# Patient Record
Sex: Female | Born: 1955
Health system: Southern US, Community
[De-identification: ages and names within clinical notes are randomized; demographics above are authoritative.]

## PROBLEM LIST (undated history)

## (undated) DIAGNOSIS — Z889 Allergy status to unspecified drugs, medicaments and biological substances status: Secondary | ICD-10-CM

## (undated) DIAGNOSIS — T7840XA Allergy, unspecified, initial encounter: Secondary | ICD-10-CM

## (undated) DIAGNOSIS — I10 Essential (primary) hypertension: Secondary | ICD-10-CM

## (undated) DIAGNOSIS — H269 Unspecified cataract: Secondary | ICD-10-CM

## (undated) DIAGNOSIS — K649 Unspecified hemorrhoids: Secondary | ICD-10-CM

## (undated) DIAGNOSIS — F32A Depression, unspecified: Secondary | ICD-10-CM

## (undated) DIAGNOSIS — Z79890 Hormone replacement therapy (postmenopausal): Secondary | ICD-10-CM

## (undated) DIAGNOSIS — M1990 Unspecified osteoarthritis, unspecified site: Secondary | ICD-10-CM

## (undated) DIAGNOSIS — F329 Major depressive disorder, single episode, unspecified: Secondary | ICD-10-CM

## (undated) DIAGNOSIS — F419 Anxiety disorder, unspecified: Secondary | ICD-10-CM

## (undated) DIAGNOSIS — M8580 Other specified disorders of bone density and structure, unspecified site: Secondary | ICD-10-CM

## (undated) DIAGNOSIS — K219 Gastro-esophageal reflux disease without esophagitis: Secondary | ICD-10-CM

## (undated) DIAGNOSIS — F40240 Claustrophobia: Secondary | ICD-10-CM

## (undated) HISTORY — DX: Hormone replacement therapy: Z79.890

## (undated) HISTORY — DX: Other specified disorders of bone density and structure, unspecified site: M85.80

## (undated) HISTORY — PX: COLONOSCOPY: SHX174

## (undated) HISTORY — DX: Allergy, unspecified, initial encounter: T78.40XA

## (undated) HISTORY — DX: Anxiety disorder, unspecified: F41.9

## (undated) HISTORY — DX: Unspecified cataract: H26.9

## (undated) HISTORY — DX: Gastro-esophageal reflux disease without esophagitis: K21.9

## (undated) HISTORY — DX: Essential (primary) hypertension: I10

## (undated) HISTORY — DX: Depression, unspecified: F32.A

## (undated) HISTORY — DX: Unspecified osteoarthritis, unspecified site: M19.90

## (undated) HISTORY — PX: POLYPECTOMY: SHX149

## (undated) HISTORY — DX: Major depressive disorder, single episode, unspecified: F32.9

## (undated) HISTORY — DX: Unspecified hemorrhoids: K64.9

## (undated) HISTORY — DX: Claustrophobia: F40.240

## (undated) HISTORY — PX: WISDOM TOOTH EXTRACTION: SHX21

## (undated) HISTORY — DX: Allergy status to unspecified drugs, medicaments and biological substances: Z88.9

---

## 2003-05-31 ENCOUNTER — Encounter: Admission: RE | Admit: 2003-05-31 | Discharge: 2003-05-31 | Payer: Self-pay | Admitting: Family Medicine

## 2003-05-31 ENCOUNTER — Encounter: Payer: Self-pay | Admitting: Family Medicine

## 2004-07-21 ENCOUNTER — Other Ambulatory Visit: Admission: RE | Admit: 2004-07-21 | Discharge: 2004-07-21 | Payer: Self-pay | Admitting: Family Medicine

## 2005-08-17 ENCOUNTER — Other Ambulatory Visit: Admission: RE | Admit: 2005-08-17 | Discharge: 2005-08-17 | Payer: Self-pay | Admitting: Physician Assistant

## 2006-09-12 ENCOUNTER — Ambulatory Visit: Payer: Self-pay | Admitting: Internal Medicine

## 2006-09-20 ENCOUNTER — Other Ambulatory Visit: Admission: RE | Admit: 2006-09-20 | Discharge: 2006-09-20 | Payer: Self-pay | Admitting: Internal Medicine

## 2006-09-20 ENCOUNTER — Ambulatory Visit: Payer: Self-pay | Admitting: Internal Medicine

## 2006-10-10 ENCOUNTER — Ambulatory Visit: Payer: Self-pay | Admitting: Gastroenterology

## 2006-10-21 ENCOUNTER — Encounter (INDEPENDENT_AMBULATORY_CARE_PROVIDER_SITE_OTHER): Payer: Self-pay | Admitting: Specialist

## 2006-10-21 ENCOUNTER — Ambulatory Visit: Payer: Self-pay | Admitting: Gastroenterology

## 2006-10-21 LAB — HM COLONOSCOPY

## 2006-11-29 ENCOUNTER — Ambulatory Visit: Payer: Self-pay | Admitting: Internal Medicine

## 2006-12-30 ENCOUNTER — Encounter: Payer: Self-pay | Admitting: Pulmonary Disease

## 2006-12-30 ENCOUNTER — Ambulatory Visit: Payer: Self-pay | Admitting: Internal Medicine

## 2006-12-30 LAB — CONVERTED CEMR LAB
Alkaline Phosphatase: 43 units/L (ref 39–117)
Anti Nuclear Antibody(ANA): NEGATIVE
BUN: 12 mg/dL (ref 6–23)
Basophils Relative: 0.9 % (ref 0.0–1.0)
Chloride: 106 meq/L (ref 96–112)
Creatinine, Ser: 0.9 mg/dL (ref 0.4–1.2)
Eosinophil percent: 1.4 % (ref 0.0–5.0)
Glomerular Filtration Rate, Af Am: 85 mL/min/{1.73_m2}
Glucose, Bld: 105 mg/dL — ABNORMAL HIGH (ref 70–99)
HCT: 40.2 % (ref 36.0–46.0)
Hemoglobin: 13.6 g/dL (ref 12.0–15.0)
Neutrophils Relative %: 73.3 % (ref 43.0–77.0)
Platelets: 271 10*3/uL (ref 150–400)
Potassium: 3.8 meq/L (ref 3.5–5.1)
RBC: 4.44 M/uL (ref 3.87–5.11)
Sodium: 142 meq/L (ref 135–145)
Total Bilirubin: 0.6 mg/dL (ref 0.3–1.2)
Total Protein: 6.7 g/dL (ref 6.0–8.3)
WBC: 7.5 10*3/uL (ref 4.5–10.5)

## 2007-01-30 ENCOUNTER — Ambulatory Visit: Payer: Self-pay | Admitting: Internal Medicine

## 2007-05-01 ENCOUNTER — Ambulatory Visit: Payer: Self-pay | Admitting: Internal Medicine

## 2007-08-24 DIAGNOSIS — I10 Essential (primary) hypertension: Secondary | ICD-10-CM

## 2007-08-24 DIAGNOSIS — J309 Allergic rhinitis, unspecified: Secondary | ICD-10-CM | POA: Insufficient documentation

## 2007-08-24 DIAGNOSIS — F325 Major depressive disorder, single episode, in full remission: Secondary | ICD-10-CM | POA: Insufficient documentation

## 2008-06-05 ENCOUNTER — Ambulatory Visit: Payer: Self-pay | Admitting: Internal Medicine

## 2008-06-05 LAB — CONVERTED CEMR LAB
ALT: 14 units/L (ref 0–35)
Albumin: 3.8 g/dL (ref 3.5–5.2)
Alkaline Phosphatase: 39 units/L (ref 39–117)
BUN: 11 mg/dL (ref 6–23)
Basophils Absolute: 0 10*3/uL (ref 0.0–0.1)
Bilirubin, Direct: 0.1 mg/dL (ref 0.0–0.3)
Chloride: 109 meq/L (ref 96–112)
Cholesterol: 184 mg/dL (ref 0–200)
Eosinophils Absolute: 0.3 10*3/uL (ref 0.0–0.7)
Glucose, Bld: 83 mg/dL (ref 70–99)
Glucose, Urine, Semiquant: NEGATIVE
HCT: 37.8 % (ref 36.0–46.0)
HDL: 77.5 mg/dL (ref >39.0)
LDL Cholesterol: 98 mg/dL (ref 0–99)
Lymphocytes Relative: 26.1 % (ref 12.0–46.0)
Monocytes Absolute: 0.5 10*3/uL (ref 0.1–1.0)
Neutro Abs: 3.3 10*3/uL (ref 1.4–7.7)
Potassium: 4.7 meq/L (ref 3.5–5.1)
RDW: 13.2 % (ref 11.5–14.6)
Sodium: 143 meq/L (ref 135–145)
Specific Gravity, Urine: 1.015
TSH: 1.79 microintl units/mL (ref 0.35–5.50)
Total Bilirubin: 0.8 mg/dL (ref 0.3–1.2)
Total CHOL/HDL Ratio: 2.4
Total Protein: 6.2 g/dL (ref 6.0–8.3)
Triglycerides: 42 mg/dL (ref 0–149)
Urobilinogen, UA: 0.2
VLDL: 8 mg/dL (ref 0–40)
WBC: 5.5 10*3/uL (ref 4.5–10.5)
pH: 6.5

## 2008-06-13 ENCOUNTER — Ambulatory Visit: Payer: Self-pay | Admitting: Internal Medicine

## 2008-06-13 DIAGNOSIS — Z860100 Personal history of colon polyps, unspecified: Secondary | ICD-10-CM | POA: Insufficient documentation

## 2008-07-01 ENCOUNTER — Ambulatory Visit: Payer: Self-pay | Admitting: Internal Medicine

## 2008-07-01 ENCOUNTER — Other Ambulatory Visit: Admission: RE | Admit: 2008-07-01 | Discharge: 2008-07-01 | Payer: Self-pay | Admitting: Internal Medicine

## 2008-07-01 ENCOUNTER — Encounter: Payer: Self-pay | Admitting: Internal Medicine

## 2008-07-19 LAB — CONVERTED CEMR LAB: Pap Smear: NORMAL

## 2008-08-20 ENCOUNTER — Telehealth: Payer: Self-pay | Admitting: Internal Medicine

## 2008-11-18 ENCOUNTER — Ambulatory Visit: Payer: Self-pay | Admitting: Internal Medicine

## 2009-06-09 ENCOUNTER — Encounter: Payer: Self-pay | Admitting: Internal Medicine

## 2009-07-01 ENCOUNTER — Ambulatory Visit: Payer: Self-pay | Admitting: Internal Medicine

## 2009-07-01 LAB — CONVERTED CEMR LAB
AST: 18 units/L (ref 0–37)
Albumin: 3.8 g/dL (ref 3.5–5.2)
Alkaline Phosphatase: 42 units/L (ref 39–117)
Basophils Relative: 0.8 % (ref 0.0–3.0)
Bilirubin Urine: NEGATIVE
Bilirubin, Direct: 0 mg/dL (ref 0.0–0.3)
CO2: 30 meq/L (ref 19–32)
Eosinophils Absolute: 0.2 10*3/uL (ref 0.0–0.7)
Eosinophils Relative: 5.2 % — ABNORMAL HIGH (ref 0.0–5.0)
HCT: 39 % (ref 36.0–46.0)
HDL: 82 mg/dL (ref >39.00)
Hemoglobin, Urine: NEGATIVE
LDL Cholesterol: 88 mg/dL (ref 0–99)
Leukocytes, UA: NEGATIVE
MCV: 88.7 fL (ref 78.0–100.0)
Monocytes Relative: 9.5 % (ref 3.0–12.0)
Neutro Abs: 2.8 10*3/uL (ref 1.4–7.7)
Neutrophils Relative %: 56.1 % (ref 43.0–77.0)
Platelets: 222 10*3/uL (ref 150.0–400.0)
RBC: 4.4 M/uL (ref 3.87–5.11)
Specific Gravity, Urine: 1.015 (ref 1.000–1.030)
Total Bilirubin: 0.7 mg/dL (ref 0.3–1.2)
Total CHOL/HDL Ratio: 2
Total Protein, Urine: NEGATIVE mg/dL
Total Protein: 6.3 g/dL (ref 6.0–8.3)
Triglycerides: 43 mg/dL (ref 0.0–149.0)
Urine Glucose: NEGATIVE mg/dL
Urobilinogen, UA: 0.2 (ref 0.0–1.0)
VLDL: 8.6 mg/dL (ref 0.0–40.0)
WBC: 4.7 10*3/uL (ref 4.5–10.5)

## 2009-07-08 ENCOUNTER — Ambulatory Visit: Payer: Self-pay | Admitting: Internal Medicine

## 2009-07-08 ENCOUNTER — Encounter: Payer: Self-pay | Admitting: Internal Medicine

## 2009-07-08 ENCOUNTER — Other Ambulatory Visit: Admission: RE | Admit: 2009-07-08 | Discharge: 2009-07-08 | Payer: Self-pay | Admitting: Internal Medicine

## 2009-09-17 ENCOUNTER — Encounter: Admission: RE | Admit: 2009-09-17 | Discharge: 2009-09-17 | Payer: Self-pay | Admitting: Internal Medicine

## 2010-08-18 ENCOUNTER — Ambulatory Visit: Payer: Self-pay | Admitting: Internal Medicine

## 2010-08-26 ENCOUNTER — Encounter: Payer: Self-pay | Admitting: Internal Medicine

## 2010-08-28 ENCOUNTER — Ambulatory Visit: Payer: Self-pay | Admitting: Internal Medicine

## 2010-08-28 LAB — CONVERTED CEMR LAB
ALT: 11 units/L (ref 0–35)
Albumin: 4.1 g/dL (ref 3.5–5.2)
BUN: 15 mg/dL (ref 6–23)
Basophils Relative: 1 % (ref 0.0–3.0)
Bilirubin Urine: NEGATIVE
CO2: 30 meq/L (ref 19–32)
Chloride: 107 meq/L (ref 96–112)
Eosinophils Relative: 8.4 % — ABNORMAL HIGH (ref 0.0–5.0)
HCT: 39.1 % (ref 36.0–46.0)
HDL: 77.9 mg/dL (ref >39.00)
Hemoglobin, Urine: NEGATIVE
LDL Cholesterol: 97 mg/dL (ref 0–99)
Leukocytes, UA: NEGATIVE
Lymphs Abs: 1.7 10*3/uL (ref 0.7–4.0)
MCHC: 34.5 g/dL (ref 30.0–36.0)
MCV: 90.7 fL (ref 78.0–100.0)
Monocytes Absolute: 0.4 10*3/uL (ref 0.1–1.0)
Nitrite: NEGATIVE
Potassium: 5.1 meq/L (ref 3.5–5.1)
RBC: 4.32 M/uL (ref 3.87–5.11)
TSH: 1.66 microintl units/mL (ref 0.35–5.50)
Total CHOL/HDL Ratio: 2
Total Protein: 6.2 g/dL (ref 6.0–8.3)
Triglycerides: 41 mg/dL (ref 0.0–149.0)
Urobilinogen, UA: 0.2 (ref 0.0–1.0)
WBC: 5.4 10*3/uL (ref 4.5–10.5)

## 2010-09-07 ENCOUNTER — Ambulatory Visit: Payer: Self-pay | Admitting: Internal Medicine

## 2010-09-07 ENCOUNTER — Other Ambulatory Visit: Admission: RE | Admit: 2010-09-07 | Discharge: 2010-09-07 | Payer: Self-pay | Admitting: Internal Medicine

## 2010-09-18 ENCOUNTER — Encounter: Admission: RE | Admit: 2010-09-18 | Discharge: 2010-09-18 | Payer: Self-pay | Admitting: Internal Medicine

## 2010-09-18 LAB — HM MAMMOGRAPHY: HM Mammogram: NEGATIVE

## 2010-10-07 ENCOUNTER — Encounter: Payer: Self-pay | Admitting: Internal Medicine

## 2011-01-19 NOTE — Letter (Signed)
Summary: Health Examination Certificate for [facility] Examination Certificate for School   Imported By: Maryln Gottron 09/01/2010 12:35:04  _____________________________________________________________________  External Attachment:    Type:   Image     Comment:   External Document

## 2011-01-19 NOTE — Assessment & Plan Note (Signed)
Summary: b12 inj by 4:30--ok per bonnye//ccm  Nurse Visit   Allergies: 1)  ! Hydrochlorothiazide  Immunizations Administered:  PPD Skin Test:    Vaccine Type: PPD    Site: left forearm    Mfr: Sanofi Pasteur    Dose: 0.1 ml    Route: ID    Given by: Willy Eddy, LPN    Exp. Date: 10/18/2011    Lot #: [id number]  Orders Added: 1)  TB Skin Test [86580] 2)  Admin 1st Vaccine [90471]  Appended Document: b12 inj by 4:30--ok per bonnye//ccm    Clinical Lists Changes  Observations: Added new observation of TB PPDRESULT: negative (08/21/2010 16:27) Added new observation of PPD RESULT: < 5mm (08/21/2010 16:27) Added new observation of TB-PPD RDDTE: 08/21/2010 (08/21/2010 16:27)       PPD Results    Date of reading: 08/21/2010    Results: < 5mm    Interpretation: negative

## 2011-01-19 NOTE — Assessment & Plan Note (Signed)
Summary: cpx/pap/njr   Vital Signs:  Patient profile:   55 year old female Height:      65 inches Weight:      146 pounds BMI:     24.38 Temp:     98.2 degrees F oral Pulse rate:   72 / minute Resp:     14 per minute BP sitting:   126 / 76  (left arm)  Vitals Entered By: Willy Eddy, LPN (September 07, 2010 2:32 PM) CC: cpx and pap, Hypertension Management Is Patient Diabetic? No   CC:  cpx and pap and Hypertension Management.  History of Present Illness: The pt was asked about all immunizations, health maint. services that are appropriate to their age and was given guidance on diet exercize  and weight management   Hypertension History:      She denies headache, chest pain, palpitations, dyspnea with exertion, orthopnea, PND, peripheral edema, visual symptoms, neurologic problems, syncope, and side effects from treatment.        Positive major cardiovascular risk factors include hypertension.  Negative major cardiovascular risk factors include female age less than 51 years old and non-tobacco-user status.     Preventive Screening-Counseling & Management  Alcohol-Tobacco     Smoking Status: never     Tobacco Counseling: not indicated; no tobacco use  Problems Prior to Update: 1)  Colonic Polyps, Hx of  (ICD-V12.72) 2)  Physical Examination  (ICD-V70.0) 3)  Family History Diabetes 1st Degree Relative  (ICD-V18.0) 4)  Family History of Colon Ca 1st Degree Relative <60  (ICD-V16.0) 5)  Depression  (ICD-311) 6)  Hypertension  (ICD-401.9) 7)  Allergic Rhinitis  (ICD-477.9)  Current Problems (verified): 1)  Colonic Polyps, Hx of  (ICD-V12.72) 2)  Physical Examination  (ICD-V70.0) 3)  Family History Diabetes 1st Degree Relative  (ICD-V18.0) 4)  Family History of Colon Ca 1st Degree Relative <60  (ICD-V16.0) 5)  Depression  (ICD-311) 6)  Hypertension  (ICD-401.9) 7)  Allergic Rhinitis  (ICD-477.9)  Medications Prior to Update: 1)  Wellbutrin Xl 150 Mg  Tb24  (Bupropion Hcl) .... Once Daily 2)  Cozaar 100 Mg Tabs (Losartan Potassium) .Marland Kitchen.. 1 Once Daily  Current Medications (verified): 1)  Cozaar 100 Mg Tabs (Losartan Potassium) .Marland Kitchen.. 1 Once Daily 2)  Aspir-Low 81 Mg Tbec (Aspirin) .... One By Mouth Dailly 3)  Calcium-Vitamin D 600-200 Mg-Unit Tabs (Calcium-Vitamin D) .... One By Mouth Two Times A Day 4)  Glucosamine-Chondroitin Ds 500-400 Mg Tabs (Glucosamine-Chondroitin) .... One By Mouth Two Times A Day As Needed  Allergies (verified): 1)  ! Hydrochlorothiazide  Past History:  Family History: Last updated: 08/24/2007 Family History of Arthritis Family History of Colon CA 1st degree relative <60 Family History Diabetes 1st degree relative Family History High cholesterol Family History Hypertension Family History of Prostate CA 1st degree relative <50 Family History Psychiatric care Family History of Sudden Death Family History of Cardiovascular disorder  Social History: Last updated: 08/24/2007 Occupation: Married Never Smoked Alcohol use-yes Drug use-no Regular exercise-yes  Risk Factors: Exercise: yes (08/24/2007)  Risk Factors: Smoking Status: never (09/07/2010)  Past medical, surgical, family and social histories (including risk factors) reviewed, and no changes noted (except as noted below).  Past Medical History: Reviewed history from 06/13/2008 and no changes required. HRT Allergic rhinitis Hypertension Allergies UTIs Depression Colonic polyps, hx of  Past Surgical History: Reviewed history from 06/13/2008 and no changes required. Caesarean section Colon polypectomy  Family History: Reviewed history from 08/24/2007 and no changes required.  Family History of Arthritis Family History of Colon CA 1st degree relative <60 Family History Diabetes 1st degree relative Family History High cholesterol Family History Hypertension Family History of Prostate CA 1st degree relative <50 Family History Psychiatric  care Family History of Sudden Death Family History of Cardiovascular disorder  Social History: Reviewed history from 08/24/2007 and no changes required. Occupation: Married Never Smoked Alcohol use-yes Drug use-no Regular exercise-yes  Review of Systems  The patient denies anorexia, fever, weight loss, weight gain, vision loss, decreased hearing, hoarseness, chest pain, syncope, dyspnea on exertion, peripheral edema, prolonged cough, headaches, hemoptysis, abdominal pain, melena, hematochezia, severe indigestion/heartburn, hematuria, incontinence, genital sores, muscle weakness, suspicious skin lesions, transient blindness, difficulty walking, depression, unusual weight change, abnormal bleeding, enlarged lymph nodes, angioedema, and breast masses.         Flu Vaccine Consent Questions     Do you have a history of severe allergic reactions to this vaccine? no    Any prior history of allergic reactions to egg and/or gelatin? no    Do you have a sensitivity to the preservative Thimersol? no    Do you have a past history of Guillan-Barre Syndrome? no    Do you currently have an acute febrile illness? no    Have you ever had a severe reaction to latex? no    Vaccine information given and explained to patient? yes    Are you currently pregnant? no    Lot Number:[id number]   Exp Date:06/19/2011   Site Given  Left Deltoid IM    Impression & Recommendations:  Problem # 1:  PHYSICAL EXAMINATION (ICD-V70.0) Assessment Unchanged The pt was asked about all immunizations, health maint. services that are appropriate to their age and was given guidance on diet exercize  and weight management  Mammogram: ASSESSMENT: Negative - BI-RADS 1^MM DIGITAL SCREENING (09/17/2009) Pap smear: NEGATIVE FOR INTRAEPITHELIAL LESIONS OR MALIGNANCY. (07/08/2009) Colonoscopy: Adenomatous Polyp (10/21/2006) Td Booster: Historical (12/20/2000)   Flu Vax: Fluvax 3+ (09/07/2010)   Chol: 183 (08/28/2010)   HDL:  77.90 (08/28/2010)   LDL: 97 (08/28/2010)   TG: 41.0 (08/28/2010) TSH: 1.66 (08/28/2010)   Next mammogram due:: 08/2009 (07/08/2009)  Discussed using sunscreen, use of alcohol, drug use, self breast exam, routine dental care, routine eye care, schedule for GYN exam, routine physical exam, seat belts, multiple vitamins, osteoporosis prevention, adequate calcium intake in diet, recommendations for immunizations, mammograms and Pap smears.  Discussed exercise and checking cholesterol.  Discussed gun safety, safe sex, and contraception.  Complete Medication List: 1)  Cozaar 100 Mg Tabs (Losartan potassium) .Marland Kitchen.. 1 once daily 2)  Aspir-low 81 Mg Tbec (Aspirin) .... One by mouth dailly 3)  Calcium-vitamin D 600-200 Mg-unit Tabs (Calcium-vitamin d) .... One by mouth two times a day 4)  Glucosamine-chondroitin Ds 500-400 Mg Tabs (Glucosamine-chondroitin) .... One by mouth two times a day as needed  Other Orders: Admin 1st Vaccine (66440) Flu Vaccine 47yrs + (34742)  Hypertension Assessment/Plan:      The patient's hypertensive risk group is category A: No risk factors and no target organ damage.  Her calculated 10 year risk of coronary heart disease is 3 %.  Today's blood pressure is 126/76.  Her blood pressure goal is < 140/90.  Patient Instructions: 1)  Please schedule a follow-up appointment in 1 year. CPX

## 2011-04-07 ENCOUNTER — Ambulatory Visit (INDEPENDENT_AMBULATORY_CARE_PROVIDER_SITE_OTHER): Payer: BLUE CROSS/BLUE SHIELD | Admitting: Family Medicine

## 2011-04-07 ENCOUNTER — Encounter: Payer: Self-pay | Admitting: Family Medicine

## 2011-04-07 VITALS — BP 148/90 | Temp 98.0°F | Ht 65.75 in | Wt 142.0 lb

## 2011-04-07 DIAGNOSIS — L820 Inflamed seborrheic keratosis: Secondary | ICD-10-CM

## 2011-04-07 NOTE — Progress Notes (Signed)
  Subjective:    Patient ID: Shauniece Medina, female    DOB: 02-10-1956, 55 y.o.   MRN: [id number]  HPI Patient seen with irritated skin lesion left anterior shoulder. Present for several years possibly. Possibly irritated by rubbing against her bra strap. No personal or family history of skin cancer. Skin lesion question slightly pruritic. No bleeding. Nonpainful.  Dealing with lots of stress from husband's health problems over the past year.   Review of Systems  Constitutional: Negative for appetite change and unexpected weight change.  Skin: Negative for rash.  Hematological: Negative for adenopathy.       Objective:   Physical Exam  Constitutional: She appears well-developed and well-nourished.  Cardiovascular: Normal rate and regular rhythm.   Skin:       Patient has well demarcated, symmetric 5 x 7 mm scaly slightly brownish skin lesion left anterior shoulder. No ulceration. No nodular qualities          Assessment & Plan:  Skin lesion left anterior shoulder. Suspect seborrheic keratosis which is irritated. Reviewed options with patient including shave excision or cryotherapy and she elected the latter after discussion of risks and benefits. Treated without difficulty. Followup with primary physician in 2-3 weeks if this is not resolving with cryotherapy

## 2011-04-07 NOTE — Patient Instructions (Signed)
Follow up if skin lesion is not resolving in 2-3 weeks.

## 2011-04-29 ENCOUNTER — Other Ambulatory Visit: Payer: Self-pay | Admitting: Internal Medicine

## 2011-05-04 NOTE — Assessment & Plan Note (Signed)
[facility]                             PULMONARY OFFICE NOTE   NAME:VANBURENSerenity, Medina                       MRN:          [id number]  DATE:05/01/2007                            DOB:          [birth year]    PROBLEM:  Urticaria.   HISTORY:  She stopped all of her medications after last here, and the  hives cleared by mid April without return.  We discussed likeliest  contributors, and I am particularly suspicious that it was the  hydrochlorothiazide.  She is watching to see how she feels off of her  Budeprion, her Lutera, and atenolol.  She has not had significant pollen  rhinitis.   OBJECTIVE:  Weight 144 pounds.  BP is up to 152/94, pulse 88, room air  saturation 98%.  There is no rash, no adenopathy.  She looks quite comfortable.   IMPRESSION:  1. Urticaria, resolved.  The most likely trigger was      hydrochlorothiazide.  2. Hypertension, off of therapy.   PLAN:  I have asked her to return to Dr. Lovell Sheehan for blood pressure  management.  If possible, I would like to keep her off of  hydrochlorothiazide and angiotensin-converting enzyme inhibitors.  Medications should be added back one at a time at 3-week intervals,  watching to see if urticaria is again triggered.  Meantime, I will see  her again p.r.n.     Clinton D. Maple Hudson, MD, Sharon Medina, FACP  Electronically Signed    CDY/MedQ  DD: 05/01/2007  DT: 05/02/2007  Job #: [id number]   cc:   Stacie Glaze, MD

## 2011-05-07 NOTE — Assessment & Plan Note (Signed)
[facility]                             PULMONARY OFFICE NOTE   NAME:Sharon Medina, Sharon Medina                       MRN:          [id number]  DATE:12/30/2006                            DOB:          04/09/1956    PULMONARY/ALLERGY CONSULTATION   PROBLEM:  [age]-year-old woman seen in allergy consultation at the kind  request of Dr. Lovell Sheehan because of a rash.   HISTORY:  Over the past year she has been troubled with patchy areas of  pruritic rash which shift and involve various areas of the body  including non exposed and non pressure surfaces. This pops up quickly  and tends to fade in a few hours. She thinks it may speed clearing to  apply a topical cortisone. 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  Xyzal helps but makes her sleepy so she avoids using it. She has no  prior similar experience except that she is very sensitive to poison  ivy. She has noted some tendency for this to persist in her pubic area  but she says it is not a yeast infection. She has been using birth  control pills for menopause, hydrochlorothiazide and Wellbutrin for  about a year and we discussed possible relationship with these drugs,  especially estrogens or hydrochlorothiazide.   MEDICATIONS:  Multivitamins, Glucosamine 1500 mg b.i.d. with Chondroitin  1200 mg b.i.d., calcium with vitamin D 500 mg b.i.d., Atenolol 25 mg,  hydrochlorothiazide 25 mg, Wellbutrin SR 150 mg, Lutara-28 1 daily,  p.r.n. use of Xyzal 5 mg or Lotrimin.   ALLERGIES:  No medication intolerance. Specifically including latex,  contrast or iodine, or aspirin.   REVIEW OF SYSTEMS:  She notices mild seasonal rhinitis with nasal  congestion and watery rhinorrhea, particularly in the spring more than  fall. No wheezing, no purulent discharge, no fever, adenopathy, joint  pain or edema. She does have some history of depression. Large local  reaction to mosquito bites.   PAST HISTORY:  1. Hypertension.  2. Allergic rhinitis.  3. Eczema as a child.  4. Cesarean section x2.  5. No history of asthma or unusual reactions to foods.   SOCIAL HISTORY:  One glass of wine a day. She is married with children.  Works as a Engineer, site.   FAMILY HISTORY:  Some with seasonal allergies, cancer.   OBJECTIVE:  Weight 145 pounds. Blood pressure 128/84, pulse regular 81,  room air saturation 99%.  GENERAL: Well-developed, well-nourished, apparently comfortable woman,  blonde non-colorized hair. Dry skin.  ADENOPATHY: None.  HEENT: Conjunctivae are clear. Nasal mucosa is normal. Oropharynx is  clear. Speech quality normal. There is no thyromegaly.  CHEST: Quiet clear lung fields without cough or wheeze.  HEART: Regular heart sounds, normal.  ABDOMEN: No enlargement of liver or spleen.  EXTREMITIES: No clubbing, cyanosis, edema or tremor.   IMPRESSION:  Shifting erythematous pruritic lesions that come and go  rather quickly would be consistent with urticaria. The time course  suggests the possibility of relationship to one of her medications and I  have suggested that her risk of complications from untreated blood  pressure  would be unlikely to be serious if she were to stay off of  hydrochlorothiazide for a few weeks to assess response. We can  sequentially try taking her off of her birth control pills and her  Wellbutrin depending on how she does off of hydrochlorothiazide but I  think that is the most high yield direction to try first.   PLAN:  1. Stop hydrochlorothiazide.  2. She is going to schedule return off of antihistamines for allergy      skin testing and we will look at her blood pressure on return.   I appreciate the chance to see her.     Clinton D. Maple Hudson, MD, Tonny Bollman, FACP  Electronically Signed    CDY/MedQ  DD: 12/30/2006  DT: 12/31/2006  Job #: [id number]

## 2011-05-07 NOTE — Assessment & Plan Note (Signed)
[facility]                                 ON-CALL NOTE   NAME:[name], Sharon Medina                       MRN:          [id number]  DATE:12/20/2007                            DOB:          Apr 22, 1956    Date of interaction December 20, 2007, at 3:52 p.m.  Phone number is [phone].   OBJECTIVE:  The patient had some blood in her urine.  She is passing  blood and having frequent urination and wanted an antibiotic called in  for probable urinary infection.  I told her we do not call in  antibiotics over the phone and she needs to go to Urgent Care to be  assessed.   Primary care Tyrhonda Georgiades is Dr. Lovell Sheehan and home office is [place].     Arta Silence, MD  Electronically Signed    RNS/MedQ  DD: 12/21/2007  DT: 12/21/2007  Job #: [id number]

## 2011-05-07 NOTE — Assessment & Plan Note (Signed)
[facility]                             PULMONARY OFFICE NOTE   NAME:VANBURENMurle, Hellstrom                       MRN:          [id number]  DATE:01/30/2007                            DOB:          May 24, 1956    PROBLEM:  Urticaria.   HISTORY:  She returns today off of antihistamines for skin testing. She  had been taking Wellbutrin for anxiety and hypertension, and we  discussed this. Aldean Ast is for dysmenorrhea. She is not having hives  currently and feels well.   MEDICATIONS:  1. Multivitamin.  2. Glucosamine 1500 mg b.i.d.  3. Chondroitin 1200 mg b.i.d.  4. Calcium with Vitamin D.  5. Actonel 25 mg.  6. Hydrochlorothiazide 25 mg.  7. Budeprion SR 150 mg.  8. Lutera-28.  9. She had samples of Xyzal and used Lotrimin p.r.n.Marland Kitchen   ALLERGIES:  No medication allergy.   LAB:  Negative ANA, negative food RAST screen including egg white, milk,  wheat, soybean, peanut, and fish. Her white blood count was 7500.  Eosinophils were not elevated at 1.4%. Chemistry was remarkable for a  serum CO2 of 33 and a random glucose of 105. Liver enzymes were normal.   OBJECTIVE:  Weight 144 pounds, blood pressure 122/68, pulse regular 79,  room air saturation 99%. Quiet, clear chest. Regular heart sounds. No  obvious rash.   SKIN TEST:  Positive histamine and negative diluent controls. Negative  puncture test. Positive intradermal reactions particularly for grass,  fall weed, tree pollens, dust mite, feather, and cat. There were no food  reactions.   IMPRESSION:  Frequency of hives did not change while she was off of  hydrochlorothiazide for observation. We discussed the possibility based  on skin test response that there was a related allergen aggravating  factor, but I favor drug induced urticaria still.   PLAN:  She can restart hydrochlorothiazide, which had been discontinued  a month ago. I have asked her to discontinue Lutera. She is willing to  do this for one  month, accepting  dysmenorrhea for that observation interval. If she is not any better in  six weeks, she will taper off of Wellbutrin. Schedule return in three  months, earlier p.r.n.     Clinton D. Maple Hudson, MD, Tonny Bollman, FACP  Electronically Signed    CDY/MedQ  DD: 02/05/2007  DT: 02/06/2007  Job #: [id number]   cc:   Stacie Glaze, MD

## 2011-05-07 NOTE — Assessment & Plan Note (Signed)
[facility]                             PULMONARY OFFICE NOTE   NAME:VANBURENBeckey, Polkowski                       MRN:          [id number]  DATE:12/30/2006                            DOB:          [birth year]    PROBLEM:  Allergy consultation at the kind request of Dr. Lovell Sheehan for  this 55 year old woman concerned with a rash.   HISTORY:  She describes a rash which has come and gone over the past  year.  She thinks it might be allergic.  She describes a shift in  location with tendency for rash to develop where skin is warm.  It is  not related to sun exposure.  Involved areas come on rather quickly with  itching and seemed to fade faster if she applies topical cortisone  cream.  Usually episodes last only a few hours at any one location.  Xyzal helps but makes her sleepy.  She is aware of Dermographism.  Her  trunk and groin areas have been most involved but she does not recognize  a relationship to elastic bands.  She has been on birth control pills  for menopausal symptoms for the past year as well as HCTZ and Wellbutrin  and we discussed possible medication triggers.  She is incidentally very  sensitive to poison ivy requiring cortisone but otherwise has had mild  hay fever, childhood eczema and no history of asthma and no history of  recognized foot intolerance or problems with latex, iodine or contrast  agents or aspirin.  She has never had anything similar in the past.   MEDICATIONS:  Multivitamin, glucosamine, chondroitin, calcium with  vitamin D, Atenolol 25 mg, HCTZ 25 mg, Wellbutrin SR 150 mg, Lutera-28  (BCP), p.r.n. use of Xyzal 5 mg and Lotrimin.   No medication allergy.   REVIEW OF SYSTEMS:  Large local reaction to mosquito bite, itching,  anxiety, depression.  No cough or wheeze.  No adenopathy.  No other  rash.  No joint pain.  No bleeding, fever, weight loss, nausea, vomiting  or diarrhea.   PAST MEDICAL HISTORY:  1. Mild hay fever.  2. Childhood eczema.  3. Hypertension.  4. Two C-sections.   SOCIAL HISTORY:  Never smoked.  Drinks a glass of wine a day.  Married  with children.  Works as a Runner, broadcasting/film/video.   FAMILY HISTORY:  History of allergies and of cancer.   OBJECTIVE:  Weight 145 pounds, BP 128/84, pulse regular 81, room air  saturation 99%.  Dry skin without visible rash currently.  No adenopathy  found.  Conjunctivae, nasal mucosa and oropharynx clear.  Lungs clear to  P&A.  Abdomen soft without hepatosplenomegaly.  Heart sounds regular  without murmur.  No clubbing, cyanosis, or edema.   IMPRESSION:  Urticaria:  From the timing, it would be most likely  related either to HCTZ or to her birth control pills.   PLAN:  1. She is first going to try off of HCTZ for a month, anticipating      that her blood pressure will tolerate that long a period of  observation.  2. She is going to return for allergy skin testing with particular      question of whether there is a component of atopic dermatitis.  3. Our next choice then would be to resume HCTZ and drop her off of      her hormones.  I appreciate the chance to see her.     Clinton D. Maple Hudson, MD, Tonny Bollman, FACP  Electronically Signed    CDY/MedQ  DD: 01/08/2007  DT: 01/08/2007  Job #: [id number]   cc:   Stacie Glaze, MD

## 2011-06-30 ENCOUNTER — Other Ambulatory Visit: Payer: Self-pay | Admitting: Internal Medicine

## 2011-06-30 DIAGNOSIS — Z1231 Encounter for screening mammogram for malignant neoplasm of breast: Secondary | ICD-10-CM

## 2011-09-03 ENCOUNTER — Other Ambulatory Visit (INDEPENDENT_AMBULATORY_CARE_PROVIDER_SITE_OTHER): Payer: BC Managed Care – PPO

## 2011-09-03 DIAGNOSIS — Z0000 Encounter for general adult medical examination without abnormal findings: Secondary | ICD-10-CM

## 2011-09-03 LAB — BASIC METABOLIC PANEL
CO2: 29 mEq/L (ref 19–32)
Calcium: 9.1 mg/dL (ref 8.4–10.5)
GFR: 85.19 mL/min (ref >60.00)
Potassium: 4.2 mEq/L (ref 3.5–5.1)
Sodium: 141 mEq/L (ref 135–145)

## 2011-09-03 LAB — HEPATIC FUNCTION PANEL
AST: 21 U/L (ref 0–37)
Total Bilirubin: 0.7 mg/dL (ref 0.3–1.2)

## 2011-09-03 LAB — POCT URINALYSIS DIPSTICK
Ketones, UA: NEGATIVE
Protein, UA: NEGATIVE
Spec Grav, UA: 1.02
Urobilinogen, UA: 0.2
pH, UA: 7

## 2011-09-03 LAB — CBC WITH DIFFERENTIAL/PLATELET
Basophils Absolute: 0.1 10*3/uL (ref 0.0–0.1)
Eosinophils Absolute: 0.3 10*3/uL (ref 0.0–0.7)
HCT: 37.9 % (ref 36.0–46.0)
Lymphocytes Relative: 32.6 % (ref 12.0–46.0)
Lymphs Abs: 1.6 10*3/uL (ref 0.7–4.0)
MCHC: 33.4 g/dL (ref 30.0–36.0)
Monocytes Relative: 6.7 % (ref 3.0–12.0)
Platelets: 234 10*3/uL (ref 150.0–400.0)
RDW: 14.5 % (ref 11.5–14.6)

## 2011-09-03 LAB — TSH: TSH: 1.77 u[IU]/mL (ref 0.35–5.50)

## 2011-09-03 LAB — LIPID PANEL
Cholesterol: 179 mg/dL (ref 0–200)
HDL: 88.3 mg/dL (ref >39.00)
LDL Cholesterol: 83 mg/dL (ref 0–99)
VLDL: 8 mg/dL (ref 0.0–40.0)

## 2011-09-10 ENCOUNTER — Encounter: Payer: Self-pay | Admitting: Internal Medicine

## 2011-09-10 ENCOUNTER — Other Ambulatory Visit: Payer: Self-pay | Admitting: *Deleted

## 2011-09-10 ENCOUNTER — Ambulatory Visit (INDEPENDENT_AMBULATORY_CARE_PROVIDER_SITE_OTHER): Payer: BC Managed Care – PPO | Admitting: Internal Medicine

## 2011-09-10 ENCOUNTER — Other Ambulatory Visit (HOSPITAL_COMMUNITY)
Admission: RE | Admit: 2011-09-10 | Discharge: 2011-09-10 | Disposition: A | Discharge status: Home / Self Care | Payer: BC Managed Care – PPO | Source: Ambulatory Visit | Attending: Internal Medicine | Admitting: Internal Medicine

## 2011-09-10 VITALS — BP 130/84 | HR 76 | Temp 98.2°F | Resp 16 | Ht 65.75 in | Wt 144.0 lb

## 2011-09-10 DIAGNOSIS — Z23 Encounter for immunization: Secondary | ICD-10-CM

## 2011-09-10 DIAGNOSIS — N959 Unspecified menopausal and perimenopausal disorder: Secondary | ICD-10-CM

## 2011-09-10 DIAGNOSIS — Z0000 Encounter for general adult medical examination without abnormal findings: Secondary | ICD-10-CM

## 2011-09-10 DIAGNOSIS — I10 Essential (primary) hypertension: Secondary | ICD-10-CM

## 2011-09-10 DIAGNOSIS — Z01419 Encounter for gynecological examination (general) (routine) without abnormal findings: Secondary | ICD-10-CM | POA: Insufficient documentation

## 2011-09-10 NOTE — Progress Notes (Signed)
Subjective:    Patient ID: Sharon Medina, female    DOB: 1956/07/25, 55 y.o.   MRN: [id number]  HPI  cpx Patient does note change in menstrual cycles intermittent menstrual periods some cramping some vasomotor symptoms with night sweats.  She was on black cohosh but stopped it recently  Review of Systems  Constitutional: Negative for activity change, appetite change and fatigue.  HENT: Negative for ear pain, congestion, neck pain, postnasal drip and sinus pressure.   Eyes: Negative for redness and visual disturbance.  Respiratory: Negative for cough, shortness of breath and wheezing.   Gastrointestinal: Negative for abdominal pain and abdominal distention.  Genitourinary: Negative for dysuria, frequency and menstrual problem.  Musculoskeletal: Negative for myalgias, joint swelling and arthralgias.  Skin: Negative for rash and wound.  Neurological: Negative for dizziness, weakness and headaches.  Hematological: Negative for adenopathy. Does not bruise/bleed easily.  Psychiatric/Behavioral: Negative for sleep disturbance and decreased concentration.   Past Medical History  Diagnosis Date  . Postmenopausal HRT (hormone replacement therapy)   . Allergy     allegic rhinitis  . Hypertension   . Depression   . History of colon polyps    No past surgical history on file.  reports that she has never smoked. She does not have any smokeless tobacco history on file. She reports that she drinks alcohol. She reports that she does not use illicit drugs. family history is not on file. Allergies  Allergen Reactions  . Hydrochlorothiazide     REACTION: rash       Objective:   Physical Exam  Nursing note and vitals reviewed. Constitutional: She is oriented to person, place, and time. She appears well-developed and well-nourished. No distress.  HENT:  Head: Normocephalic and atraumatic.  Right Ear: External ear normal.  Left Ear: External ear normal.  Nose: Nose normal.  Mouth/Throat:  Oropharynx is clear and moist.  Eyes: Conjunctivae and EOM are normal. Pupils are equal, round, and reactive to light.  Neck: Normal range of motion. Neck supple. No JVD present. No tracheal deviation present. No thyromegaly present.  Cardiovascular: Normal rate, regular rhythm, normal heart sounds and intact distal pulses.   No murmur heard. Pulmonary/Chest: Effort normal and breath sounds normal. She has no wheezes. She exhibits no tenderness.  Abdominal: Soft. Bowel sounds are normal.  Musculoskeletal: Normal range of motion. She exhibits no edema and no tenderness.  Lymphadenopathy:    She has no cervical adenopathy.  Neurological: She is alert and oriented to person, place, and time. She has normal reflexes. No cranial nerve deficit.  Skin: Skin is warm and dry. She is not diaphoretic.  Psychiatric: She has a normal mood and affect. Her behavior is normal.          Assessment & Plan:   This is a routine physical examination for this healthy  Female. Reviewed all health maintenance protocols including mammography colonoscopy bone density and reviewed appropriate screening labs. Her immunization history was reviewed as well as her current medications and allergies refills of her chronic medications were given and the plan for yearly health maintenance was discussed all orders and referrals were made as appropriate. Patient is experiencing menopausal symptomology with vasomotor symptoms of night sweats and irregular menstrual cycles we discussed the use of black cohosh as a therapeutic option rather than hormonal therapy.  Her blood pressure is stable on her current medications she is doing well and tolerating them without any difficulty he is noticing some mood fluctuation with her cycle fluctuation

## 2011-09-10 NOTE — Progress Notes (Signed)
Addended by: Willy Eddy on: 09/10/2011 03:57 PM   Modules accepted: Orders

## 2011-09-21 ENCOUNTER — Ambulatory Visit
Admission: RE | Admit: 2011-09-21 | Discharge: 2011-09-21 | Disposition: A | Discharge status: Home / Self Care | Payer: BC Managed Care – PPO | Source: Ambulatory Visit | Attending: Internal Medicine | Admitting: Internal Medicine

## 2011-09-21 DIAGNOSIS — Z1231 Encounter for screening mammogram for malignant neoplasm of breast: Secondary | ICD-10-CM

## 2011-11-05 ENCOUNTER — Other Ambulatory Visit: Payer: Self-pay | Admitting: Internal Medicine

## 2012-03-09 ENCOUNTER — Encounter: Payer: Self-pay | Admitting: Internal Medicine

## 2012-03-09 ENCOUNTER — Ambulatory Visit (INDEPENDENT_AMBULATORY_CARE_PROVIDER_SITE_OTHER): Payer: BC Managed Care – PPO | Admitting: Internal Medicine

## 2012-03-09 VITALS — BP 130/80 | HR 80 | Temp 98.4°F | Resp 16 | Ht 66.5 in | Wt 142.0 lb

## 2012-03-09 DIAGNOSIS — N959 Unspecified menopausal and perimenopausal disorder: Secondary | ICD-10-CM

## 2012-03-09 DIAGNOSIS — R252 Cramp and spasm: Secondary | ICD-10-CM

## 2012-03-09 DIAGNOSIS — F0630 Mood disorder due to known physiological condition, unspecified: Secondary | ICD-10-CM

## 2012-03-09 LAB — BASIC METABOLIC PANEL
BUN: 19 mg/dL (ref 6–23)
CO2: 27 mEq/L (ref 19–32)
Chloride: 100 mEq/L (ref 96–112)
Creatinine, Ser: 0.8 mg/dL (ref 0.4–1.2)
Glucose, Bld: 88 mg/dL (ref 70–99)
Potassium: 4.8 mEq/L (ref 3.5–5.1)

## 2012-03-09 MED ORDER — DESVENLAFAXINE SUCCINATE ER 50 MG PO TB24: 50.0000 mg | ORAL_TABLET | Freq: Every day | ORAL | Status: DC

## 2012-03-09 NOTE — Progress Notes (Signed)
Subjective:    Patient ID: Sharon Medina, female    DOB: 06-01-1956, 56 y.o.   MRN: [id number]  HPI Foot pain . PMMD and HTN follow up. Patient's hypertension has been well-controlled on current medications. She is tolerating the Cozaar without side effects  Patient does have significant perimenopausal mood disorder has not responded well to her current antidepressant therapy She has hyperlipidemia which is treated with a combination of diet exercise and flaxseed oils. She takes glucosamine/chondroitin for joint pain and has a chief complaint of persistent foot pain and cramping primarily in her left foot   Review of Systems  Constitutional: Negative for activity change, appetite change and fatigue.  HENT: Negative for ear pain, congestion, neck pain, postnasal drip and sinus pressure.   Eyes: Negative for redness and visual disturbance.  Respiratory: Negative for cough, shortness of breath and wheezing.   Gastrointestinal: Negative for abdominal pain and abdominal distention.  Genitourinary: Negative for dysuria, frequency and menstrual problem.  Musculoskeletal: Negative for myalgias, joint swelling and arthralgias.  Skin: Negative for rash and wound.  Neurological: Negative for dizziness, weakness and headaches.  Hematological: Negative for adenopathy. Does not bruise/bleed easily.  Psychiatric/Behavioral: Negative for sleep disturbance and decreased concentration.   Past Medical History  Diagnosis Date  . Postmenopausal HRT (hormone replacement therapy)   . Allergy     allegic rhinitis  . Hypertension   . Depression   . History of colon polyps     History   Social History  . Marital Status: Married    Spouse Name: N/A    Number of Children: N/A  . Years of Education: N/A   Occupational History  . Not on file.   Social History Main Topics  . Smoking status: Never Smoker   . Smokeless tobacco: Not on file  . Alcohol Use: Yes  . Drug Use: No  . Sexually Active:      Other Topics Concern  . Not on file   Social History Narrative  . No narrative on file    No past surgical history on file.  No family history on file.  Allergies  Allergen Reactions  . Hydrochlorothiazide     REACTION: rash    Current Outpatient Prescriptions on File Prior to Visit  Medication Sig Dispense Refill  . aspirin 81 MG tablet Take 81 mg by mouth daily.        . Calcium Carbonate-Vitamin D (CALCIUM-VITAMIN D) 600-200 MG-UNIT CAPS Take by mouth daily.        Marland Kitchen glucosamine-chondroitin 500-400 MG tablet Take 1 tablet by mouth 2 (two) times daily as needed.        Marland Kitchen losartan (COZAAR) 100 MG tablet TAKE 1 TABLET BY MOUTH ONCE DAILY  30 tablet  5    BP 130/80  Pulse 80  Temp 98.4 F (36.9 C)  Resp 16  Ht 5' 6.5" (1.689 m)  Wt 142 lb (64.411 kg)  BMI 22.58 kg/m2       Objective:   Physical Exam  Constitutional: She is oriented to person, place, and time. She appears well-developed and well-nourished. No distress.  HENT:  Head: Normocephalic and atraumatic.  Right Ear: External ear normal.  Left Ear: External ear normal.  Nose: Nose normal.  Mouth/Throat: Oropharynx is clear and moist.  Eyes: Conjunctivae and EOM are normal. Pupils are equal, round, and reactive to light.  Neck: Normal range of motion. Neck supple. No JVD present. No tracheal deviation present. No thyromegaly present.  Cardiovascular: Normal  rate, regular rhythm, normal heart sounds and intact distal pulses.   No murmur heard. Pulmonary/Chest: Effort normal and breath sounds normal. She has no wheezes. She exhibits no tenderness.  Abdominal: Soft. Bowel sounds are normal.  Musculoskeletal: Normal range of motion. She exhibits no edema and no tenderness.  Lymphadenopathy:    She has no cervical adenopathy.  Neurological: She is alert and oriented to person, place, and time. She has normal reflexes. No cranial nerve deficit.  Skin: Skin is warm and dry. She is not diaphoretic.   Psychiatric: She has a normal mood and affect. Her behavior is normal.          Assessment & Plan:  I believe she has not responded well to the Celexa and we discussed either adding Wellbutrin were changing her to pristiq. After a long discussion we decided together that pristiq would be a better fit for her for a trough for the next 4 weeks samples were given and she was informed of the potential risks side effects and benefits of the drug. She will monitor her affect and/or side effects from this medication or report back to [place] within 2-3 weeks otherwise she will followup as scheduled.  Blood pressure is stable and monitoring of her lipids will be pursued at her yearly examination She has foot cramps and foot pain that are most probably consistent with plantar fasciitis.  We talked her foot stretches and discussed the use of magnesium as a muscle relaxant for the feet.

## 2012-03-09 NOTE — Patient Instructions (Addendum)
The patient is instructed to continue all medications as prescribed. Schedule followup with check out clerk upon leaving the clinic Foot stretches pristiq And topical use of pennsaid

## 2012-04-10 ENCOUNTER — Ambulatory Visit (INDEPENDENT_AMBULATORY_CARE_PROVIDER_SITE_OTHER): Payer: BC Managed Care – PPO | Admitting: Internal Medicine

## 2012-04-10 DIAGNOSIS — Z0000 Encounter for general adult medical examination without abnormal findings: Secondary | ICD-10-CM

## 2012-04-12 ENCOUNTER — Encounter: Payer: Self-pay | Admitting: Internal Medicine

## 2012-04-12 ENCOUNTER — Ambulatory Visit (INDEPENDENT_AMBULATORY_CARE_PROVIDER_SITE_OTHER): Payer: BC Managed Care – PPO | Admitting: Internal Medicine

## 2012-04-12 VITALS — BP 132/86 | HR 68 | Temp 98.2°F | Resp 16 | Ht 66.0 in | Wt 140.0 lb

## 2012-04-12 DIAGNOSIS — E785 Hyperlipidemia, unspecified: Secondary | ICD-10-CM

## 2012-04-12 DIAGNOSIS — T887XXA Unspecified adverse effect of drug or medicament, initial encounter: Secondary | ICD-10-CM

## 2012-04-12 DIAGNOSIS — F39 Unspecified mood [affective] disorder: Secondary | ICD-10-CM

## 2012-04-12 DIAGNOSIS — M2550 Pain in unspecified joint: Secondary | ICD-10-CM

## 2012-04-12 MED ORDER — CITALOPRAM HYDROBROMIDE 20 MG PO TABS: 20.0000 mg | ORAL_TABLET | Freq: Every day | ORAL | Status: DC

## 2012-04-12 MED ORDER — BUPROPION HCL ER (XL) 150 MG PO TB24: 150.0000 mg | ORAL_TABLET | Freq: Every day | ORAL | Status: DC

## 2012-04-12 NOTE — Progress Notes (Signed)
Subjective:    Patient ID: Sharon Medina, female    DOB: [birth year], 56 y.o.   MRN: [id number]  HPI Could not tolerlate the pristiq Visit she can deal with her depression through active therapy exercise and change in medications we discussed the use of Wellbutrin which should be energizing and help with focus.  She also has hypertension and her blood pressure stable his heart   Review of Systems  Constitutional: Negative for activity change, appetite change and fatigue.  HENT: Negative for ear pain, congestion, neck pain, postnasal drip and sinus pressure.   Eyes: Negative for redness and visual disturbance.  Respiratory: Negative for cough, shortness of breath and wheezing.   Gastrointestinal: Negative for abdominal pain and abdominal distention.  Genitourinary: Negative for dysuria, frequency and menstrual problem.  Musculoskeletal: Negative for myalgias, joint swelling and arthralgias.  Skin: Negative for rash and wound.  Neurological: Negative for dizziness, weakness and headaches.  Hematological: Negative for adenopathy. Does not bruise/bleed easily.  Psychiatric/Behavioral: Negative for sleep disturbance and decreased concentration.   Past Medical History  Diagnosis Date  . Postmenopausal HRT (hormone replacement therapy)   . Allergy     allegic rhinitis  . Hypertension   . Depression   . History of colon polyps     History   Social History  . Marital Status: Married    Spouse Name: N/A    Number of Children: N/A  . Years of Education: N/A   Occupational History  . Not on file.   Social History Main Topics  . Smoking status: Never Smoker   . Smokeless tobacco: Not on file  . Alcohol Use: Yes  . Drug Use: No  . Sexually Active:    Other Topics Concern  . Not on file   Social History Narrative  . No narrative on file    No past surgical history on file.  No family history on file.  Allergies  Allergen Reactions  . Hydrochlorothiazide     REACTION:  rash    Current Outpatient Prescriptions on File Prior to Visit  Medication Sig Dispense Refill  . aspirin 81 MG tablet Take 81 mg by mouth daily.        . Calcium Carbonate-Vitamin D (CALCIUM-VITAMIN D) 600-200 MG-UNIT CAPS Take by mouth daily.        . Flaxseed, Linseed, (FLAX SEED OIL) 1000 MG CAPS Take 1,000 mg by mouth daily.      Marland Kitchen glucosamine-chondroitin 500-400 MG tablet Take 1 tablet by mouth 2 (two) times daily as needed.        Marland Kitchen losartan (COZAAR) 100 MG tablet TAKE 1 TABLET BY MOUTH ONCE DAILY  30 tablet  5  . buPROPion (WELLBUTRIN XL) 150 MG 24 hr tablet Take 1 tablet (150 mg total) by mouth daily.  30 tablet  5  . citalopram (CELEXA) 20 MG tablet Take 1 tablet (20 mg total) by mouth daily.  30 tablet  3    BP 132/86  Pulse 68  Temp 98.2 F (36.8 C)  Resp 16  Ht 5\' 6"  (1.676 m)  Wt 140 lb (63.504 kg)  BMI 22.60 kg/m2        Objective:   Physical Exam  Nursing note and vitals reviewed. Constitutional: She is oriented to person, place, and time. She appears well-developed and well-nourished. No distress.  HENT:  Head: Normocephalic and atraumatic.  Right Ear: External ear normal.  Left Ear: External ear normal.  Nose: Nose normal.  Mouth/Throat: Oropharynx is clear  and moist.  Eyes: Conjunctivae and EOM are normal. Pupils are equal, round, and reactive to light.  Neck: Normal range of motion. Neck supple. No JVD present. No tracheal deviation present. No thyromegaly present.  Cardiovascular: Normal rate, regular rhythm, normal heart sounds and intact distal pulses.   No murmur heard. Pulmonary/Chest: Effort normal and breath sounds normal. She has no wheezes. She exhibits no tenderness.  Abdominal: Soft. Bowel sounds are normal.  Musculoskeletal: Normal range of motion. She exhibits no edema and no tenderness.  Lymphadenopathy:    She has no cervical adenopathy.  Neurological: She is alert and oriented to person, place, and time. She has normal reflexes. No  cranial nerve deficit.  Skin: Skin is warm and dry. She is not diaphoretic.  Psychiatric: She has a normal mood and affect. Her behavior is normal.          Assessment & Plan:  We discontinued the pristiq after titration and began Wellbutrin 300 mg by mouth daily Wellbutrin should be an alternative therapy for her mood disorder plus energizing help with focus including ADD type symptoms.  Her blood pressure stable on her current medications Cozaar 100 mg by mouth daily

## 2012-04-12 NOTE — Patient Instructions (Signed)
The patient is instructed to continue all medications as prescribed. Schedule followup with check out clerk upon leaving the clinic  

## 2012-05-22 ENCOUNTER — Other Ambulatory Visit: Payer: Self-pay | Admitting: Internal Medicine

## 2012-06-16 ENCOUNTER — Ambulatory Visit (INDEPENDENT_AMBULATORY_CARE_PROVIDER_SITE_OTHER): Payer: BC Managed Care – PPO | Admitting: Internal Medicine

## 2012-06-16 ENCOUNTER — Encounter: Payer: Self-pay | Admitting: Internal Medicine

## 2012-06-16 VITALS — BP 118/80 | HR 72 | Temp 98.5°F | Resp 16 | Ht 66.0 in | Wt 138.0 lb

## 2012-06-16 DIAGNOSIS — F329 Major depressive disorder, single episode, unspecified: Secondary | ICD-10-CM

## 2012-06-16 DIAGNOSIS — T887XXA Unspecified adverse effect of drug or medicament, initial encounter: Secondary | ICD-10-CM

## 2012-06-16 DIAGNOSIS — R4589 Other symptoms and signs involving emotional state: Secondary | ICD-10-CM

## 2012-06-16 MED ORDER — BUPROPION HCL ER (XL) 150 MG PO TB24: 300.0000 mg | ORAL_TABLET | Freq: Every day | ORAL | Status: DC

## 2012-06-16 MED ORDER — BUPROPION HCL ER (XL) 300 MG PO TB24: 300.0000 mg | ORAL_TABLET | Freq: Every day | ORAL | Status: DC

## 2012-06-16 NOTE — Progress Notes (Signed)
  Subjective:    Patient ID: Sharon Medina, female    DOB: February 19, 1956, 56 y.o.   MRN: [id number]  HPI  To discussed antidepressant therapy the patient failed combination of Celexa and Wellbutrin  Review of Systems  Constitutional: Negative for activity change, appetite change and fatigue.  HENT: Negative for ear pain, congestion, neck pain, postnasal drip and sinus pressure.   Eyes: Negative for redness and visual disturbance.  Respiratory: Negative for cough, shortness of breath and wheezing.   Gastrointestinal: Negative for abdominal pain and abdominal distention.  Genitourinary: Negative for dysuria, frequency and menstrual problem.  Musculoskeletal: Negative for myalgias, joint swelling and arthralgias.  Skin: Negative for rash and wound.  Neurological: Negative for dizziness, weakness and headaches.  Hematological: Negative for adenopathy. Does not bruise/bleed easily.  Psychiatric/Behavioral: Negative for disturbed wake/sleep cycle and decreased concentration.       Objective:   Physical Exam  Constitutional: She is oriented to person, place, and time. She appears well-developed and well-nourished. No distress.  HENT:  Head: Normocephalic and atraumatic.  Right Ear: External ear normal.  Left Ear: External ear normal.  Nose: Nose normal.  Mouth/Throat: Oropharynx is clear and moist.  Eyes: Conjunctivae and EOM are normal. Pupils are equal, round, and reactive to light.  Neck: Normal range of motion. Neck supple. No JVD present. No tracheal deviation present. No thyromegaly present.  Cardiovascular: Normal rate, regular rhythm, normal heart sounds and intact distal pulses.   No murmur heard. Pulmonary/Chest: Effort normal and breath sounds normal. She has no wheezes. She exhibits no tenderness.  Abdominal: Soft. Bowel sounds are normal.  Musculoskeletal: Normal range of motion. She exhibits no edema and no tenderness.  Lymphadenopathy:    She has no cervical adenopathy.    Neurological: She is alert and oriented to person, place, and time. She has normal reflexes. No cranial nerve deficit.  Skin: Skin is warm and dry. She is not diaphoretic.  Psychiatric: She has a normal mood and affect. Her behavior is normal.          Assessment & Plan:  Failure of Celexa with Wellbutrin instead DC Celexa the patient has self discontinued and increase Wellbutrin to 300 mg.

## 2012-08-31 ENCOUNTER — Other Ambulatory Visit: Payer: Self-pay | Admitting: Internal Medicine

## 2012-08-31 DIAGNOSIS — Z1231 Encounter for screening mammogram for malignant neoplasm of breast: Secondary | ICD-10-CM

## 2012-09-11 ENCOUNTER — Other Ambulatory Visit (INDEPENDENT_AMBULATORY_CARE_PROVIDER_SITE_OTHER): Payer: BC Managed Care – PPO

## 2012-09-11 DIAGNOSIS — Z0000 Encounter for general adult medical examination without abnormal findings: Secondary | ICD-10-CM

## 2012-09-11 LAB — POCT URINALYSIS DIPSTICK
Ketones, UA: NEGATIVE
Protein, UA: NEGATIVE
Spec Grav, UA: 1.015
pH, UA: 7.5

## 2012-09-11 LAB — CBC WITH DIFFERENTIAL/PLATELET
Basophils Absolute: 0 10*3/uL (ref 0.0–0.1)
Eosinophils Absolute: 0.1 10*3/uL (ref 0.0–0.7)
HCT: 38.8 % (ref 36.0–46.0)
Lymphs Abs: 1.2 10*3/uL (ref 0.7–4.0)
MCV: 91.2 fl (ref 78.0–100.0)
Monocytes Absolute: 0.3 10*3/uL (ref 0.1–1.0)
Platelets: 238 10*3/uL (ref 150.0–400.0)
RDW: 13.4 % (ref 11.5–14.6)

## 2012-09-12 LAB — HEPATIC FUNCTION PANEL
ALT: 14 U/L (ref 0–35)
Alkaline Phosphatase: 43 U/L (ref 39–117)
Bilirubin, Direct: 0.1 mg/dL (ref 0.0–0.3)
Total Protein: 6.5 g/dL (ref 6.0–8.3)

## 2012-09-12 LAB — LIPID PANEL: Cholesterol: 192 mg/dL (ref 0–200)

## 2012-09-12 LAB — BASIC METABOLIC PANEL
BUN: 14 mg/dL (ref 6–23)
Chloride: 104 mEq/L (ref 96–112)
GFR: 64.61 mL/min (ref >60.00)
Glucose, Bld: 97 mg/dL (ref 70–99)
Potassium: 4.7 mEq/L (ref 3.5–5.1)

## 2012-09-18 ENCOUNTER — Encounter: Payer: Self-pay | Admitting: Internal Medicine

## 2012-09-18 ENCOUNTER — Ambulatory Visit (INDEPENDENT_AMBULATORY_CARE_PROVIDER_SITE_OTHER): Payer: BC Managed Care – PPO | Admitting: Internal Medicine

## 2012-09-18 VITALS — BP 134/84 | HR 76 | Temp 98.6°F | Resp 16 | Ht 66.0 in | Wt 139.0 lb

## 2012-09-18 DIAGNOSIS — Z0000 Encounter for general adult medical examination without abnormal findings: Secondary | ICD-10-CM

## 2012-09-18 DIAGNOSIS — F411 Generalized anxiety disorder: Secondary | ICD-10-CM

## 2012-09-18 DIAGNOSIS — F419 Anxiety disorder, unspecified: Secondary | ICD-10-CM

## 2012-09-18 DIAGNOSIS — N952 Postmenopausal atrophic vaginitis: Secondary | ICD-10-CM

## 2012-09-18 DIAGNOSIS — Z23 Encounter for immunization: Secondary | ICD-10-CM

## 2012-09-18 MED ORDER — ALPRAZOLAM 0.25 MG PO TABS: 0.2500 mg | ORAL_TABLET | Freq: Two times a day (BID) | ORAL | Status: DC | PRN

## 2012-09-18 MED ORDER — ESTRADIOL 0.1 MG/GM VA CREA: 1.0000 g | TOPICAL_CREAM | VAGINAL | Status: DC

## 2012-09-18 NOTE — Progress Notes (Signed)
Subjective:    Patient ID: Sharon Medina, female    DOB: 06-Jun-1956, 56 y.o.   MRN: [id number]  HPI Patient presents for her yearly physical examination.  We reviewed her medications review the lab work drawn prior to her office visit.  She is at goal for her cholesterol her kidney function is excellent her liver function is excellent and her thyroid is stable.  She still struggles with certain anxiety issues that are situational and we discussed those in detail. Patient suffers of vaginal dryness and irritation when she used the topical lubricants such as Replens she developed a yeast infection and irritation     Review of Systems  Constitutional: Negative for activity change, appetite change and fatigue.  HENT: Negative for ear pain, congestion, neck pain, postnasal drip and sinus pressure.   Eyes: Negative for redness and visual disturbance.  Respiratory: Negative for cough, shortness of breath and wheezing.   Gastrointestinal: Negative for abdominal pain and abdominal distention.  Genitourinary: Negative for dysuria, frequency and menstrual problem.  Musculoskeletal: Negative for myalgias, joint swelling and arthralgias.  Skin: Negative for rash and wound.  Neurological: Negative for dizziness, weakness and headaches.  Hematological: Negative for adenopathy. Does not bruise/bleed easily.  Psychiatric/Behavioral: Negative for disturbed wake/sleep cycle and decreased concentration.   Past Medical History  Diagnosis Date  . Postmenopausal HRT (hormone replacement therapy)   . Allergy     allegic rhinitis  . Hypertension   . Depression   . History of colon polyps     History   Social History  . Marital Status: Married    Spouse Name: N/A    Number of Children: N/A  . Years of Education: N/A   Occupational History  . Not on file.   Social History Main Topics  . Smoking status: Never Smoker   . Smokeless tobacco: Not on file  . Alcohol Use: Yes  . Drug Use: No  .  Sexually Active:    Other Topics Concern  . Not on file   Social History Narrative  . No narrative on file    No past surgical history on file.  No family history on file.  Allergies  Allergen Reactions  . Hydrochlorothiazide     REACTION: rash    Current Outpatient Prescriptions on File Prior to Visit  Medication Sig Dispense Refill  . aspirin 81 MG tablet Take 81 mg by mouth daily.        Marland Kitchen buPROPion (WELLBUTRIN XL) 300 MG 24 hr tablet Take 1 tablet (300 mg total) by mouth daily.  30 tablet  5  . Calcium Carbonate-Vitamin D (CALCIUM-VITAMIN D) 600-200 MG-UNIT CAPS Take by mouth daily.        . Flaxseed, Linseed, (FLAX SEED OIL) 1000 MG CAPS Take 1,000 mg by mouth daily.      Marland Kitchen glucosamine-chondroitin 500-400 MG tablet Take 1 tablet by mouth 2 (two) times daily as needed.        Marland Kitchen losartan (COZAAR) 100 MG tablet take 1 tablet by mouth once daily  30 tablet  5    BP 134/84  Pulse 76  Temp 98.6 F (37 C)  Resp 16  Ht 5\' 6"  (1.676 m)  Wt 139 lb (63.05 kg)  BMI 22.44 kg/m2       Objective:   Physical Exam  Nursing note and vitals reviewed. Constitutional: She is oriented to person, place, and time. She appears well-developed and well-nourished. No distress.  HENT:  Head: Normocephalic and atraumatic.  Right Ear: External ear normal.  Left Ear: External ear normal.  Nose: Nose normal.  Mouth/Throat: Oropharynx is clear and moist.  Eyes: Conjunctivae normal and EOM are normal. Pupils are equal, round, and reactive to light.  Neck: Normal range of motion. Neck supple. No JVD present. No tracheal deviation present. No thyromegaly present.  Cardiovascular: Normal rate, regular rhythm, normal heart sounds and intact distal pulses.   No murmur heard. Pulmonary/Chest: Effort normal and breath sounds normal. She has no wheezes. She exhibits no tenderness.  Abdominal: Soft. Bowel sounds are normal.  Musculoskeletal: Normal range of motion. She exhibits no edema and no  tenderness.  Lymphadenopathy:    She has no cervical adenopathy.  Neurological: She is alert and oriented to person, place, and time. She has normal reflexes. No cranial nerve deficit.  Skin: Skin is warm and dry. She is not diaphoretic.  Psychiatric: She has a normal mood and affect. Her behavior is normal.   Vaginal vault was markedly dry the cervix appeared normal bimanual examination was normal.  Breast examination revealed no focal masses or tenderness      Plan   This is a routine physical examination for this healthy  Female. Reviewed all health maintenance protocols including mammography colonoscopy bone density and reviewed appropriate screening labs. Her immunization history was reviewed as well as her current medications and allergies refills of her chronic medications were given and the plan for yearly health maintenance was discussed all orders and referrals were made as appropriate.  For vaginal atrophy and dryness we will try Estrace cream 1 g applied intravaginally twice weekly.  4 acute anxiety he will give her a short prescription for Xanax 0.25 when necessary #20

## 2012-09-18 NOTE — Patient Instructions (Signed)
The patient is instructed to continue all medications as prescribed. Schedule followup with check out clerk upon leaving the clinic  

## 2012-09-19 ENCOUNTER — Other Ambulatory Visit (HOSPITAL_COMMUNITY)
Admission: RE | Admit: 2012-09-19 | Discharge: 2012-09-19 | Disposition: A | Discharge status: Home / Self Care | Payer: BC Managed Care – PPO | Source: Ambulatory Visit | Attending: Internal Medicine | Admitting: Internal Medicine

## 2012-09-19 DIAGNOSIS — Z01419 Encounter for gynecological examination (general) (routine) without abnormal findings: Secondary | ICD-10-CM | POA: Insufficient documentation

## 2012-09-19 NOTE — Addendum Note (Signed)
Addended by: Willy Eddy on: 09/19/2012 12:50 PM   Modules accepted: Orders

## 2012-09-21 ENCOUNTER — Ambulatory Visit
Admission: RE | Admit: 2012-09-21 | Discharge: 2012-09-21 | Disposition: A | Discharge status: Home / Self Care | Payer: BC Managed Care – PPO | Source: Ambulatory Visit | Attending: Internal Medicine | Admitting: Internal Medicine

## 2012-09-21 DIAGNOSIS — Z1231 Encounter for screening mammogram for malignant neoplasm of breast: Secondary | ICD-10-CM

## 2012-09-27 ENCOUNTER — Telehealth: Payer: Self-pay | Admitting: Internal Medicine

## 2012-09-27 DIAGNOSIS — F419 Anxiety disorder, unspecified: Secondary | ICD-10-CM

## 2012-09-27 MED ORDER — ALPRAZOLAM 0.25 MG PO TABS: 0.2500 mg | ORAL_TABLET | Freq: Two times a day (BID) | ORAL | Status: DC | PRN

## 2012-09-27 NOTE — Telephone Encounter (Signed)
Patient called stating that she will be going into counseling on 10/09/12 and she has needed to take 1 xanax every 12 hours and she need a refill. Please advise/assist.

## 2012-10-09 ENCOUNTER — Telehealth: Payer: Self-pay | Admitting: Internal Medicine

## 2012-10-09 MED ORDER — ATENOLOL 25 MG PO TABS: 25.0000 mg | ORAL_TABLET | Freq: Every day | ORAL | Status: DC

## 2012-10-09 NOTE — Telephone Encounter (Signed)
Per dr Lovell Sheehan- add atenolol 20 daily-  Left message on machine For pt and sent to [facility] on [address]

## 2012-10-09 NOTE — Telephone Encounter (Signed)
Caller: Cniyah/Patient; Patient Name: Sharon Medina; PCP: Darryll Capers (Adults only); Best Callback Phone Number: [phone].  Pt was in the office to see Dr Lovell Sheehan on 09/18/12, for HTN and also "personal isuues".  Pt reports that she has begun counseling and while there last week her BP at 10am was 170/108, HR 113.  Pt states at 1pm the same day her BP was 143/87.  Pt denies any symptoms of high blood pressure (headaches, nosebleeds, etc).  Counselors wanted to make sure that pt notified PCP regarding blood pressure elevation.  Triaged patient per Hypertension, Diagnosed or Suspected.  Provide Home Care Disposition for 'Single elevated blood pressure reading and has questions'.  Care advice given per protocol.  RN offered to make pt an appt to come and discuss elevation in BP with Dr Lovell Sheehan.  Pt declined appt unless DR Lovell Sheehan thought it was necessary.

## 2012-10-13 ENCOUNTER — Telehealth: Payer: Self-pay | Admitting: Internal Medicine

## 2012-10-13 ENCOUNTER — Other Ambulatory Visit: Payer: Self-pay | Admitting: *Deleted

## 2012-10-13 DIAGNOSIS — I10 Essential (primary) hypertension: Secondary | ICD-10-CM

## 2012-10-13 MED ORDER — AMLODIPINE BESYLATE 5 MG PO TABS: 5.0000 mg | ORAL_TABLET | Freq: Every day | ORAL | Status: DC

## 2012-10-13 NOTE — Telephone Encounter (Signed)
Caller: [name]/Patient; Patient Name: Sharon Medina; PCP: Darryll Capers (Adults only); Best Callback Phone Number: [phone]; Reason for call: blood pressure.  States recently added 25mg  of atenolol.  States BP 184/94 10/13/12 1200, which is worse than when seen earlier in the week.  States understands she has some anxiety issues, so she will fluctuate, but states this number concerns her.  Usually has been running 130/70's.  Per hypertension protocol, advised appt within 4 hours; no appts available within dispositioned time frame.  Info to office for staff/provider review/management.   May reach patient at [phone].

## 2012-10-16 ENCOUNTER — Other Ambulatory Visit (INDEPENDENT_AMBULATORY_CARE_PROVIDER_SITE_OTHER): Payer: BC Managed Care – PPO

## 2012-10-16 DIAGNOSIS — I10 Essential (primary) hypertension: Secondary | ICD-10-CM

## 2012-10-16 LAB — BASIC METABOLIC PANEL
BUN: 12 mg/dL (ref 6–23)
CO2: 29 mEq/L (ref 19–32)
Calcium: 9.1 mg/dL (ref 8.4–10.5)
Creatinine, Ser: 0.8 mg/dL (ref 0.4–1.2)
GFR: 78.75 mL/min (ref >60.00)
Glucose, Bld: 91 mg/dL (ref 70–99)
Sodium: 137 mEq/L (ref 135–145)

## 2012-10-16 LAB — TSH: TSH: 1.35 u[IU]/mL (ref 0.35–5.50)

## 2012-10-21 LAB — 5 HIAA, QUANTITATIVE, URINE, 24 HOUR: 5-HIAA, 24 Hr Urine: 4.5 mg/24 h (ref <=6.0)

## 2012-10-23 LAB — CATECHOLAMINES, FRACTIONATED, URINE, 24 HOUR
Epinephrine, 24 hr Urine: 11
Norepinephrine, 24 hr Ur: 81

## 2012-10-24 ENCOUNTER — Ambulatory Visit (INDEPENDENT_AMBULATORY_CARE_PROVIDER_SITE_OTHER): Payer: BC Managed Care – PPO | Admitting: Internal Medicine

## 2012-10-24 VITALS — BP 160/80 | HR 72 | Temp 98.2°F | Resp 16 | Ht 66.0 in | Wt 139.0 lb

## 2012-10-24 DIAGNOSIS — I10 Essential (primary) hypertension: Secondary | ICD-10-CM

## 2012-10-24 MED ORDER — ATENOLOL 50 MG PO TABS: 50.0000 mg | ORAL_TABLET | Freq: Every day | ORAL | Status: DC

## 2012-10-24 NOTE — Progress Notes (Signed)
Subjective:    Patient ID: Sharon Medina, female    DOB: October 11, 1956, 56 y.o.   MRN: [id number]  HPI The patient tried Effexor  ( there was a transition off the Wellbutrin) but she stopped the Effexor abruptly Pt has increased stress and anxiety Has been very emotional and still is in counseling Want to use talk therapy rather than medications for now   Review of Systems  Constitutional: Negative for activity change, appetite change and fatigue.  HENT: Negative for ear pain, congestion, neck pain, postnasal drip and sinus pressure.   Eyes: Negative for redness and visual disturbance.  Respiratory: Negative for cough, shortness of breath and wheezing.   Gastrointestinal: Negative for abdominal pain and abdominal distention.  Genitourinary: Negative for dysuria, frequency and menstrual problem.  Musculoskeletal: Negative for myalgias, joint swelling and arthralgias.  Skin: Negative for rash and wound.  Neurological: Negative for dizziness, weakness and headaches.  Hematological: Negative for adenopathy. Does not bruise/bleed easily.  Psychiatric/Behavioral: Negative for sleep disturbance and decreased concentration.   Past Medical History  Diagnosis Date  . Postmenopausal HRT (hormone replacement therapy)   . Allergy     allegic rhinitis  . Hypertension   . Depression   . History of colon polyps     History   Social History  . Marital Status: Married    Spouse Name: N/A    Number of Children: N/A  . Years of Education: N/A   Occupational History  . Not on file.   Social History Main Topics  . Smoking status: Never Smoker   . Smokeless tobacco: Not on file  . Alcohol Use: Yes  . Drug Use: No  . Sexually Active:    Other Topics Concern  . Not on file   Social History Narrative  . No narrative on file    No past surgical history on file.  No family history on file.  Allergies  Allergen Reactions  . Hydrochlorothiazide     REACTION: rash    Current  Outpatient Prescriptions on File Prior to Visit  Medication Sig Dispense Refill  . amLODipine (NORVASC) 5 MG tablet Take 1 tablet (5 mg total) by mouth daily.  90 tablet  3  . aspirin 81 MG tablet Take 81 mg by mouth daily.        Marland Kitchen atenolol (TENORMIN) 50 MG tablet Take 1 tablet (50 mg total) by mouth daily.  90 tablet  3  . Calcium Carbonate-Vitamin D (CALCIUM-VITAMIN D) 600-200 MG-UNIT CAPS Take by mouth daily.        . Flaxseed, Linseed, (FLAX SEED OIL) 1000 MG CAPS Take 1,000 mg by mouth daily.      Marland Kitchen glucosamine-chondroitin 500-400 MG tablet Take 1 tablet by mouth 2 (two) times daily as needed.        Marland Kitchen losartan (COZAAR) 100 MG tablet take 1 tablet by mouth once daily  30 tablet  11    BP 160/80  Pulse 72  Temp 98.2 F (36.8 C)  Resp 16  Ht 5\' 6"  (1.676 m)  Wt 139 lb (63.05 kg)  BMI 22.44 kg/m2       Objective:   Physical Exam  Vitals reviewed. Constitutional: She is oriented to person, place, and time. She appears well-developed and well-nourished. No distress.  HENT:  Head: Normocephalic and atraumatic.  Right Ear: External ear normal.  Left Ear: External ear normal.  Nose: Nose normal.  Mouth/Throat: Oropharynx is clear and moist.  Eyes: Conjunctivae normal and EOM are  normal. Pupils are equal, round, and reactive to light.  Neck: Normal range of motion. Neck supple. No JVD present. No tracheal deviation present. No thyromegaly present.  Cardiovascular: Normal rate, regular rhythm, normal heart sounds and intact distal pulses.   No murmur heard. Pulmonary/Chest: Effort normal and breath sounds normal. She has no wheezes. She exhibits no tenderness.  Abdominal: Soft. Bowel sounds are normal.  Musculoskeletal: Normal range of motion. She exhibits no edema and no tenderness.  Lymphadenopathy:    She has no cervical adenopathy.  Neurological: She is alert and oriented to person, place, and time. She has normal reflexes. No cranial nerve deficit.  Skin: Skin is warm and  dry. She is not diaphoretic.  Psychiatric: She has a normal mood and affect. Her behavior is normal.          Assessment & Plan:  Refer to psychology New diagnosis od worsening essential HTN in setting of increased stress Increase BB for elevation of BP and monitor effect Set goals for treatment

## 2012-10-24 NOTE — Patient Instructions (Signed)
The increased blood pressure and pulse we're increasing the atenolol to 50 mg she may take 2 of the 25 mg but I have sent in a new prescription

## 2012-11-22 ENCOUNTER — Encounter: Payer: Self-pay | Admitting: Internal Medicine

## 2012-11-22 ENCOUNTER — Other Ambulatory Visit: Payer: Self-pay | Admitting: Internal Medicine

## 2012-11-22 ENCOUNTER — Ambulatory Visit (INDEPENDENT_AMBULATORY_CARE_PROVIDER_SITE_OTHER): Payer: BC Managed Care – PPO | Admitting: Internal Medicine

## 2012-11-22 VITALS — BP 110/70 | HR 68 | Temp 98.0°F | Resp 16 | Ht 66.0 in | Wt 134.0 lb

## 2012-11-22 DIAGNOSIS — I10 Essential (primary) hypertension: Secondary | ICD-10-CM

## 2012-11-22 DIAGNOSIS — N952 Postmenopausal atrophic vaginitis: Secondary | ICD-10-CM

## 2012-11-22 MED ORDER — ESTRADIOL 0.1 MG/GM VA CREA: 1.0000 g | TOPICAL_CREAM | VAGINAL | Status: DC

## 2012-11-22 NOTE — Progress Notes (Signed)
  Subjective:    Patient ID: Sharon Medina, female    DOB: 1956-02-07, 56 y.o.   MRN: [id number]  HPI Atrophic vaginitis with improvement with use of topical estrogen HTN stable    Review of Systems  Constitutional: Negative for activity change, appetite change and fatigue.  HENT: Negative for ear pain, congestion, neck pain, postnasal drip and sinus pressure.   Eyes: Negative for redness and visual disturbance.  Respiratory: Negative for cough, shortness of breath and wheezing.   Gastrointestinal: Negative for abdominal pain and abdominal distention.  Genitourinary: Negative for dysuria, frequency and menstrual problem.  Musculoskeletal: Negative for myalgias, joint swelling and arthralgias.  Skin: Negative for rash and wound.  Neurological: Negative for dizziness, weakness and headaches.  Hematological: Negative for adenopathy. Does not bruise/bleed easily.  Psychiatric/Behavioral: Negative for sleep disturbance and decreased concentration.   Past Medical History  Diagnosis Date  . Postmenopausal HRT (hormone replacement therapy)   . Allergy     allegic rhinitis  . Hypertension   . Depression   . History of colon polyps     History   Social History  . Marital Status: Married    Spouse Name: N/A    Number of Children: N/A  . Years of Education: N/A   Occupational History  . Not on file.   Social History Main Topics  . Smoking status: Never Smoker   . Smokeless tobacco: Not on file  . Alcohol Use: Yes  . Drug Use: No  . Sexually Active:    Other Topics Concern  . Not on file   Social History Narrative  . No narrative on file    No past surgical history on file.  No family history on file.  Allergies  Allergen Reactions  . Hydrochlorothiazide     REACTION: rash    Current Outpatient Prescriptions on File Prior to Visit  Medication Sig Dispense Refill  . amLODipine (NORVASC) 5 MG tablet Take 1 tablet (5 mg total) by mouth daily.  90 tablet  3  .  aspirin 81 MG tablet Take 81 mg by mouth daily.        Marland Kitchen atenolol (TENORMIN) 50 MG tablet Take 1 tablet (50 mg total) by mouth daily.  90 tablet  3  . Calcium Carbonate-Vitamin D (CALCIUM-VITAMIN D) 600-200 MG-UNIT CAPS Take by mouth daily.        . Flaxseed, Linseed, (FLAX SEED OIL) 1000 MG CAPS Take 1,000 mg by mouth daily.      Marland Kitchen glucosamine-chondroitin 500-400 MG tablet Take 1 tablet by mouth 2 (two) times daily as needed.        Marland Kitchen losartan (COZAAR) 100 MG tablet take 1 tablet by mouth once daily  30 tablet  11    BP 110/70  Pulse 68  Temp 98 F (36.7 C)  Resp 16  Ht 5\' 6"  (1.676 m)  Wt 134 lb (60.782 kg)  BMI 21.63 kg/m2        Objective:   Physical Exam  Nursing note and vitals reviewed. Constitutional: She appears well-developed and well-nourished.  Eyes: Conjunctivae normal are normal. Pupils are equal, round, and reactive to light.  Cardiovascular: Normal rate and regular rhythm.   Pulmonary/Chest: Effort normal and breath sounds normal.  Abdominal: Soft. Bowel sounds are normal.  Skin: Skin is warm and dry.          Assessment & Plan:  Continue topical/ intravaginal estrogen HTN stable

## 2012-11-29 ENCOUNTER — Other Ambulatory Visit: Payer: Self-pay | Admitting: Internal Medicine

## 2012-11-29 DIAGNOSIS — N952 Postmenopausal atrophic vaginitis: Secondary | ICD-10-CM

## 2012-11-29 MED ORDER — ESTRADIOL 0.1 MG/GM VA CREA: 1.0000 g | TOPICAL_CREAM | VAGINAL | Status: DC

## 2012-11-29 NOTE — Telephone Encounter (Signed)
Pt has not been able to pick up script from last visit-not at pharmacy. estradiol (ESTRACE VAGINAL) 0.1 MG/GM vaginal cream   Can you resend to [facility]?

## 2012-11-29 NOTE — Telephone Encounter (Signed)
Sent in

## 2012-12-31 NOTE — Patient Instructions (Signed)
The patient is instructed to continue all medications as prescribed. Schedule followup with check out clerk upon leaving the clinic  

## 2013-01-01 ENCOUNTER — Encounter: Payer: Self-pay | Admitting: Internal Medicine

## 2013-03-23 ENCOUNTER — Encounter: Payer: Self-pay | Admitting: Internal Medicine

## 2013-03-23 ENCOUNTER — Ambulatory Visit (INDEPENDENT_AMBULATORY_CARE_PROVIDER_SITE_OTHER): Payer: BC Managed Care – PPO | Admitting: Internal Medicine

## 2013-03-23 VITALS — BP 130/80 | HR 68 | Temp 98.1°F | Resp 16 | Ht 66.0 in | Wt 140.0 lb

## 2013-03-23 DIAGNOSIS — N952 Postmenopausal atrophic vaginitis: Secondary | ICD-10-CM

## 2013-03-23 DIAGNOSIS — I10 Essential (primary) hypertension: Secondary | ICD-10-CM

## 2013-03-23 MED ORDER — ESTRADIOL 0.1 MG/GM VA CREA: 1.0000 g | TOPICAL_CREAM | VAGINAL | Status: DC

## 2013-03-23 NOTE — Progress Notes (Signed)
Subjective:    Patient ID: Sharon Medina, female    DOB: 1956-02-12, 57 y.o.   MRN: [id number]  HPI  Working with therapist   Review of Systems  Constitutional: Negative for activity change, appetite change and fatigue.  HENT: Negative for ear pain, congestion, neck pain, postnasal drip and sinus pressure.   Eyes: Negative for redness and visual disturbance.  Respiratory: Negative for cough, shortness of breath and wheezing.   Gastrointestinal: Negative for abdominal pain and abdominal distention.  Genitourinary: Negative for dysuria, frequency and menstrual problem.  Musculoskeletal: Negative for myalgias, joint swelling and arthralgias.  Skin: Negative for rash and wound.  Neurological: Negative for dizziness, weakness and headaches.  Hematological: Negative for adenopathy. Does not bruise/bleed easily.  Psychiatric/Behavioral: Negative for sleep disturbance and decreased concentration.   Past Medical History  Diagnosis Date  . Postmenopausal HRT (hormone replacement therapy)   . Allergy     allegic rhinitis  . Hypertension   . Depression   . History of colon polyps     History   Social History  . Marital Status: Married    Spouse Name: N/A    Number of Children: N/A  . Years of Education: N/A   Occupational History  . Not on file.   Social History Main Topics  . Smoking status: Never Smoker   . Smokeless tobacco: Not on file  . Alcohol Use: Yes  . Drug Use: No  . Sexually Active:    Other Topics Concern  . Not on file   Social History Narrative  . No narrative on file    History reviewed. No pertinent past surgical history.  No family history on file.  Allergies  Allergen Reactions  . Hydrochlorothiazide     REACTION: rash    Current Outpatient Prescriptions on File Prior to Visit  Medication Sig Dispense Refill  . amLODipine (NORVASC) 5 MG tablet Take 1 tablet (5 mg total) by mouth daily.  90 tablet  3  . aspirin 81 MG tablet Take 81 mg by  mouth daily.        Marland Kitchen atenolol (TENORMIN) 50 MG tablet Take 1 tablet (50 mg total) by mouth daily.  90 tablet  3  . Calcium Carbonate-Vitamin D (CALCIUM-VITAMIN D) 600-200 MG-UNIT CAPS Take by mouth daily.        Marland Kitchen estradiol (ESTRACE VAGINAL) 0.1 MG/GM vaginal cream Place 0.25 Applicatorfuls vaginally 2 (two) times a week.  42.5 g  12  . Flaxseed, Linseed, (FLAX SEED OIL) 1000 MG CAPS Take 1,000 mg by mouth daily.      Marland Kitchen glucosamine-chondroitin 500-400 MG tablet Take 1 tablet by mouth 2 (two) times daily as needed.        Marland Kitchen losartan (COZAAR) 100 MG tablet take 1 tablet by mouth once daily  30 tablet  11   No current facility-administered medications on file prior to visit.    BP 130/80  Pulse 68  Temp(Src) 98.1 F (36.7 C)  Resp 16  Ht 5\' 6"  (1.676 m)  Wt 140 lb (63.504 kg)  BMI 22.61 kg/m2        Objective:   Physical Exam  Nursing note and vitals reviewed. Constitutional: She is oriented to person, place, and time. She appears well-developed and well-nourished. No distress.  HENT:  Head: Normocephalic and atraumatic.  Right Ear: External ear normal.  Left Ear: External ear normal.  Nose: Nose normal.  Mouth/Throat: Oropharynx is clear and moist.  Eyes: Conjunctivae and EOM are normal. Pupils  are equal, round, and reactive to light.  Neck: Normal range of motion. Neck supple. No JVD present. No tracheal deviation present. No thyromegaly present.  Cardiovascular: Normal rate, regular rhythm, normal heart sounds and intact distal pulses.   No murmur heard. Pulmonary/Chest: Effort normal and breath sounds normal. She has no wheezes. She exhibits no tenderness.  Abdominal: Soft. Bowel sounds are normal.  Musculoskeletal: Normal range of motion. She exhibits no edema and no tenderness.  Lymphadenopathy:    She has no cervical adenopathy.  Neurological: She is alert and oriented to person, place, and time. She has normal reflexes. No cranial nerve deficit.  Skin: Skin is warm  and dry. She is not diaphoretic.  Psychiatric: She has a normal mood and affect. Her behavior is normal.          Assessment & Plan:  Blood pressure stable wellbutrin is working well Lipid management Death in family.

## 2013-09-12 ENCOUNTER — Other Ambulatory Visit: Payer: Self-pay

## 2013-09-12 DIAGNOSIS — Z1231 Encounter for screening mammogram for malignant neoplasm of breast: Secondary | ICD-10-CM

## 2013-09-17 ENCOUNTER — Other Ambulatory Visit (INDEPENDENT_AMBULATORY_CARE_PROVIDER_SITE_OTHER): Payer: BC Managed Care – PPO

## 2013-09-17 DIAGNOSIS — Z0000 Encounter for general adult medical examination without abnormal findings: Secondary | ICD-10-CM

## 2013-09-17 LAB — CBC WITH DIFFERENTIAL/PLATELET
Basophils Relative: 0.9 % (ref 0.0–3.0)
Eosinophils Relative: 5.7 % — ABNORMAL HIGH (ref 0.0–5.0)
Lymphocytes Relative: 35.9 % (ref 12.0–46.0)
MCV: 89.8 fl (ref 78.0–100.0)
Monocytes Absolute: 0.3 10*3/uL (ref 0.1–1.0)
Neutrophils Relative %: 50.2 % (ref 43.0–77.0)
Platelets: 236 10*3/uL (ref 150.0–400.0)
RBC: 4.34 Mil/uL (ref 3.87–5.11)
WBC: 4.5 10*3/uL (ref 4.5–10.5)

## 2013-09-17 LAB — HEPATIC FUNCTION PANEL
ALT: 12 U/L (ref 0–35)
Alkaline Phosphatase: 38 U/L — ABNORMAL LOW (ref 39–117)
Bilirubin, Direct: 0.1 mg/dL (ref 0.0–0.3)
Total Bilirubin: 0.7 mg/dL (ref 0.3–1.2)
Total Protein: 6.5 g/dL (ref 6.0–8.3)

## 2013-09-17 LAB — POCT URINALYSIS DIPSTICK
Bilirubin, UA: NEGATIVE
Ketones, UA: NEGATIVE
Leukocytes, UA: NEGATIVE
Spec Grav, UA: 1.02
pH, UA: 7

## 2013-09-17 LAB — LIPID PANEL
Cholesterol: 175 mg/dL (ref 0–200)
HDL: 74.8 mg/dL (ref >39.00)
LDL Cholesterol: 89 mg/dL (ref 0–99)
VLDL: 11.6 mg/dL (ref 0.0–40.0)

## 2013-09-17 LAB — BASIC METABOLIC PANEL
BUN: 18 mg/dL (ref 6–23)
Calcium: 8.9 mg/dL (ref 8.4–10.5)
Creatinine, Ser: 0.8 mg/dL (ref 0.4–1.2)
GFR: 83.28 mL/min (ref >60.00)

## 2013-09-24 ENCOUNTER — Ambulatory Visit (INDEPENDENT_AMBULATORY_CARE_PROVIDER_SITE_OTHER): Payer: BC Managed Care – PPO | Admitting: Internal Medicine

## 2013-09-24 ENCOUNTER — Other Ambulatory Visit (HOSPITAL_COMMUNITY)
Admission: RE | Admit: 2013-09-24 | Discharge: 2013-09-24 | Disposition: A | Discharge status: Home / Self Care | Payer: BC Managed Care – PPO | Source: Ambulatory Visit | Attending: Internal Medicine | Admitting: Internal Medicine

## 2013-09-24 ENCOUNTER — Encounter: Payer: Self-pay | Admitting: Internal Medicine

## 2013-09-24 VITALS — BP 130/70 | HR 76 | Temp 98.6°F | Resp 16 | Ht 66.0 in | Wt 142.0 lb

## 2013-09-24 DIAGNOSIS — N390 Urinary tract infection, site not specified: Secondary | ICD-10-CM

## 2013-09-24 DIAGNOSIS — Z01419 Encounter for gynecological examination (general) (routine) without abnormal findings: Secondary | ICD-10-CM | POA: Insufficient documentation

## 2013-09-24 DIAGNOSIS — Z0000 Encounter for general adult medical examination without abnormal findings: Secondary | ICD-10-CM

## 2013-09-24 DIAGNOSIS — I10 Essential (primary) hypertension: Secondary | ICD-10-CM

## 2013-09-24 DIAGNOSIS — Z23 Encounter for immunization: Secondary | ICD-10-CM

## 2013-09-24 MED ORDER — CIPROFLOXACIN HCL 250 MG PO TABS: 250.0000 mg | ORAL_TABLET | Freq: Two times a day (BID) | ORAL | Status: DC

## 2013-09-24 NOTE — Addendum Note (Signed)
Addended by: Willy Eddy on: 09/24/2013 02:41 PM   Modules accepted: Orders

## 2013-09-24 NOTE — Progress Notes (Signed)
Subjective:    Patient ID: Sharon Medina, female    DOB: April 16, 1956, 57 y.o.   MRN: [id number]  HPI   CPX Mood stable on medications Stable HTN   Review of Systems  Constitutional: Negative for activity change, appetite change and fatigue.  HENT: Negative for ear pain, congestion, neck pain, postnasal drip and sinus pressure.   Eyes: Negative for redness and visual disturbance.  Respiratory: Negative for cough, shortness of breath and wheezing.   Gastrointestinal: Negative for abdominal pain and abdominal distention.  Genitourinary: Negative for dysuria, frequency and menstrual problem.  Musculoskeletal: Negative for myalgias, joint swelling and arthralgias.  Skin: Negative for rash and wound.  Neurological: Negative for dizziness, weakness and headaches.  Hematological: Negative for adenopathy. Does not bruise/bleed easily.  Psychiatric/Behavioral: Negative for sleep disturbance and decreased concentration.       Past Medical History  Diagnosis Date  . Postmenopausal HRT (hormone replacement therapy)   . Allergy     allegic rhinitis  . Hypertension   . Depression   . History of colon polyps     History   Social History  . Marital Status: Married    Spouse Name: N/A    Number of Children: N/A  . Years of Education: N/A   Occupational History  . Not on file.   Social History Main Topics  . Smoking status: Never Smoker   . Smokeless tobacco: Not on file  . Alcohol Use: Yes  . Drug Use: No  . Sexual Activity:    Other Topics Concern  . Not on file   Social History Narrative  . No narrative on file    History reviewed. No pertinent past surgical history.  No family history on file.  Allergies  Allergen Reactions  . Hydrochlorothiazide     REACTION: rash    Current Outpatient Prescriptions on File Prior to Visit  Medication Sig Dispense Refill  . amLODipine (NORVASC) 5 MG tablet Take 1 tablet (5 mg total) by mouth daily.  90 tablet  3  . aspirin  81 MG tablet Take 81 mg by mouth daily.        Marland Kitchen atenolol (TENORMIN) 50 MG tablet Take 1 tablet (50 mg total) by mouth daily.  90 tablet  3  . buPROPion (WELLBUTRIN XL) 300 MG 24 hr tablet Take 300 mg by mouth daily.      . Calcium Carbonate-Vitamin D (CALCIUM-VITAMIN D) 600-200 MG-UNIT CAPS Take by mouth daily.        Marland Kitchen estradiol (ESTRACE VAGINAL) 0.1 MG/GM vaginal cream Place 0.25 Applicatorfuls vaginally every other day.  42.5 g  12  . Flaxseed, Linseed, (FLAX SEED OIL) 1000 MG CAPS Take 1,000 mg by mouth daily.      Marland Kitchen glucosamine-chondroitin 500-400 MG tablet Take 1 tablet by mouth 2 (two) times daily as needed.        Marland Kitchen losartan (COZAAR) 100 MG tablet take 1 tablet by mouth once daily  30 tablet  11   No current facility-administered medications on file prior to visit.    BP 130/70  Pulse 76  Temp(Src) 98.6 F (37 C)  Resp 16  Ht 5\' 6"  (1.676 m)  Wt 142 lb (64.411 kg)  BMI 22.93 kg/m2    Objective:   Physical Exam  Nursing note and vitals reviewed. Constitutional: She is oriented to person, place, and time. She appears well-developed and well-nourished. No distress.  HENT:  Head: Normocephalic and atraumatic.  Right Ear: External ear normal.  Left  Ear: External ear normal.  Nose: Nose normal.  Mouth/Throat: Oropharynx is clear and moist.  Eyes: Conjunctivae and EOM are normal. Pupils are equal, round, and reactive to light.  Neck: Normal range of motion. Neck supple. No JVD present. No tracheal deviation present. No thyromegaly present.  Cardiovascular: Normal rate, regular rhythm, normal heart sounds and intact distal pulses.   No murmur heard. Pulmonary/Chest: Effort normal and breath sounds normal. She has no wheezes. She exhibits no tenderness.  Abdominal: Soft. Bowel sounds are normal.  Musculoskeletal: Normal range of motion. She exhibits no edema and no tenderness.  Lymphadenopathy:    She has no cervical adenopathy.  Neurological: She is alert and oriented to  person, place, and time. She has normal reflexes. No cranial nerve deficit.  Skin: Skin is warm and dry. She is not diaphoretic.  Psychiatric: She has a normal mood and affect. Her behavior is normal.          Assessment & Plan:   This is a routine physical examination for this healthy  Female. Reviewed all health maintenance protocols including mammography colonoscopy bone density and reviewed appropriate screening labs. Her immunization history was reviewed as well as her current medications and allergies refills of her chronic medications were given and the plan for yearly health maintenance was discussed all orders and referrals were made as appropriate.  Ankle swelling Urinary tract infection No symptoms

## 2013-09-28 ENCOUNTER — Ambulatory Visit
Admission: RE | Admit: 2013-09-28 | Discharge: 2013-09-28 | Disposition: A | Discharge status: Home / Self Care | Payer: BC Managed Care – PPO | Source: Ambulatory Visit

## 2013-09-28 DIAGNOSIS — Z1231 Encounter for screening mammogram for malignant neoplasm of breast: Secondary | ICD-10-CM

## 2013-10-08 ENCOUNTER — Other Ambulatory Visit: Payer: Self-pay | Admitting: Internal Medicine

## 2013-11-04 ENCOUNTER — Other Ambulatory Visit: Payer: Self-pay | Admitting: Internal Medicine

## 2013-11-10 ENCOUNTER — Other Ambulatory Visit: Payer: Self-pay | Admitting: Internal Medicine

## 2013-11-21 ENCOUNTER — Other Ambulatory Visit: Payer: Self-pay | Admitting: Internal Medicine

## 2014-03-25 ENCOUNTER — Ambulatory Visit: Payer: BC Managed Care – PPO | Admitting: Internal Medicine

## 2014-06-03 ENCOUNTER — Ambulatory Visit: Payer: BC Managed Care – PPO | Admitting: Internal Medicine

## 2014-06-03 ENCOUNTER — Ambulatory Visit: Payer: BC Managed Care – PPO | Admitting: Physician Assistant

## 2014-08-05 ENCOUNTER — Other Ambulatory Visit: Payer: Self-pay | Admitting: Internal Medicine

## 2014-08-20 ENCOUNTER — Other Ambulatory Visit: Payer: Self-pay

## 2014-08-20 DIAGNOSIS — Z1231 Encounter for screening mammogram for malignant neoplasm of breast: Secondary | ICD-10-CM

## 2014-09-20 ENCOUNTER — Other Ambulatory Visit: Payer: BC Managed Care – PPO

## 2014-09-24 ENCOUNTER — Ambulatory Visit: Payer: BC Managed Care – PPO | Admitting: Family Medicine

## 2014-09-25 ENCOUNTER — Ambulatory Visit (INDEPENDENT_AMBULATORY_CARE_PROVIDER_SITE_OTHER): Payer: BC Managed Care – PPO

## 2014-09-25 DIAGNOSIS — Z23 Encounter for immunization: Secondary | ICD-10-CM

## 2014-10-02 ENCOUNTER — Ambulatory Visit
Admission: RE | Admit: 2014-10-02 | Discharge: 2014-10-02 | Disposition: A | Discharge status: Home / Self Care | Payer: BC Managed Care – PPO | Source: Ambulatory Visit

## 2014-10-02 DIAGNOSIS — Z1231 Encounter for screening mammogram for malignant neoplasm of breast: Secondary | ICD-10-CM

## 2014-11-07 ENCOUNTER — Other Ambulatory Visit (INDEPENDENT_AMBULATORY_CARE_PROVIDER_SITE_OTHER): Payer: BC Managed Care – PPO

## 2014-11-07 DIAGNOSIS — Z0000 Encounter for general adult medical examination without abnormal findings: Secondary | ICD-10-CM

## 2014-11-07 LAB — HEPATIC FUNCTION PANEL
ALBUMIN: 3.9 g/dL (ref 3.5–5.2)
ALT: 15 U/L (ref 0–35)
AST: 20 U/L (ref 0–37)
Alkaline Phosphatase: 44 U/L (ref 39–117)
BILIRUBIN TOTAL: 0.6 mg/dL (ref 0.2–1.2)
Bilirubin, Direct: 0 mg/dL (ref 0.0–0.3)
Total Protein: 6.1 g/dL (ref 6.0–8.3)

## 2014-11-07 LAB — POCT URINALYSIS DIPSTICK
Bilirubin, UA: NEGATIVE
GLUCOSE UA: NEGATIVE
Ketones, UA: NEGATIVE
Leukocytes, UA: NEGATIVE
NITRITE UA: NEGATIVE
PROTEIN UA: NEGATIVE
RBC UA: NEGATIVE
Spec Grav, UA: 1.015
UROBILINOGEN UA: 0.2
pH, UA: 7

## 2014-11-07 LAB — CBC WITH DIFFERENTIAL/PLATELET
BASOS PCT: 1 % (ref 0.0–3.0)
Basophils Absolute: 0 10*3/uL (ref 0.0–0.1)
EOS ABS: 0.3 10*3/uL (ref 0.0–0.7)
EOS PCT: 7 % — AB (ref 0.0–5.0)
HEMATOCRIT: 39.5 % (ref 36.0–46.0)
HEMOGLOBIN: 13 g/dL (ref 12.0–15.0)
LYMPHS ABS: 1.8 10*3/uL (ref 0.7–4.0)
Lymphocytes Relative: 42.4 % (ref 12.0–46.0)
MCHC: 32.8 g/dL (ref 30.0–36.0)
MCV: 90.7 fl (ref 78.0–100.0)
MONO ABS: 0.3 10*3/uL (ref 0.1–1.0)
Monocytes Relative: 7.4 % (ref 3.0–12.0)
NEUTROS ABS: 1.8 10*3/uL (ref 1.4–7.7)
Neutrophils Relative %: 42.2 % — ABNORMAL LOW (ref 43.0–77.0)
Platelets: 219 10*3/uL (ref 150.0–400.0)
RBC: 4.36 Mil/uL (ref 3.87–5.11)
RDW: 13.6 % (ref 11.5–15.5)
WBC: 4.4 10*3/uL (ref 4.0–10.5)

## 2014-11-07 LAB — LIPID PANEL
CHOL/HDL RATIO: 2
Cholesterol: 170 mg/dL (ref 0–200)
HDL: 74.1 mg/dL (ref >39.00)
LDL CALC: 88 mg/dL (ref 0–99)
NONHDL: 95.9
Triglycerides: 41 mg/dL (ref 0.0–149.0)
VLDL: 8.2 mg/dL (ref 0.0–40.0)

## 2014-11-07 LAB — BASIC METABOLIC PANEL
BUN: 23 mg/dL (ref 6–23)
CHLORIDE: 105 meq/L (ref 96–112)
CO2: 28 meq/L (ref 19–32)
CREATININE: 0.8 mg/dL (ref 0.4–1.2)
Calcium: 8.8 mg/dL (ref 8.4–10.5)
GFR: 75.98 mL/min (ref >60.00)
Glucose, Bld: 85 mg/dL (ref 70–99)
POTASSIUM: 4.5 meq/L (ref 3.5–5.1)
SODIUM: 139 meq/L (ref 135–145)

## 2014-11-07 LAB — TSH: TSH: 2.29 u[IU]/mL (ref 0.35–4.50)

## 2014-11-21 ENCOUNTER — Ambulatory Visit (INDEPENDENT_AMBULATORY_CARE_PROVIDER_SITE_OTHER): Payer: BC Managed Care – PPO | Admitting: Family Medicine

## 2014-11-21 ENCOUNTER — Encounter: Payer: Self-pay | Admitting: Family Medicine

## 2014-11-21 VITALS — BP 120/82 | Temp 98.1°F | Wt 146.0 lb

## 2014-11-21 DIAGNOSIS — N950 Postmenopausal bleeding: Secondary | ICD-10-CM

## 2014-11-21 DIAGNOSIS — F329 Major depressive disorder, single episode, unspecified: Secondary | ICD-10-CM

## 2014-11-21 DIAGNOSIS — J302 Other seasonal allergic rhinitis: Secondary | ICD-10-CM

## 2014-11-21 DIAGNOSIS — I10 Essential (primary) hypertension: Secondary | ICD-10-CM

## 2014-11-21 DIAGNOSIS — F32A Depression, unspecified: Secondary | ICD-10-CM

## 2014-11-21 NOTE — Patient Instructions (Signed)
Take allegra for 1-2 months to see if this improves your symptoms. In the evening.   Will refer to gynecology for what seems to be but is not clearly postmenopausal bleeding.   For depression-please keep a close eye on this and let me know if anything changes

## 2014-11-21 NOTE — Assessment & Plan Note (Signed)
I suspect patient's lingering symptoms from her cold over the summer are likely allergy related. I have advised her to try an over-the-counter antihistamine such as Claritin or Zyrtec for at least 1-2 months. She will follow-up if her symptoms persist.

## 2014-11-21 NOTE — Assessment & Plan Note (Signed)
PHQ9 2 of 0. Patient seems to be controlling her depression with exercise. She stopped Wellbutrin in the summer due to poor sleep on the medication. I counseled patient and gave her strict return precautions as she has had recurrent bouts of depression in the past.

## 2014-11-21 NOTE — Progress Notes (Signed)
Sharon Reddish, MD Phone: [phone]  Subjective:  Patient presents today to establish care with me as their new primary care provider. Patient was formerly a patient of Dr. Arnoldo Morale. Chief complaint-noted.   Hypertension-good control  BP Readings from Last 3 Encounters:  11/21/14 120/82  09/24/13 130/70  03/23/13 130/80   Home BP monitoring-no Compliant with medications-yes without side effects ROS-Denies any CP, HA, SOB, blurry vision, LE edema.   Depression Wellbutrin 300mg  XL stopped in the summer- poor sleep on the medication. Has intermittent bouts of depression. Has been in therapy in the past. Would get up at 2 am and couldn't get back to sleep even at 150mg . Has been on zoloft-felt better and not sure if she was sleeping. PHQ 2 of 0.   Exercises 3 days a week with a [employer] [place] [employer]. > 30 minutes per session.  ROS-no SI HI  Allergic rhinitis had a bad cough in the summer and treated with steroids and antibiotics. Throat feels intermittently scratchy and has a cough. Dry cough. Never really gone away completely.   ROS-No watery/itchy eyes. Some sneezing.  Postmenopausal bleeding In the last few years (5-10 years) has had periods at least every 9-10 months and recently had 2 in last year. 10 years ago had periods every 6-8 weeks. Last 1 week when they come.   Mom and sister both had hysterectomy. Mom with benign tumor. Sister with endometriosis as reason for her removal of uterus. Uses estrace cream for vaginal dryness.  Ros- no unintentional weight loss, fevers, night sweats, worsening fatigue  The following were reviewed and entered/updated in epic: Past Medical History  Diagnosis Date  . Postmenopausal HRT (hormone replacement therapy)   . Allergy     allegic rhinitis  . Hypertension   . Depression   . History of colon polyps    Patient Active Problem List   Diagnosis Date Noted  . Postmenopausal bleeding 11/21/2014    Priority: Medium  .  Depression 08/24/2007    Priority: Medium  . Essential hypertension 08/24/2007    Priority: Medium  . History of colonic polyps 06/13/2008    Priority: Low  . Allergic rhinitis 08/24/2007    Priority: Low   Past Surgical History  Procedure Laterality Date  . Cesarean section      x2    Family History  Problem Relation Age of Onset  . Prostate cancer Father     smoker  . Hypertension Father   . Ulcers Father   . Hypertension Mother     sister as well  . Gout Sister     brother    Medications- reviewed and updated Current Outpatient Prescriptions  Medication Sig Dispense Refill  . amLODipine (NORVASC) 5 MG tablet take 1 tablet by mouth once daily 90 tablet 3  . aspirin 81 MG tablet Take 81 mg by mouth daily.      Marland Kitchen atenolol (TENORMIN) 50 MG tablet take 1 tablet by mouth once daily 90 tablet 3  . Calcium Carbonate-Vitamin D (CALCIUM-VITAMIN D) 600-200 MG-UNIT CAPS Take by mouth daily.      Marland Kitchen ESTRACE VAGINAL 0.1 MG/GM vaginal cream PLACE 1.19 APPLICATORFULS VAGINALLY EVERY OTHER DAY 42.5 g 3  . Flaxseed, Linseed, (FLAX SEED OIL) 1000 MG CAPS Take 1,000 mg by mouth daily.    Marland Kitchen glucosamine-chondroitin 500-400 MG tablet Take 1 tablet by mouth 2 (two) times daily as needed.      Marland Kitchen losartan (COZAAR) 100 MG tablet take 1 tablet by  mouth once daily 30 tablet 11   No current facility-administered medications for this visit.    Allergies-reviewed and updated Allergies  Allergen Reactions  . Hydrochlorothiazide     REACTION: rash    History   Social History  . Marital Status: Married    Spouse Name: N/A    Number of Children: N/A  . Years of Education: N/A   Social History Main Topics  . Smoking status: Never Smoker   . Smokeless tobacco: None  . Alcohol Use: 4.2 oz/week    7 Not specified per week  . Drug Use: No  . Sexual Activity: None   Other Topics Concern  . None   Social History Narrative   Married. 2 living children, 1 died at birth. 2 granddaughter.     Lives with husband. 0 pets.       Former teacher-retired.       Hobbies: rummy cube (tile game), antiquing, cooking, gardening    ROS--See HPI   Objective: BP 120/82 mmHg  Temp(Src) 98.1 F (36.7 C)  Wt 146 lb (66.225 kg) Gen: NAD, resting comfortably in chair HEENT: Mucous membranes are moist. Oropharynx normal except some cobblestoning CV: RRR no murmurs rubs or gallops Lungs: CTAB no crackles, wheeze, rhonchi Abdomen: soft/nontender/nondistended/normal bowel sounds. Ext: no edema, 2+ PT pulses Skin: warm, dry, no rash Neuro: grossly normal, moves all extremities, PERRLA   Assessment/Plan:  Depression PHQ9 2 of 0. Patient seems to be controlling her depression with exercise. She stopped Wellbutrin in the summer due to poor sleep on the medication. I counseled patient and gave her strict return precautions as she has had recurrent bouts of depression in the past.  Allergic rhinitis I suspect patient's lingering symptoms from her cold over the summer are likely allergy related. I have advised her to try an over-the-counter antihistamine such as Claritin or Zyrtec for at least 1-2 months. She will follow-up if her symptoms persist.  Postmenopausal bleeding Patient age 58 and continues to have periods every 9-10 months at the latest. She's had 2 periods in the last year. No increasing frequency of these. We discussed potential ultrasound and endometrial biopsy in our office as well as [facility] testing versus referral to gynecology. Patient opts for referral to gynecology. I told her I cannot clearly say this is postmenopausal bleeding and she has never had a full year without a period but I'm concerned given her age and continued intermittent periods.  Essential hypertension Well-controlled today. Continue amlodipine 5 mg, atenolol 50 mg, losartan 100 mg.   F/u 6 months.   Orders Placed This Encounter  Procedures  . Ambulatory referral to Gynecology    Referral Priority:  Routine      Referral Type:  Consultation    Referral Reason:  Specialty Services Required    Requested Specialty:  Gynecology    Number of Visits Requested:  1

## 2014-11-21 NOTE — Assessment & Plan Note (Signed)
Well-controlled today. Continue amlodipine 5 mg, atenolol 50 mg, losartan 100 mg.

## 2014-11-21 NOTE — Assessment & Plan Note (Signed)
Patient age 58 and continues to have periods every 9-10 months at the latest. She's had 2 periods in the last year. No increasing frequency of these. We discussed potential ultrasound and endometrial biopsy in our office as well as [facility] testing versus referral to gynecology. Patient opts for referral to gynecology. I told her I cannot clearly say this is postmenopausal bleeding and she has never had a full year without a period but I'm concerned given her age and continued intermittent periods.

## 2014-11-25 ENCOUNTER — Other Ambulatory Visit: Payer: Self-pay

## 2014-11-25 MED ORDER — AMLODIPINE BESYLATE 5 MG PO TABS: 5.0000 mg | ORAL_TABLET | Freq: Every day | ORAL | Status: DC

## 2014-11-25 MED ORDER — ATENOLOL 50 MG PO TABS
50.0000 mg | ORAL_TABLET | Freq: Every day | ORAL | Status: DC
Start: 2014-11-25 — End: 2015-05-26

## 2014-11-25 MED ORDER — LOSARTAN POTASSIUM 100 MG PO TABS
100.0000 mg | ORAL_TABLET | Freq: Every day | ORAL | Status: DC
Start: 2014-11-25 — End: 2015-05-26

## 2014-11-25 NOTE — Telephone Encounter (Signed)
Rx for Amlodipine besylate 5 mg- Take 1 tablet by mouth once daily. #90  Request for Atenolol 50 mg tablet- Take 1 tablet by mouth once daily. #90  Request for Losartan Potassium 100 mg tab- Take 1 tablet by mouth once daily #90  Rx sent to pharmacy for 6 months.

## 2014-11-26 ENCOUNTER — Encounter: Payer: Self-pay | Admitting: Women's Health

## 2014-11-26 ENCOUNTER — Other Ambulatory Visit (HOSPITAL_COMMUNITY)
Admission: RE | Admit: 2014-11-26 | Discharge: 2014-11-26 | Disposition: A | Discharge status: Home / Self Care | Payer: BC Managed Care – PPO | Source: Ambulatory Visit | Attending: Women's Health | Admitting: Women's Health

## 2014-11-26 ENCOUNTER — Ambulatory Visit (INDEPENDENT_AMBULATORY_CARE_PROVIDER_SITE_OTHER): Payer: BC Managed Care – PPO | Admitting: Women's Health

## 2014-11-26 VITALS — BP 121/79 | Ht 66.0 in | Wt 145.6 lb

## 2014-11-26 DIAGNOSIS — Z01419 Encounter for gynecological examination (general) (routine) without abnormal findings: Secondary | ICD-10-CM | POA: Diagnosis present

## 2014-11-26 DIAGNOSIS — Z1151 Encounter for screening for human papillomavirus (HPV): Secondary | ICD-10-CM | POA: Diagnosis present

## 2014-11-26 DIAGNOSIS — N950 Postmenopausal bleeding: Secondary | ICD-10-CM

## 2014-11-26 NOTE — Patient Instructions (Signed)
Health Recommendations for Postmenopausal Women Respected and ongoing research has looked at the most common causes of death, disability, and poor quality of life in postmenopausal women. The causes include heart disease, diseases of blood vessels, diabetes, depression, cancer, and bone loss (osteoporosis). Many things can be done to help lower the chances of developing these and other common problems. CARDIOVASCULAR DISEASE Heart Disease: A heart attack is a medical emergency. Know the signs and symptoms of a heart attack. Below are things women can do to reduce their risk for heart disease.   Do not smoke. If you smoke, quit.  Aim for a healthy weight. Being overweight causes many preventable deaths. Eat a healthy and balanced diet and drink an adequate amount of liquids.  Get moving. Make a commitment to be more physically active. Aim for 30 minutes of activity on most, if not all days of the week.  Eat for heart health. Choose a diet that is low in saturated fat and cholesterol and eliminate trans fat. Include whole grains, vegetables, and fruits. Read and understand the labels on food containers before buying.  Know your numbers. Ask your caregiver to check your blood pressure, cholesterol (total, HDL, LDL, triglycerides) and blood glucose. Work with your caregiver on improving your entire clinical picture.  High blood pressure. Limit or stop your table salt intake (try salt substitute and food seasonings). Avoid salty foods and drinks. Read labels on food containers before buying. Eating well and exercising can help control high blood pressure. STROKE  Stroke is a medical emergency. Stroke may be the result of a blood clot in a blood vessel in the brain or by a brain hemorrhage (bleeding). Know the signs and symptoms of a stroke. To lower the risk of developing a stroke:  Avoid fatty foods.  Quit smoking.  Control your diabetes, blood pressure, and irregular heart rate. THROMBOPHLEBITIS  (BLOOD CLOT) OF THE LEG  Becoming overweight and leading a stationary lifestyle may also contribute to developing blood clots. Controlling your diet and exercising will help lower the risk of developing blood clots. CANCER SCREENING  Breast Cancer: Take steps to reduce your risk of breast cancer.  You should practice "breast self-awareness." This means understanding the normal appearance and feel of your breasts and should include breast self-examination. Any changes detected, no matter how small, should be reported to your caregiver.  After age 40, you should have a clinical breast exam (CBE) every year.  Starting at age 40, you should consider having a mammogram (breast X-ray) every year.  If you have a family history of breast cancer, talk to your caregiver about genetic screening.  If you are at high risk for breast cancer, talk to your caregiver about having an MRI and a mammogram every year.  Intestinal or Stomach Cancer: Tests to consider are a rectal exam, fecal occult blood, sigmoidoscopy, and colonoscopy. Women who are high risk may need to be screened at an earlier age and more often.  Cervical Cancer:  Beginning at age 30, you should have a Pap test every 3 years as long as the past 3 Pap tests have been normal.  If you have had past treatment for cervical cancer or a condition that could lead to cancer, you need Pap tests and screening for cancer for at least 20 years after your treatment.  If you had a hysterectomy for a problem that was not cancer or a condition that could lead to cancer, then you no longer need Pap tests.    If you are between ages 65 and 70, and you have had normal Pap tests going back 10 years, you no longer need Pap tests.  If Pap tests have been discontinued, risk factors (such as a new sexual partner) need to be reassessed to determine if screening should be resumed.  Some medical problems can increase the chance of getting cervical cancer. In these  cases, your caregiver may recommend more frequent screening and Pap tests.  Uterine Cancer: If you have vaginal bleeding after reaching menopause, you should notify your caregiver.  Ovarian Cancer: Other than yearly pelvic exams, there are no reliable tests available to screen for ovarian cancer at this time except for yearly pelvic exams.  Lung Cancer: Yearly chest X-rays can detect lung cancer and should be done on high risk women, such as cigarette smokers and women with chronic lung disease (emphysema).  Skin Cancer: A complete body skin exam should be done at your yearly examination. Avoid overexposure to the sun and ultraviolet light lamps. Use a strong sun block cream when in the sun. All of these things are important for lowering the risk of skin cancer. MENOPAUSE Menopause Symptoms: Hormone therapy products are effective for treating symptoms associated with menopause:  Moderate to severe hot flashes.  Night sweats.  Mood swings.  Headaches.  Tiredness.  Loss of sex drive.  Insomnia.  Other symptoms. Hormone replacement carries certain risks, especially in older women. Women who use or are thinking about using estrogen or estrogen with progestin treatments should discuss that with their caregiver. Your caregiver will help you understand the benefits and risks. The ideal dose of hormone replacement therapy is not known. The Food and Drug Administration (FDA) has concluded that hormone therapy should be used only at the lowest doses and for the shortest amount of time to reach treatment goals.  OSTEOPOROSIS Protecting Against Bone Loss and Preventing Fracture If you use hormone therapy for prevention of bone loss (osteoporosis), the risks for bone loss must outweigh the risk of the therapy. Ask your caregiver about other medications known to be safe and effective for preventing bone loss and fractures. To guard against bone loss or fractures, the following is recommended:  If  you are younger than age 50, take 1000 mg of calcium and at least 600 mg of Vitamin D per day.  If you are older than age 50 but younger than age 70, take 1200 mg of calcium and at least 600 mg of Vitamin D per day.  If you are older than age 70, take 1200 mg of calcium and at least 800 mg of Vitamin D per day. Smoking and excessive alcohol intake increases the risk of osteoporosis. Eat foods rich in calcium and vitamin D and do weight bearing exercises several times a week as your caregiver suggests. DIABETES Diabetes Mellitus: If you have type I or type 2 diabetes, you should keep your blood sugar under control with diet, exercise, and recommended medication. Avoid starchy and fatty foods, and too many sweets. Being overweight can make diabetes control more difficult. COGNITION AND MEMORY Cognition and Memory: Menopausal hormone therapy is not recommended for the prevention of cognitive disorders such as Alzheimer's disease or memory loss.  DEPRESSION  Depression may occur at any age, but it is common in elderly women. This may be because of physical, medical, social (loneliness), or financial problems and needs. If you are experiencing depression because of medical problems and control of symptoms, talk to your caregiver about this. Physical   activity and exercise may help with mood and sleep. Community and volunteer involvement may improve your sense of value and worth. If you have depression and you feel that the problem is getting worse or becoming severe, talk to your caregiver about which treatment options are best for you. ACCIDENTS  Accidents are common and can be serious in elderly woman. Prepare your house to prevent accidents. Eliminate throw rugs, place hand bars in bath, shower, and toilet areas. Avoid wearing high heeled shoes or walking on wet, snowy, and icy areas. Limit or stop driving if you have vision or hearing problems, or if you feel you are unsteady with your movements and  reflexes. HEPATITIS C Hepatitis C is a type of viral infection affecting the liver. It is spread mainly through contact with blood from an infected person. It can be treated, but if left untreated, it can lead to severe liver damage over the years. Many people who are infected do not know that the virus is in their blood. If you are a "baby-boomer", it is recommended that you have one screening test for Hepatitis C. IMMUNIZATIONS  Several immunizations are important to consider having during your senior years, including:   Tetanus, diphtheria, and pertussis booster shot.  Influenza every year before the flu season begins.  Pneumonia vaccine.  Shingles vaccine.  Others, as indicated based on your specific needs. Talk to your caregiver about these. Document Released: 01/28/2006 Document Revised: 04/22/2014 Document Reviewed: 09/23/2008 ExitCare Patient Information 2015 [employer]. This information is not intended to replace advice given to you by your health care provider. Make sure you discuss any questions you have with your health care provider.  

## 2014-11-26 NOTE — Progress Notes (Signed)
Sharon Medina Jul 19, 1956 [id number]    History:    Presents for annual exam.  Normal Pap and mammogram history. Reports having cycles every 6-8 months, never stopped cycles. Has not had a [place], reports numerous hot flashes 2 years ago that have now basically resolved.  Using Estrace vaginal cream 1 mg twice weekly vaginally per primary care for past 2 years. Hypertension labs and meds managed by primary care. Colonoscopy 2007, hyperplastic polyp. Current on vaccinations.  Past medical history, past surgical history, family history and social history were all reviewed and documented in the EPIC chart. Retired Pharmacist, hospital. Mother, sister hysterectomy's in their 22s for benign reasons.  ROS:  A  12 point ROS was performed and pertinent positives and negatives are included.  Exam:  Filed Vitals:   11/26/14 1214  BP: 121/79    General appearance:  Normal Thyroid:  Symmetrical, normal in size, without palpable masses or nodularity. Respiratory  Auscultation:  Clear without wheezing or rhonchi Cardiovascular  Auscultation:  Regular rate, without rubs, murmurs or gallops  Edema/varicosities:  Not grossly evident Abdominal  Soft,nontender, without masses, guarding or rebound.  Liver/spleen:  No organomegaly noted  Hernia:  None appreciated  Skin  Inspection:  Grossly normal   Breasts: Examined lying and sitting.     Right: Without masses, retractions, discharge or axillary adenopathy.     Left: Without masses, retractions, discharge or axillary adenopathy. Gentitourinary   Inguinal/mons:  Normal without inguinal adenopathy  External genitalia:  Normal  BUS/Urethra/Skene's glands:  Normal  Vagina:  Normal  Cervix:  Normal  Uterus:   normal in size, shape and contour.  Midline and mobile  Adnexa/parametria:     Rt: Without masses or tenderness.   Lt: Without masses or tenderness.  Anus and perineum: Normal  Digital rectal exam: Normal sphincter tone without palpated masses or  tenderness  Assessment/Plan:  58 y.o. MWF  for annual exam.    Questionable postmenopausal bleeding on Estrace vaginal cream 1 mg twice weekly Hypertension managed by primary care labs and meds  Plan: Ultrasound, to check endometrial thickness. FSH, vitamin D, Pap with HR HPV typing. New screening guidelines reviewed. SBE's, continue annual 3-D tomography mammogram history of dense breasts, calcium rich diet, vitamin D 2000 daily encouraged. Home safety and fall prevention discussed. Instructed to call if any further bleeding.    Huel Cote WHNP, 1:27 PM 11/26/2014

## 2014-11-27 LAB — FOLLICLE STIMULATING HORMONE: FSH: 39.8 m[IU]/mL

## 2014-11-27 LAB — VITAMIN D 25 HYDROXY (VIT D DEFICIENCY, FRACTURES): VIT D 25 HYDROXY: 39 ng/mL (ref 30–100)

## 2014-11-28 LAB — CYTOLOGY - PAP

## 2014-12-05 ENCOUNTER — Encounter: Payer: Self-pay | Admitting: Women's Health

## 2014-12-05 ENCOUNTER — Ambulatory Visit: Payer: BC Managed Care – PPO

## 2014-12-05 ENCOUNTER — Ambulatory Visit (INDEPENDENT_AMBULATORY_CARE_PROVIDER_SITE_OTHER): Payer: BC Managed Care – PPO | Admitting: Women's Health

## 2014-12-05 ENCOUNTER — Other Ambulatory Visit: Payer: Self-pay | Admitting: Women's Health

## 2014-12-05 VITALS — BP 130/80 | Ht 66.0 in | Wt 145.0 lb

## 2014-12-05 DIAGNOSIS — Z79890 Hormone replacement therapy (postmenopausal): Secondary | ICD-10-CM

## 2014-12-05 DIAGNOSIS — N950 Postmenopausal bleeding: Secondary | ICD-10-CM

## 2014-12-05 DIAGNOSIS — N8320 Unspecified ovarian cysts: Secondary | ICD-10-CM

## 2014-12-05 DIAGNOSIS — N83201 Unspecified ovarian cyst, right side: Secondary | ICD-10-CM

## 2014-12-05 MED ORDER — ESTRADIOL 0.1 MG/GM VA CREA: TOPICAL_CREAM | VAGINAL | Status: DC

## 2014-12-05 NOTE — Patient Instructions (Signed)
Ovarian Cyst °An ovarian cyst is a sac filled with fluid or blood. This sac is attached to the ovary. Some cysts go away on their own. Other cysts need treatment.  °HOME CARE  °· Only take medicine as told by your doctor. °· Follow up with your doctor as told. °· Get regular pelvic exams and Pap tests. °GET HELP IF: °· Your periods are late, not regular, or painful. °· You stop having periods. °· Your belly (abdominal) or pelvic pain does not go away. °· Your belly becomes large or puffy (swollen). °· You have a hard time peeing (totally emptying your bladder). °· You have pressure on your bladder. °· You have pain during sex. °· You feel fullness, pressure, or discomfort in your belly. °· You lose weight for no reason. °· You feel sick most of the time. °· You have a hard time pooping (constipation). °· You do not feel like eating. °· You develop pimples (acne). °· You have an increase in hair on your body and face. °· You are gaining weight for no reason. °· You think you are pregnant. °GET HELP RIGHT AWAY IF:  °· Your belly pain gets worse. °· You feel sick to your stomach (nauseous), and you throw up (vomit). °· You have a fever that comes on fast. °· You have belly pain while pooping (bowel movement). °· Your periods are heavier than usual. °MAKE SURE YOU:  °· Understand these instructions. °· Will watch your condition. °· Will get help right away if you are not doing well or get worse. °Document Released: 05/24/2008 Document Revised: 09/26/2013 Document Reviewed: 08/13/2013 °ExitCare® Patient Information ©2015 [employer]. This information is not intended to replace advice given to you by your health care provider. Make sure you discuss any questions you have with your health care provider. ° °

## 2014-12-05 NOTE — Progress Notes (Signed)
Patient ID: Sharon Medina, female   DOB: Aug 19, 1956, 58 y.o.   MRN: [id number] Presents for ultrasound. At new patient annual exam had stated had several periods annually. Primary care had prescribed vaginal estrogen cream for dryness. Had been using twice weekly 1 g. Good relief of vaginal dryness, itching, irritation. [place] elevated 11/2014. Hypertension managed by primary care.  Exam: Appears well. Ultrasound: Anteverted uterus heterogeneous echo. Right ovary atrophic with thin-walled cyst internal low level echoes avascular 12 x 5 mm. Left ovary normal atrophic. Negative cul-de-sac. Endometrium 2.7 millimeters.  Thin endometrium Small right ovarian cyst  Plan: Instructed to call if any further bleeding. Will continue Estrace vaginal cream twice weekly. Did review some systemic absorption which may have caused some of the bleeding in the past. CA 125 pending, reviewed most likely a benign cyst.

## 2014-12-06 ENCOUNTER — Other Ambulatory Visit: Payer: Self-pay | Admitting: Women's Health

## 2014-12-06 DIAGNOSIS — N83201 Unspecified ovarian cyst, right side: Secondary | ICD-10-CM

## 2014-12-06 LAB — CA 125: CA 125: 14 U/mL (ref <35)

## 2015-02-20 ENCOUNTER — Ambulatory Visit (INDEPENDENT_AMBULATORY_CARE_PROVIDER_SITE_OTHER): Payer: BC Managed Care – PPO

## 2015-02-20 ENCOUNTER — Ambulatory Visit (INDEPENDENT_AMBULATORY_CARE_PROVIDER_SITE_OTHER): Payer: BC Managed Care – PPO | Admitting: Women's Health

## 2015-02-20 ENCOUNTER — Encounter: Payer: Self-pay | Admitting: Women's Health

## 2015-02-20 VITALS — BP 128/80

## 2015-02-20 DIAGNOSIS — N8320 Unspecified ovarian cysts: Secondary | ICD-10-CM | POA: Diagnosis not present

## 2015-02-20 DIAGNOSIS — N83201 Unspecified ovarian cyst, right side: Secondary | ICD-10-CM

## 2015-02-20 NOTE — Progress Notes (Signed)
Patient ID: Sharon Medina, female   DOB: [birth year], 59 y.o.   MRN: [id number] Presents for follow-up of coincidental finding of 12x.5 mm thin walled right ovarian cyst with US done for postmenopausal bleeding 12/05/14.  CA 0125  14. Endometrial stripe 2.7 mm. No bleeding since. Denies abdominal pain, urinary symptoms or discharge.  Exam: Appears well. Ultrasound: Transvaginal uterus anteverted intramural fibroid 12 x 11 mm. Endometrium within normal limits, endometrium 2.6 mm. Right ovary thick-walled hypoechogenic 12 x 10 mm collapsed wall cyst avascular. Left ovary normal. Negative cul-de-sac.  Persistent small right ovarian cyst  Plan: CA-125 if remains low, will watch, instructed to call if any further bleeding.

## 2015-02-20 NOTE — Patient Instructions (Signed)
Ovarian Cyst An ovarian cyst is a fluid-filled sac that forms on an ovary. The ovaries are small organs that produce eggs in women. Various types of cysts can form on the ovaries. Most are not cancerous. Many do not cause problems, and they often go away on their own. Some may cause symptoms and require treatment. Common types of ovarian cysts include:  Functional cysts--These cysts may occur every month during the menstrual cycle. This is normal. The cysts usually go away with the next menstrual cycle if the woman does not get pregnant. Usually, there are no symptoms with a functional cyst.  Endometrioma cysts--These cysts form from the tissue that lines the uterus. They are also called "chocolate cysts" because they become filled with blood that turns brown. This type of cyst can cause pain in the lower abdomen during intercourse and with your menstrual period.  Cystadenoma cysts--This type develops from the cells on the outside of the ovary. These cysts can get very big and cause lower abdomen pain and pain with intercourse. This type of cyst can twist on itself, cut off its blood supply, and cause severe pain. It can also easily rupture and cause a lot of pain.  Dermoid cysts--This type of cyst is sometimes found in both ovaries. These cysts may contain different kinds of body tissue, such as skin, teeth, hair, or cartilage. They usually do not cause symptoms unless they get very big.  Theca lutein cysts--These cysts occur when too much of a certain hormone (human chorionic gonadotropin) is produced and overstimulates the ovaries to produce an egg. This is most common after procedures used to assist with the conception of a baby (in vitro fertilization). CAUSES   Fertility drugs can cause a condition in which multiple large cysts are formed on the ovaries. This is called ovarian hyperstimulation syndrome.  A condition called polycystic ovary syndrome can cause hormonal imbalances that can lead to  nonfunctional ovarian cysts. SIGNS AND SYMPTOMS  Many ovarian cysts do not cause symptoms. If symptoms are present, they may include:  Pelvic pain or pressure.  Pain in the lower abdomen.  Pain during sexual intercourse.  Increasing girth (swelling) of the abdomen.  Abnormal menstrual periods.  Increasing pain with menstrual periods.  Stopping having menstrual periods without being pregnant. DIAGNOSIS  These cysts are commonly found during a routine or annual pelvic exam. Tests may be ordered to find out more about the cyst. These tests may include:  Ultrasound.  X-ray of the pelvis.  CT scan.  MRI.  Blood tests. TREATMENT  Many ovarian cysts go away on their own without treatment. Your health care provider may want to check your cyst regularly for 2-3 months to see if it changes. For women in menopause, it is particularly important to monitor a cyst closely because of the higher rate of ovarian cancer in menopausal women. When treatment is needed, it may include any of the following:  A procedure to drain the cyst (aspiration). This may be done using a long needle and ultrasound. It can also be done through a laparoscopic procedure. This involves using a thin, lighted tube with a tiny camera on the end (laparoscope) inserted through a small incision.  Surgery to remove the whole cyst. This may be done using laparoscopic surgery or an open surgery involving a larger incision in the lower abdomen.  Hormone treatment or birth control pills. These methods are sometimes used to help dissolve a cyst. HOME CARE INSTRUCTIONS   Only take over-the-counter   or prescription medicines as directed by your health care provider.  Follow up with your health care provider as directed.  Get regular pelvic exams and Pap tests. SEEK MEDICAL CARE IF:   Your periods are late, irregular, or painful, or they stop.  Your pelvic pain or abdominal pain does not go away.  Your abdomen becomes  larger or swollen.  You have pressure on your bladder or trouble emptying your bladder completely.  You have pain during sexual intercourse.  You have feelings of fullness, pressure, or discomfort in your stomach.  You lose weight for no apparent reason.  You feel generally ill.  You become constipated.  You lose your appetite.  You develop acne.  You have an increase in body and facial hair.  You are gaining weight, without changing your exercise and eating habits.  You think you are pregnant. SEEK IMMEDIATE MEDICAL CARE IF:   You have increasing abdominal pain.  You feel sick to your stomach (nauseous), and you throw up (vomit).  You develop a fever that comes on suddenly.  You have abdominal pain during a bowel movement.  Your menstrual periods become heavier than usual. MAKE SURE YOU:  Understand these instructions.  Will watch your condition.  Will get help right away if you are not doing well or get worse. Document Released: 12/06/2005 Document Revised: 12/11/2013 Document Reviewed: 08/13/2013 ExitCare Patient Information 2015 [employer]. This information is not intended to replace advice given to you by your health care provider. Make sure you discuss any questions you have with your health care provider.  

## 2015-02-21 LAB — CA 125: CA 125: 14 U/mL (ref <35)

## 2015-05-23 ENCOUNTER — Encounter: Payer: Self-pay | Admitting: Family Medicine

## 2015-05-23 ENCOUNTER — Ambulatory Visit (INDEPENDENT_AMBULATORY_CARE_PROVIDER_SITE_OTHER): Payer: BC Managed Care – PPO | Admitting: Family Medicine

## 2015-05-23 ENCOUNTER — Ambulatory Visit: Payer: BC Managed Care – PPO | Admitting: Family Medicine

## 2015-05-23 VITALS — BP 130/82 | HR 62 | Temp 98.7°F | Wt 148.0 lb

## 2015-05-23 DIAGNOSIS — I10 Essential (primary) hypertension: Secondary | ICD-10-CM | POA: Diagnosis not present

## 2015-05-23 DIAGNOSIS — R1310 Dysphagia, unspecified: Secondary | ICD-10-CM

## 2015-05-23 DIAGNOSIS — J029 Acute pharyngitis, unspecified: Secondary | ICD-10-CM

## 2015-05-23 NOTE — Progress Notes (Signed)
Pre visit review using our clinic review tool, if applicable. No additional management support is needed unless otherwise documented below in the visit note. 

## 2015-05-23 NOTE — Progress Notes (Signed)
Garret Reddish, MD  Subjective:  Sharon Medina is a 59 y.o. year old very pleasant female patient who presents with:  Hypertension-controlled  BP Readings from Last 3 Encounters:  05/23/15 100/78  02/20/15 128/80  12/05/14 130/80   Home BP monitoring-no Compliant with medications-yes without side effects ROS-Denies any CP, HA, SOB, blurry vision, LE edema   Past Medical History- depression, HTN, history colon polyps  Medications- reviewed and updated Current Outpatient Prescriptions  Medication Sig Dispense Refill  . amLODipine (NORVASC) 5 MG tablet Take 1 tablet (5 mg total) by mouth daily. 90 tablet 1  . aspirin 81 MG tablet Take 81 mg by mouth daily.      Marland Kitchen atenolol (TENORMIN) 50 MG tablet Take 1 tablet (50 mg total) by mouth daily. 90 tablet 1  . Calcium Carbonate-Vitamin D (CALCIUM-VITAMIN D) 600-200 MG-UNIT CAPS Take by mouth daily.      . Flaxseed, Linseed, (FLAX SEED OIL) 1000 MG CAPS Take 1,000 mg by mouth daily.    Marland Kitchen glucosamine-chondroitin 500-400 MG tablet Take 1 tablet by mouth 2 (two) times daily as needed.      Marland Kitchen losartan (COZAAR) 100 MG tablet Take 1 tablet (100 mg total) by mouth daily. 90 tablet 1   Objective: BP 130/82 mmHg  Pulse 62  Temp(Src) 98.7 F (37.1 C) (Oral)  Wt 148 lb (67.132 kg)  LMP 10/02/2014 Gen: NAD, resting comfortably Pharynx and tonsils with mild erythema, turbinates and TM normal CV: RRR no murmurs rubs or gallops Lungs: CTAB no crackles, wheeze, rhonchi Abdomen: soft/nontender/nondistended/normal bowel sounds. No rebound or guarding.  Ext: no edema Skin: warm, dry, no rash Neuro: grossly normal, moves all extremities   Assessment/Plan:  Essential hypertension Well controlled. Continue current meds: Amlodipine 5 mg, atenolol 50 mg, losartan 100 mg    S: Patient from last summer continues to have an interesting constellation of symptoms including Trouble swallowing occasionally, shortness of breath at times with exercise,  intermittent sore throat for over a year. Symptoms improved on daily zyrtec but have not resolved. States she has always had the SOB issue for years and years with running/walking but not with swimming. Sore throat comes at times. No unintentional weight loss, abnormal fatigue, night sweats.  A/P: could still be allergies and could consider flonase but not overly impressed with exam. Patient has a hard time explaining symptoms and could almost be described as globus like sensation with the swallowing issue. Discussed ENT refer, pulm as options but thought likely low yield. Decided to try prn use of anti reflux medicines and see if any improvement and consider longer trial if needed. Symptoms not progressive which is reassuring.  Return precautions advised.   Follow up 6 months for CPE

## 2015-05-23 NOTE — Assessment & Plan Note (Addendum)
Well controlled. Continue current meds: Amlodipine 5 mg, atenolol 50 mg, losartan 100 mg

## 2015-05-23 NOTE — Patient Instructions (Addendum)
BP looks great. No changes today  Wonder if reflux could be contributing to your symptoms (scratchy throat intermittently, some feelings of being winded with exercisese, trouble swallowing). Glad the zyrtec has helped some. Let's try tums, or prilosec or zantac just as needed to see if that helps. May consider longer trial.   Schedule physical for 6-9 months

## 2015-05-26 ENCOUNTER — Other Ambulatory Visit: Payer: Self-pay | Admitting: *Deleted

## 2015-05-26 MED ORDER — ATENOLOL 50 MG PO TABS: 50.0000 mg | ORAL_TABLET | Freq: Every day | ORAL | Status: DC

## 2015-05-26 MED ORDER — AMLODIPINE BESYLATE 5 MG PO TABS: 5.0000 mg | ORAL_TABLET | Freq: Every day | ORAL | Status: DC

## 2015-05-26 MED ORDER — LOSARTAN POTASSIUM 100 MG PO TABS: 100.0000 mg | ORAL_TABLET | Freq: Every day | ORAL | Status: DC

## 2015-06-11 ENCOUNTER — Encounter: Payer: Self-pay | Admitting: Family Medicine

## 2015-10-03 ENCOUNTER — Other Ambulatory Visit: Payer: Self-pay

## 2015-10-03 DIAGNOSIS — Z1231 Encounter for screening mammogram for malignant neoplasm of breast: Secondary | ICD-10-CM

## 2015-10-16 ENCOUNTER — Ambulatory Visit (INDEPENDENT_AMBULATORY_CARE_PROVIDER_SITE_OTHER): Payer: BC Managed Care – PPO | Admitting: Family Medicine

## 2015-10-16 DIAGNOSIS — Z23 Encounter for immunization: Secondary | ICD-10-CM | POA: Diagnosis not present

## 2015-11-05 ENCOUNTER — Ambulatory Visit
Admission: RE | Admit: 2015-11-05 | Discharge: 2015-11-05 | Disposition: A | Discharge status: Home / Self Care | Payer: BC Managed Care – PPO | Source: Ambulatory Visit

## 2015-11-05 DIAGNOSIS — Z1231 Encounter for screening mammogram for malignant neoplasm of breast: Secondary | ICD-10-CM

## 2015-11-18 ENCOUNTER — Other Ambulatory Visit: Payer: Self-pay | Admitting: Family Medicine

## 2015-11-20 ENCOUNTER — Other Ambulatory Visit (INDEPENDENT_AMBULATORY_CARE_PROVIDER_SITE_OTHER): Payer: BC Managed Care – PPO

## 2015-11-20 DIAGNOSIS — Z0000 Encounter for general adult medical examination without abnormal findings: Secondary | ICD-10-CM | POA: Diagnosis not present

## 2015-11-20 LAB — CBC WITH DIFFERENTIAL/PLATELET
BASOS ABS: 0 10*3/uL (ref 0.0–0.1)
BASOS PCT: 0.9 % (ref 0.0–3.0)
Eosinophils Absolute: 0.2 10*3/uL (ref 0.0–0.7)
Eosinophils Relative: 4.4 % (ref 0.0–5.0)
HEMATOCRIT: 40.7 % (ref 36.0–46.0)
Hemoglobin: 13.5 g/dL (ref 12.0–15.0)
LYMPHS PCT: 37.4 % (ref 12.0–46.0)
Lymphs Abs: 2 10*3/uL (ref 0.7–4.0)
MCHC: 33.3 g/dL (ref 30.0–36.0)
MCV: 89.7 fl (ref 78.0–100.0)
MONOS PCT: 8.2 % (ref 3.0–12.0)
Monocytes Absolute: 0.4 10*3/uL (ref 0.1–1.0)
NEUTROS ABS: 2.7 10*3/uL (ref 1.4–7.7)
Neutrophils Relative %: 49.1 % (ref 43.0–77.0)
PLATELETS: 252 10*3/uL (ref 150.0–400.0)
RBC: 4.54 Mil/uL (ref 3.87–5.11)
RDW: 13.7 % (ref 11.5–15.5)
WBC: 5.5 10*3/uL (ref 4.0–10.5)

## 2015-11-20 LAB — LIPID PANEL
CHOL/HDL RATIO: 3
CHOLESTEROL: 185 mg/dL (ref 0–200)
HDL: 72.8 mg/dL (ref >39.00)
LDL CALC: 102 mg/dL — AB (ref 0–99)
NONHDL: 112.44
Triglycerides: 51 mg/dL (ref 0.0–149.0)
VLDL: 10.2 mg/dL (ref 0.0–40.0)

## 2015-11-20 LAB — POCT URINALYSIS DIPSTICK
BILIRUBIN UA: NEGATIVE
GLUCOSE UA: NEGATIVE
KETONES UA: NEGATIVE
Leukocytes, UA: NEGATIVE
Nitrite, UA: NEGATIVE
Protein, UA: NEGATIVE
RBC UA: NEGATIVE
SPEC GRAV UA: 1.015
UROBILINOGEN UA: 0.2
pH, UA: 7

## 2015-11-20 LAB — BASIC METABOLIC PANEL
BUN: 17 mg/dL (ref 6–23)
CALCIUM: 9.3 mg/dL (ref 8.4–10.5)
CO2: 31 meq/L (ref 19–32)
CREATININE: 0.87 mg/dL (ref 0.40–1.20)
Chloride: 104 mEq/L (ref 96–112)
GFR: 70.71 mL/min (ref >60.00)
GLUCOSE: 87 mg/dL (ref 70–99)
Potassium: 4.8 mEq/L (ref 3.5–5.1)
Sodium: 141 mEq/L (ref 135–145)

## 2015-11-20 LAB — HEPATIC FUNCTION PANEL
ALBUMIN: 4 g/dL (ref 3.5–5.2)
ALK PHOS: 50 U/L (ref 39–117)
ALT: 15 U/L (ref 0–35)
AST: 20 U/L (ref 0–37)
Bilirubin, Direct: 0.1 mg/dL (ref 0.0–0.3)
TOTAL PROTEIN: 6.4 g/dL (ref 6.0–8.3)
Total Bilirubin: 0.5 mg/dL (ref 0.2–1.2)

## 2015-11-20 LAB — TSH: TSH: 2.36 u[IU]/mL (ref 0.35–4.50)

## 2015-11-24 ENCOUNTER — Encounter: Payer: Self-pay | Admitting: Family Medicine

## 2015-11-24 ENCOUNTER — Ambulatory Visit (INDEPENDENT_AMBULATORY_CARE_PROVIDER_SITE_OTHER): Payer: BC Managed Care – PPO | Admitting: Family Medicine

## 2015-11-24 VITALS — BP 104/70 | Temp 98.6°F | Ht 66.0 in | Wt 147.0 lb

## 2015-11-24 DIAGNOSIS — F32A Depression, unspecified: Secondary | ICD-10-CM

## 2015-11-24 DIAGNOSIS — I10 Essential (primary) hypertension: Secondary | ICD-10-CM | POA: Diagnosis not present

## 2015-11-24 DIAGNOSIS — F329 Major depressive disorder, single episode, unspecified: Secondary | ICD-10-CM

## 2015-11-24 DIAGNOSIS — Z0000 Encounter for general adult medical examination without abnormal findings: Secondary | ICD-10-CM

## 2015-11-24 DIAGNOSIS — Z86010 Personal history of colonic polyps: Secondary | ICD-10-CM | POA: Diagnosis not present

## 2015-11-24 MED ORDER — BUPROPION HCL ER (XL) 150 MG PO TB24: 150.0000 mg | ORAL_TABLET | Freq: Every day | ORAL | Status: DC

## 2015-11-24 NOTE — Progress Notes (Signed)
Garret Reddish, MD Phone: [phone]  Subjective:  Patient presents today for their annual physical. Chief complaint-noted.   See problem oriented charting and wellness exam ROS- full  review of systems was completed and negative except for: depressed mood, anhednoia, energy and slep issues  The following were reviewed and entered/updated in epic: Past Medical History  Diagnosis Date  . Postmenopausal HRT (hormone replacement therapy)   . Allergy     allegic rhinitis  . Hypertension   . Depression   . History of colon polyps    Patient Active Problem List   Diagnosis Date Noted  . Postmenopausal bleeding 11/21/2014    Priority: Medium  . Depression 08/24/2007    Priority: Medium  . Essential hypertension 08/24/2007    Priority: Medium  . History of colonic polyps 06/13/2008    Priority: Low  . Allergic rhinitis 08/24/2007    Priority: Low   Past Surgical History  Procedure Laterality Date  . Cesarean section      x2    Family History  Problem Relation Age of Onset  . Prostate cancer Father     smoker  . Hypertension Father   . Ulcers Father   . Hypertension Mother     sister as well  . Gout Sister     brother  . Colon cancer Paternal Aunt     Medications- reviewed and updated Current Outpatient Prescriptions  Medication Sig Dispense Refill  . amLODipine (NORVASC) 5 MG tablet take 1 tablet by mouth once daily 90 tablet 3  . aspirin 81 MG tablet Take 81 mg by mouth daily.      Marland Kitchen atenolol (TENORMIN) 50 MG tablet take 1 tablet by mouth once daily 90 tablet 2  . Calcium Carbonate-Vitamin D (CALCIUM-VITAMIN D) 600-200 MG-UNIT CAPS Take by mouth daily.      . Flaxseed, Linseed, (FLAX SEED OIL) 1000 MG CAPS Take 1,000 mg by mouth daily.    Marland Kitchen glucosamine-chondroitin 500-400 MG tablet Take 1 tablet by mouth 2 (two) times daily as needed.      Marland Kitchen losartan (COZAAR) 100 MG tablet take 1 tablet by mouth once daily 90 tablet 2   Allergies-reviewed and  updated Allergies  Allergen Reactions  . Hydrochlorothiazide     REACTION: rash    Social History   Social History  . Marital Status: Married    Spouse Name: N/A  . Number of Children: N/A  . Years of Education: N/A   Social History Main Topics  . Smoking status: Never Smoker   . Smokeless tobacco: None  . Alcohol Use: 4.2 oz/week    7 Standard drinks or equivalent per week  . Drug Use: No  . Sexual Activity: Yes   Other Topics Concern  . None   Social History Narrative   Married. 2 living children, 1 died at birth. 2 granddaughter.    Lives with husband. 0 pets.       Former teacher-retired.    Working part time with [place]- [employer] etc. Stressful boss.       Hobbies: rummy cube (tile game), antiquing, cooking, gardening    ROS--See HPI   Objective: BP 104/70 mmHg  Temp(Src) 98.6 F (37 C)  Ht 5\' 6"  (1.676 m)  Wt 147 lb (66.679 kg)  BMI 23.74 kg/m2  LMP 10/02/2014 Gen: NAD, resting comfortably HEENT: Mucous membranes are moist. Oropharynx normal Neck: no thyromegaly CV: RRR no murmurs rubs or gallops Lungs: CTAB no crackles, wheeze, rhonchi Abdomen: soft/nontender/nondistended/normal  bowel sounds. No rebound or guarding.  Ext: no edema Skin: warm, dry, no worrisome skin lesions on visible skin Neuro: grossly normal, moves all extremities, PERRLA  Breast and GYN declined  Assessment/Plan:  59 y.o. female presenting for annual physical.  Health Maintenance counseling: 1. Anticipatory guidance: Patient counseled regarding regular dental exams, eye exams wearing seatbelts.  2. Risk factor reduction:  Advised patient of need for regular exercise and diet rich and fruits and vegetables to reduce risk of heart attack and stroke.  3. Immunizations/screenings/ancillary studies Health Maintenance Due  Topic Date Due  . Hepatitis C Screening -next bloodwork set 01[birth year]10/56  . HIV Screening -next bloodwork set 05/28/1971   4. Cervical cancer  screening- 11/26/14 with 3 year repeat 5. Breast cancer screening-  breast exam with GYN and mammogram 11/05/15 6. Colon cancer screening - 10/21/2006 with follow up next year  Hypertension  S: controlled A/P: continue losartan 100, amlodipine 5, atenolol 50  Depression S: poor control with phq9 of 12. Does not like sexual SE of SSRI so asks for wellbutrin A/P: rx for wellbutrin 150 provided with 1 month follow up planned   at times has anxiety and will feel globus sensation in upper throat. Sometimes after meals as well. No SI/HI. Warning precautions given for this.   Return precautions advised.   Paternal aunt colon cancer- requests sooner than 10 year follow up- 9 years at present Orders Placed This Encounter  Procedures  . Ambulatory referral to Gastroenterology    Referral Priority:  Routine    Referral Type:  Consultation    Referral Reason:  Specialty Services Required    Number of Visits Requested:  1    Meds ordered this encounter  Medications  . buPROPion (WELLBUTRIN XL) 150 MG 24 hr tablet    Sig: Take 1 tablet (150 mg total) by mouth daily.    Dispense:  30 tablet    Refill:  5

## 2015-11-24 NOTE — Patient Instructions (Signed)
Restart wellbutrin at 150mg   Otherwise things look great today  Follow up 4-6 weeks for repeat depression evaluation

## 2015-11-24 NOTE — Assessment & Plan Note (Signed)
S: poor control with phq9 of 12. Does not like sexual SE of SSRI so asks for wellbutrin A/P: rx for wellbutrin 150 provided with 1 month follow up planned

## 2015-11-28 ENCOUNTER — Encounter: Payer: Self-pay | Admitting: Women's Health

## 2015-11-28 ENCOUNTER — Ambulatory Visit (INDEPENDENT_AMBULATORY_CARE_PROVIDER_SITE_OTHER): Payer: BC Managed Care – PPO | Admitting: Women's Health

## 2015-11-28 VITALS — BP 132/78 | Ht 66.0 in | Wt 148.0 lb

## 2015-11-28 DIAGNOSIS — N952 Postmenopausal atrophic vaginitis: Secondary | ICD-10-CM | POA: Diagnosis not present

## 2015-11-28 DIAGNOSIS — Z01419 Encounter for gynecological examination (general) (routine) without abnormal findings: Secondary | ICD-10-CM

## 2015-11-28 DIAGNOSIS — Z13820 Encounter for screening for osteoporosis: Secondary | ICD-10-CM | POA: Diagnosis not present

## 2015-11-28 MED ORDER — ESTRADIOL 10 MCG VA TABS: 1.0000 | ORAL_TABLET | VAGINAL | Status: DC

## 2015-11-28 NOTE — Progress Notes (Signed)
Sharon Medina May 02, 1956 [id number]    History:    Presents for annual exam.  Postmenopausal/on vaginal estrogen per primary care/no bleeding. Normal Pap and mammogram history. Amenorrheic with elevated [place] 2015. 2007 negative colonoscopy. History of depression and hypertension primary care manages.   Past medical history, past surgical history, family history and social history were all reviewed and documented in the EPIC chart. Retired Pharmacist, hospital. Mother, sister, father hypertension.  ROS:  A ROS was performed and pertinent positives and negatives are included.  Exam:  Filed Vitals:   11/28/15 0819  BP: 132/78    General appearance:  Normal Thyroid:  Symmetrical, normal in size, without palpable masses or nodularity. Respiratory  Auscultation:  Clear without wheezing or rhonchi Cardiovascular  Auscultation:  Regular rate, without rubs, murmurs or gallops  Edema/varicosities:  Not grossly evident Abdominal  Soft,nontender, without masses, guarding or rebound.  Liver/spleen:  No organomegaly noted  Hernia:  None appreciated  Skin  Inspection:  Grossly normal   Breasts: Examined lying and sitting.     Right: Without masses, retractions, discharge or axillary adenopathy.     Left: Without masses, retractions, discharge or axillary adenopathy. Gentitourinary   Inguinal/mons:  Normal without inguinal adenopathy  External genitalia:  Normal  BUS/Urethra/Skene's glands:  Normal  Vagina:  Normal  Cervix:  Normal  Uterus:  normal in size, shape and contour.  Midline and mobile  Adnexa/parametria:     Rt: Without masses or tenderness.   Lt: Without masses or tenderness.  Anus and perineum: Normal  Digital rectal exam: Normal sphincter tone without palpated masses or tenderness  Assessment/Plan:  59 y.o. MWF G2 P2 for annual exam with no complaints.  Postmenopausal/no HRT/no bleeding on small amount of Estrace vaginal cream Depression, hypertension-primary care manages labs and  meds  Plan: HRT reviewed will try Vagifem one applicator at bedtime twice weekly, reviewed less systemic absorption. Instructed to call if any further bleeding. SBE's, continue annual screening mammogram 3-D tomography reviewed and encouraged history of dense breasts. Regular exercise, calcium rich diet, vitamin D 1000 daily encouraged. DEXA instructed to schedule. Home safety, fall prevention and importance of weightbearing exercise reviewed, Works with a trainer several times weekly. Pap normal with negative HR HPV 2015, new screening guidelines reviewed. Repeat colonoscopy 2017. Zostavax next year.Marland Kitchen    N[place], 3:01 PM 11/28/2015

## 2015-11-28 NOTE — Patient Instructions (Signed)

## 2015-11-29 LAB — URINALYSIS W MICROSCOPIC + REFLEX CULTURE
BILIRUBIN URINE: NEGATIVE
Bacteria, UA: NONE SEEN [HPF]
CRYSTALS: NONE SEEN [HPF]
Casts: NONE SEEN [LPF]
Glucose, UA: NEGATIVE
Hgb urine dipstick: NEGATIVE
Ketones, ur: NEGATIVE
Leukocytes, UA: NEGATIVE
Nitrite: NEGATIVE
Protein, ur: NEGATIVE
SPECIFIC GRAVITY, URINE: 1.01 (ref 1.001–1.035)
Yeast: NONE SEEN [HPF]
pH: 6.5 (ref 5.0–8.0)

## 2015-11-30 LAB — URINE CULTURE

## 2015-12-30 ENCOUNTER — Other Ambulatory Visit: Payer: Self-pay | Admitting: Women's Health

## 2015-12-30 ENCOUNTER — Ambulatory Visit (INDEPENDENT_AMBULATORY_CARE_PROVIDER_SITE_OTHER): Payer: BC Managed Care – PPO

## 2015-12-30 DIAGNOSIS — M8580 Other specified disorders of bone density and structure, unspecified site: Secondary | ICD-10-CM

## 2015-12-30 DIAGNOSIS — M899 Disorder of bone, unspecified: Secondary | ICD-10-CM

## 2015-12-30 DIAGNOSIS — Z13820 Encounter for screening for osteoporosis: Secondary | ICD-10-CM

## 2015-12-30 DIAGNOSIS — M8589 Other specified disorders of bone density and structure, multiple sites: Secondary | ICD-10-CM

## 2016-01-05 ENCOUNTER — Encounter: Payer: Self-pay | Admitting: Family Medicine

## 2016-01-05 ENCOUNTER — Ambulatory Visit (INDEPENDENT_AMBULATORY_CARE_PROVIDER_SITE_OTHER): Payer: BC Managed Care – PPO | Admitting: Family Medicine

## 2016-01-05 VITALS — BP 110/80 | HR 77 | Temp 98.4°F | Wt 149.0 lb

## 2016-01-05 DIAGNOSIS — F32A Depression, unspecified: Secondary | ICD-10-CM

## 2016-01-05 DIAGNOSIS — F329 Major depressive disorder, single episode, unspecified: Secondary | ICD-10-CM | POA: Diagnosis not present

## 2016-01-05 NOTE — Patient Instructions (Addendum)
[facility] [phone]  I am glad you are doing better. No change in medicine. Counseling is definitely an option and I think a reasonable long term option for maintenance and prevention of worsening of depression.   As always, any thoughts of hurting yourself- call us immediately (doubt this will occur)

## 2016-01-05 NOTE — Progress Notes (Signed)
Garret Reddish, MD  Subjective:  Sharon Medina is a 60 y.o. year old very pleasant female patient who presents for/with See problem oriented charting ROS- No SI/HI. NO chest pain or shortness of breath. Mild anhedonia  Past Medical History-  Patient Active Problem List   Diagnosis Date Noted  . Depression 08/24/2007    Priority: Medium  . Essential hypertension 08/24/2007    Priority: Medium  . History of colonic polyps 06/13/2008    Priority: Low  . Allergic rhinitis 08/24/2007    Priority: Low    Medications- reviewed and updated Current Outpatient Prescriptions  Medication Sig Dispense Refill  . amLODipine (NORVASC) 5 MG tablet take 1 tablet by mouth once daily 90 tablet 3  . atenolol (TENORMIN) 50 MG tablet take 1 tablet by mouth once daily 90 tablet 2  . buPROPion (WELLBUTRIN XL) 150 MG 24 hr tablet Take 1 tablet (150 mg total) by mouth daily. 30 tablet 5  . Estradiol 10 MCG TABS vaginal tablet Place 1 tablet (10 mcg total) vaginally 2 (two) times a week. 8 tablet 12  . Flaxseed, Linseed, (FLAX SEED OIL) 1000 MG CAPS Take 1,000 mg by mouth daily.    Marland Kitchen glucosamine-chondroitin 500-400 MG tablet Take 1 tablet by mouth 2 (two) times daily as needed.      Marland Kitchen losartan (COZAAR) 100 MG tablet take 1 tablet by mouth once daily 90 tablet 2   No current facility-administered medications for this visit.    Objective: BP 110/80 mmHg  Pulse 77  Temp(Src) 98.4 F (36.9 C)  Wt 149 lb (67.586 kg)  LMP 10/02/2014 Gen: NAD, resting comfortably CV: RRR no murmurs rubs or gallops Lungs: CTAB no crackles, wheeze, rhonchi Abdomen: soft/nontender/nondistended/normal bowel sounds.  Ext: no edema Skin: warm, dry, no rash Neuro: grossly normal, moves all extremities  Assessment/Plan:  Depression S: phq9 of 12 down to 3 with new start wellbutrin 150mg . Sleep has been ok and anxiety has not increased. She had been having some globus sensation when anxious and as anxiety has improved this  has abated largely as well. She is pleased with progress. Still with some difficulties including son in process of divorce. Doing better job informing those close of her of things that bother her such as loud music which she could not do before medicine.  A/P: extended counseling. We also discussed in this time that counseling from a therapist or psychologist would likely benefit her as she has seemed to have increasing difficulties as she gets older. She will consider- behavioral health handout given. Continue wellbutrin 150mg  with phq9 of 3 though we considered higher dose- we opted to hold off on change unless things worsen.    Return in about 3 months (around 04/04/2016) for follow up- or sooner if needed.  >50% of 25 minute office visit was spent on counseling (in regards to anxiety and depression) and coordination of care

## 2016-01-05 NOTE — Assessment & Plan Note (Signed)
S: phq9 of 12 down to 3 with new start wellbutrin 150mg . Sleep has been ok and anxiety has not increased. She had been having some globus sensation when anxious and as anxiety has improved this has abated largely as well. She is pleased with progress. Still with some difficulties including son in process of divorce. Doing better job informing those close of her of things that bother her such as loud music which she could not do before medicine.  A/P: extended counseling. We also discussed in this time that counseling from a therapist or psychologist would likely benefit her as she has seemed to have increasing difficulties as she gets older. She will consider- behavioral health handout given. Continue wellbutrin 150mg  with phq9 of 3 though we considered higher dose- we opted to hold off on change unless things worsen.

## 2016-04-06 ENCOUNTER — Encounter: Payer: Self-pay | Admitting: Family Medicine

## 2016-04-06 ENCOUNTER — Ambulatory Visit (INDEPENDENT_AMBULATORY_CARE_PROVIDER_SITE_OTHER): Payer: BC Managed Care – PPO | Admitting: Family Medicine

## 2016-04-06 VITALS — BP 100/70 | HR 63 | Temp 98.8°F | Wt 145.0 lb

## 2016-04-06 DIAGNOSIS — I10 Essential (primary) hypertension: Secondary | ICD-10-CM | POA: Diagnosis not present

## 2016-04-06 DIAGNOSIS — F329 Major depressive disorder, single episode, unspecified: Secondary | ICD-10-CM | POA: Diagnosis not present

## 2016-04-06 DIAGNOSIS — F32A Depression, unspecified: Secondary | ICD-10-CM

## 2016-04-06 MED ORDER — BUPROPION HCL ER (XL) 300 MG PO TB24: 300.0000 mg | ORAL_TABLET | Freq: Every day | ORAL | Status: DC

## 2016-04-06 NOTE — Assessment & Plan Note (Signed)
S: phq9 of 8 this time up from 3. Still on 150mg  wellbutrin. 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 Resistant to idea of meeting with counselor again. She is willing to trial 300mg  dose wellbutrin. She fortunately has not had sleep issues this time though used to at just 150mg  so we may have to titrate back down and use low dose SSRI potentially.

## 2016-04-06 NOTE — Assessment & Plan Note (Signed)
S: controlled. On  Amlodipine 5 mg, atenolol 50 mg, losartan 100 mg. Still gym 3x a week BP Readings from Last 3 Encounters:  04/06/16 100/70  01/05/16 110/80  11/28/15 132/78  A/P:Continue current meds:  But trial 2.5mg  of amlodipine (cut in half) then follow up next visit

## 2016-04-06 NOTE — Patient Instructions (Signed)
Increase wellbutrin to 300mg   Follow up in 6 weeks  Also can cut the amlodipine in half (5mg  currently so would be taking 2.5mg ) and then recheck that at follow up.  BP Readings from Last 3 Encounters:  04/06/16 100/70  01/05/16 110/80  11/28/15 132/78  I think your pressure can tolerate this. There is a 2.5mg  version we can use if this works

## 2016-04-06 NOTE — Progress Notes (Signed)
Garret Reddish, MD  Subjective:  Sharon Medina is a 60 y.o. year old very pleasant female patient who presents for/with See problem oriented charting ROS- No chest pain or shortness of breath. No headache or blurry vision. No SI/HI.   Past Medical History-  Patient Active Problem List   Diagnosis Date Noted  . Depression 08/24/2007    Priority: Medium  . Essential hypertension 08/24/2007    Priority: Medium  . History of colonic polyps 06/13/2008    Priority: Low  . Allergic rhinitis 08/24/2007    Priority: Low    Medications- reviewed and updated Current Outpatient Prescriptions  Medication Sig Dispense Refill  . amLODipine (NORVASC) 5 MG tablet take 1 tablet by mouth once daily 90 tablet 3  . atenolol (TENORMIN) 50 MG tablet take 1 tablet by mouth once daily 90 tablet 2  . buPROPion (WELLBUTRIN XL) 300 MG 24 hr tablet Take 1 tablet (300 mg total) by mouth daily. 30 tablet 5  . Estradiol 10 MCG TABS vaginal tablet Place 1 tablet (10 mcg total) vaginally 2 (two) times a week. 8 tablet 12  . Flaxseed, Linseed, (FLAX SEED OIL) 1000 MG CAPS Take 1,000 mg by mouth daily.    Marland Kitchen glucosamine-chondroitin 500-400 MG tablet Take 1 tablet by mouth 2 (two) times daily as needed.      Marland Kitchen losartan (COZAAR) 100 MG tablet take 1 tablet by mouth once daily 90 tablet 2   No current facility-administered medications for this visit.    Objective: BP 100/70 mmHg  Pulse 63  Temp(Src) 98.8 F (37.1 C)  Wt 145 lb (65.772 kg)  LMP 10/02/2014 Gen: NAD, resting comfortably CV: RRR no murmurs rubs or gallops Lungs: CTAB no crackles, wheeze, rhonchi Abdomen: soft/nontender/nondistended/normal bowel sounds. No rebound or guarding.  Ext: no edema Skin: warm, dry Neuro: grossly normal, moves all extremities, normal gait  Assessment/Plan:  Depression S: phq9 of 8 this time up from 3. Still on 150mg  wellbutrin. Stressors include son finalizing divorce, looking for job hange with more organization  (changin to packing boxes), sister may be moving in with them but have to change homes first (in [employer]/gallery curator) A/P: counseling provided. Resistant to idea of meeting with counselor again. She is willing to trial 300mg  dose wellbutrin. She fortunately has not had sleep issues this time though used to at just 150mg  so we may have to titrate back down and use low dose SSRI potentially.    Essential hypertension S: controlled. On  Amlodipine 5 mg, atenolol 50 mg, losartan 100 mg. Still gym 3x a week BP Readings from Last 3 Encounters:  04/06/16 100/70  01/05/16 110/80  11/28/15 132/78  A/P:Continue current meds:  But trial 2.5mg  of amlodipine (cut in half) then follow up next visit    6-8 weeks.  Return precautions advised.  Meds ordered this encounter  Medications  . buPROPion (WELLBUTRIN XL) 300 MG 24 hr tablet    Sig: Take 1 tablet (300 mg total) by mouth daily.    Dispense:  30 tablet    Refill:  5   The duration of face-to-face time during this visit was 25 minutes. Greater than 50% of this time was spent in counseling, explanation of diagnosis, planning of further management, and/or coordination of care.

## 2016-05-18 ENCOUNTER — Encounter: Payer: Self-pay | Admitting: Family Medicine

## 2016-05-18 ENCOUNTER — Ambulatory Visit (INDEPENDENT_AMBULATORY_CARE_PROVIDER_SITE_OTHER): Payer: BC Managed Care – PPO | Admitting: Family Medicine

## 2016-05-18 VITALS — BP 128/84 | HR 55 | Temp 98.5°F | Wt 145.0 lb

## 2016-05-18 DIAGNOSIS — F329 Major depressive disorder, single episode, unspecified: Secondary | ICD-10-CM

## 2016-05-18 DIAGNOSIS — I10 Essential (primary) hypertension: Secondary | ICD-10-CM | POA: Diagnosis not present

## 2016-05-18 DIAGNOSIS — F32A Depression, unspecified: Secondary | ICD-10-CM

## 2016-05-18 NOTE — Progress Notes (Signed)
Subjective:  Sharon Medina is a 60 y.o. year old very pleasant female patient who presents for/with See problem oriented charting ROS- No SI/HI. No chest pain or shortness of breath. No headache or blurry vision .see any ROS included in HPI as well.   Past Medical History-  Patient Active Problem List   Diagnosis Date Noted  . Depression 08/24/2007    Priority: Medium  . Essential hypertension 08/24/2007    Priority: Medium  . History of colonic polyps 06/13/2008    Priority: Low  . Allergic rhinitis 08/24/2007    Priority: Low    Medications- reviewed and updated Current Outpatient Prescriptions  Medication Sig Dispense Refill  . amLODipine (NORVASC) 5 MG tablet take 1 tablet by mouth once daily 90 tablet 3  . atenolol (TENORMIN) 50 MG tablet take 1 tablet by mouth once daily 90 tablet 2  . buPROPion (WELLBUTRIN XL) 300 MG 24 hr tablet Take 1 tablet (300 mg total) by mouth daily. 30 tablet 5  . CHIA SEED PO Take by mouth. Patient states she grinds chia seed and incorporates into diet.    . Estradiol 10 MCG TABS vaginal tablet Place 1 tablet (10 mcg total) vaginally 2 (two) times a week. 8 tablet 12  . Flaxseed, Linseed, (FLAX SEEDS PO) Take by mouth. Patient states she grinds flax seeds and incorporates into diet.    Marland Kitchen glucosamine-chondroitin 500-400 MG tablet Take 1 tablet by mouth 2 (two) times daily as needed.      Marland Kitchen losartan (COZAAR) 100 MG tablet take 1 tablet by mouth once daily 90 tablet 2   No current facility-administered medications for this visit.    Objective: BP 128/84 mmHg  Pulse 55  Temp(Src) 98.5 F (36.9 C) (Oral)  Wt 145 lb (65.772 kg)  SpO2 97%  LMP 10/02/2014 Gen: NAD, resting comfortably CV: RRR no murmurs rubs or gallops Lungs: CTAB no crackles, wheeze, rhonchi Ext: no edema Skin: warm, dry Neuro: grossly normal, moves all extremities  Assessment/Plan:  Essential hypertension S: controlled on Amlodipine 2.5 mg (1/2 pill- changed to this from  whole at last visit), atenolol 50 mg, losartan 100 mg BP Readings from Last 3 Encounters:  05/18/16 128/84  04/06/16 100/70  01/05/16 110/80  A/P:Continue current meds:  Doing well on reduced dose   Depression S: PHQ9 last visit swung up to 8 from 3 despite continued Wellbutrin 150mg  XL. Resistant to counselor. We titrated up to 300mg .   Stressors- son finalizing divorce, looking for job with more organization, sister moving into in [employer]/gallery curator.  A/P: PHQ9 down to 4 today- she feels she is in a better place. Had meeting with her son yesterday and he is doing better. Sister has not moved in yet- just slowly looking for a space for all of them. [place] with work.    Return in about 6 months (around 11/25/2016) for physical or sooner if you need [place]. Return precautions advised.   Meds ordered this encounter  Medications  . CHIA SEED PO    Sig: Take by mouth. Patient states she grinds chia seed and incorporates into diet.  . Flaxseed, Linseed, (FLAX SEEDS PO)    Sig: Take by mouth. Patient states she grinds flax seeds and incorporates into diet.    Garret Reddish, MD

## 2016-05-18 NOTE — Patient Instructions (Signed)
Overall things look much better!  Blood pressure controlled on lower dose  Depression in better place- you know the warning signs so see me back sooner if things do not continue to go in the right direction for you  With physical labs- ask them to add if you want  Health Maintenance Due  Topic Date Due  . Hepatitis C Screening  06-01-1956  . HIV Screening  05/28/1971

## 2016-05-18 NOTE — Assessment & Plan Note (Signed)
S: controlled on Amlodipine 2.5 mg (1/2 pill- changed to this from whole at last visit), atenolol 50 mg, losartan 100 mg BP Readings from Last 3 Encounters:  05/18/16 128/84  04/06/16 100/70  01/05/16 110/80  A/P:Continue current meds:  Doing well on reduced dose

## 2016-05-18 NOTE — Assessment & Plan Note (Signed)
S: PHQ9 last visit swung up to 8 from 3 despite continued Wellbutrin 150mg  XL. Resistant to counselor. We titrated up to 300mg .   Stressors- son finalizing divorce, looking for job with more organization, sister moving into in Museum/gallery curator.  A/P: PHQ9 down to 4 today- she feels she is in a better place. Had meeting with her son yesterday and he is doing better. Sister has not moved in yet- just slowly looking for a space for all of them. [place] with work.

## 2016-05-19 ENCOUNTER — Encounter: Payer: Self-pay | Admitting: Gastroenterology

## 2016-05-20 ENCOUNTER — Encounter: Payer: Self-pay | Admitting: Internal Medicine

## 2016-06-29 ENCOUNTER — Ambulatory Visit (AMBULATORY_SURGERY_CENTER): Payer: Self-pay

## 2016-06-29 VITALS — Ht 66.0 in | Wt 142.8 lb

## 2016-06-29 DIAGNOSIS — Z86010 Personal history of colonic polyps: Secondary | ICD-10-CM

## 2016-06-29 DIAGNOSIS — Z800 Family history of malignant neoplasm of digestive organs: Secondary | ICD-10-CM

## 2016-06-29 MED ORDER — NA SULFATE-K SULFATE-MG SULF 17.5-3.13-1.6 GM/177ML PO SOLN: ORAL | Status: DC

## 2016-06-29 NOTE — Progress Notes (Signed)
Per pt, no allergies to soy or egg products.Pt not taking any weight loss meds or using  O2 at home. 

## 2016-07-06 ENCOUNTER — Encounter: Payer: Self-pay | Admitting: Gastroenterology

## 2016-07-20 ENCOUNTER — Encounter: Payer: Self-pay | Admitting: Gastroenterology

## 2016-07-20 ENCOUNTER — Ambulatory Visit (AMBULATORY_SURGERY_CENTER): Payer: BC Managed Care – PPO | Admitting: Gastroenterology

## 2016-07-20 VITALS — BP 112/76 | HR 55 | Temp 98.2°F | Resp 12 | Ht 66.0 in | Wt 142.0 lb

## 2016-07-20 DIAGNOSIS — Z1212 Encounter for screening for malignant neoplasm of rectum: Secondary | ICD-10-CM

## 2016-07-20 DIAGNOSIS — D122 Benign neoplasm of ascending colon: Secondary | ICD-10-CM

## 2016-07-20 DIAGNOSIS — Z1211 Encounter for screening for malignant neoplasm of colon: Secondary | ICD-10-CM

## 2016-07-20 DIAGNOSIS — D120 Benign neoplasm of cecum: Secondary | ICD-10-CM

## 2016-07-20 DIAGNOSIS — D123 Benign neoplasm of transverse colon: Secondary | ICD-10-CM

## 2016-07-20 MED ORDER — SODIUM CHLORIDE 0.9 % IV SOLN: 500.0000 mL | INTRAVENOUS | Status: DC

## 2016-07-20 NOTE — Patient Instructions (Signed)
YOU HAD AN ENDOSCOPIC PROCEDURE TODAY AT [facility]:   Refer to the procedure report that was given to you for any specific questions about what was found during the examination.  If the procedure report does not answer your questions, please call your gastroenterologist to clarify.  If you requested that your care partner not be given the details of your procedure findings, then the procedure report has been included in a sealed envelope for you to review at your convenience later.  YOU SHOULD EXPECT: Some feelings of bloating in the abdomen. Passage of more gas than usual.  Walking can help get rid of the air that was put into your GI tract during the procedure and reduce the bloating. If you had a lower endoscopy (such as a colonoscopy or flexible sigmoidoscopy) you may notice spotting of blood in your stool or on the toilet paper. If you underwent a bowel prep for your procedure, you may not have a normal bowel movement for a few days.  Please Note:  You might notice some irritation and congestion in your nose or some drainage.  This is from the oxygen used during your procedure.  There is no need for concern and it should clear up in a day or so.  SYMPTOMS TO REPORT IMMEDIATELY:   Following lower endoscopy (colonoscopy or flexible sigmoidoscopy):  Excessive amounts of blood in the stool  Significant tenderness or worsening of abdominal pains  Swelling of the abdomen that is new, acute  Fever of 100F or higher   For urgent or emergent issues, a gastroenterologist can be reached at any hour by calling [phone].   DIET: Your first meal following the procedure should be a small meal and then it is ok to progress to your normal diet. Heavy or fried foods are harder to digest and may make you feel nauseous or bloated.  Likewise, meals heavy in dairy and vegetables can increase bloating.  Drink plenty of fluids but you should avoid alcoholic beverages for 24  hours.  ACTIVITY:  You should plan to take it easy for the rest of today and you should NOT DRIVE or use heavy machinery until tomorrow (because of the sedation medicines used during the test).    FOLLOW UP: Our staff will call the number listed on your records the next business day following your procedure to check on you and address any questions or concerns that you may have regarding the information given to you following your procedure. If we do not reach you, we will leave a message.  However, if you are feeling well and you are not experiencing any problems, there is no need to return our call.  We will assume that you have returned to your regular daily activities without incident.  If any biopsies were taken you will be contacted by phone or by letter within the next 1-3 weeks.  Please call us at [phone] if you have not heard about the biopsies in 3 weeks.    SIGNATURES/CONFIDENTIALITY: You and/or your care partner have signed paperwork which will be entered into your electronic medical record.  These signatures attest to the fact that that the information above on your After Visit Summary has been reviewed and is understood.  Full responsibility of the confidentiality of this discharge information lies with you and/or your care-partner.   Continue present medications. High fiber diet, polyps, diverticulosis handouts given.

## 2016-07-20 NOTE — Progress Notes (Signed)
Called to room to assist during endoscopic procedure.  Patient ID and intended procedure confirmed with present staff. Received instructions for my participation in the procedure from the performing physician.  

## 2016-07-20 NOTE — Op Note (Signed)
[facility] Patient Name: Sharon Medina Procedure Date: 07/20/2016 8:51 AM MRN: [id number] Endoscopist: Ladene Artist , MD Age: 60 Referring MD:  Date of Birth: 05-22-1956 Gender: Female Account #: [id number] Procedure:                Colonoscopy Indications:              Screening for colorectal malignant neoplasm Medicines:                Monitored Anesthesia Care Procedure:                Pre-Anesthesia Assessment:                           - Prior to the procedure, a History and Physical                            was performed, and patient medications and                            allergies were reviewed. The patient's tolerance of                            previous anesthesia was also reviewed. The risks                            and benefits of the procedure and the sedation                            options and risks were discussed with the patient.                            All questions were answered, and informed consent                            was obtained. Prior Anticoagulants: The patient has                            taken no previous anticoagulant or antiplatelet                            agents. ASA Grade Assessment: II - A patient with                            mild systemic disease. After reviewing the risks                            and benefits, the patient was deemed in                            satisfactory condition to undergo the procedure.                           After obtaining informed consent, the colonoscope  was passed under direct vision. Throughout the                            procedure, the patient's blood pressure, pulse, and                            oxygen saturations were monitored continuously. The                            Model PCF-H190DL [id number]) scope was introduced                            through the anus and advanced to the the cecum,                            identified by  appendiceal orifice and ileocecal                            valve. The ileocecal valve, appendiceal orifice,                            and rectum were photographed. The quality of the                            bowel preparation was excellent. The colonoscopy                            was performed without difficulty. The patient                            tolerated the procedure well. Scope In: 8:58:37 AM Scope Out: 9:13:48 AM Scope Withdrawal Time: 0 hours 11 minutes 22 seconds  Total Procedure Duration: 0 hours 15 minutes 11 seconds  Findings:                 A 8 mm polyp was found in the ascending colon. The                            polyp was semi-pedunculated. The polyp was removed                            with a cold snare. Resection and retrieval were                            complete.                           Two sessile polyps were found in the transverse                            colon and cecum. The polyps were 6 mm in size.                            These polyps were removed with a cold  snare.                            Resection and retrieval were complete.                           Internal hemorrhoids were found during                            retroflexion. The hemorrhoids were small and Grade                            I (internal hemorrhoids that do not prolapse).                           The exam was otherwise without abnormality on                            direct and retroflexion views.                           Scattered medium-mouthed diverticula were found in                            the sigmoid colon, descending colon, transverse                            colon and ascending colon. There was no evidence of                            diverticular bleeding. Complications:            No immediate complications. Estimated blood loss:                            None. Estimated Blood Loss:     Estimated blood loss: none. Impression:               -  One 8 mm polyp in the ascending colon, removed                            with a cold snare. Resected and retrieved.                           - Two 6 mm polyps in the transverse colon and in                            the cecum, removed with a cold snare. Resected and                            retrieved.                           - Mild diverticulosis in the sigmoid colon, in the  descending colon, in the transverse colon and in                            the ascending colon. There was no evidence of                            diverticular bleeding. Recommendation:           - Repeat colonoscopy in 5 years for surveillance if                            polyp(s) precancerous, otherwise 10 years for                            screening.                           - Patient has a contact number available for                            emergencies. The signs and symptoms of potential                            delayed complications were discussed with the                            patient. Return to normal activities tomorrow.                            Written discharge instructions were provided to the                            patient.                           - High fiber diet.                           - Continue present medications.                           - Await pathology results. Ladene Artist, MD 07/20/2016 9:21:32 AM This report has been signed electronically.

## 2016-07-20 NOTE — Progress Notes (Signed)
A/ox3 pleased with MAC, report to [facility]

## 2016-07-21 ENCOUNTER — Telehealth: Payer: Self-pay | Admitting: *Deleted

## 2016-07-21 NOTE — Telephone Encounter (Signed)
Opened in error

## 2016-07-21 NOTE — Telephone Encounter (Signed)
  Follow up Call-  Call back number 07/20/2016  Post procedure Call Back phone  # [phone]  Permission to leave phone message Yes  Some recent data might be hidden     Patient questions:  Do you have a fever, pain , or abdominal swelling? No. Pain Score  0 *  Have you tolerated food without any problems? Yes.    Have you been able to return to your normal activities? Yes.    Do you have any questions about your discharge instructions: Diet   No. Medications  No. Follow up visit  No.  Do you have questions or concerns about your Care? No.  Actions: * If pain score is 4 or above: No action needed, pain <4.

## 2016-07-30 ENCOUNTER — Telehealth: Payer: Self-pay | Admitting: Gastroenterology

## 2016-07-30 NOTE — Telephone Encounter (Signed)
The pt has been notified that the path has not been reviewed by Fuller Plan, he is out of the office until Monday.  We will call her with recommendations after Dr Fuller Plan reviews on Monday

## 2016-08-02 ENCOUNTER — Encounter: Payer: Self-pay | Admitting: Gastroenterology

## 2016-08-14 ENCOUNTER — Other Ambulatory Visit: Payer: Self-pay | Admitting: Family Medicine

## 2016-09-17 ENCOUNTER — Ambulatory Visit (INDEPENDENT_AMBULATORY_CARE_PROVIDER_SITE_OTHER): Payer: BC Managed Care – PPO | Admitting: *Deleted

## 2016-09-17 DIAGNOSIS — Z23 Encounter for immunization: Secondary | ICD-10-CM | POA: Diagnosis not present

## 2016-10-07 ENCOUNTER — Other Ambulatory Visit: Payer: Self-pay | Admitting: Family Medicine

## 2016-10-07 DIAGNOSIS — Z1231 Encounter for screening mammogram for malignant neoplasm of breast: Secondary | ICD-10-CM

## 2016-11-08 ENCOUNTER — Ambulatory Visit
Admission: RE | Admit: 2016-11-08 | Discharge: 2016-11-08 | Disposition: A | Discharge status: Home / Self Care | Payer: BC Managed Care – PPO | Source: Ambulatory Visit | Attending: Family Medicine | Admitting: Family Medicine

## 2016-11-08 DIAGNOSIS — Z1231 Encounter for screening mammogram for malignant neoplasm of breast: Secondary | ICD-10-CM

## 2016-11-15 ENCOUNTER — Other Ambulatory Visit: Payer: Self-pay | Admitting: Family Medicine

## 2016-11-23 ENCOUNTER — Other Ambulatory Visit (INDEPENDENT_AMBULATORY_CARE_PROVIDER_SITE_OTHER): Payer: BC Managed Care – PPO

## 2016-11-23 DIAGNOSIS — Z0000 Encounter for general adult medical examination without abnormal findings: Secondary | ICD-10-CM

## 2016-11-23 LAB — LIPID PANEL
CHOL/HDL RATIO: 2
CHOLESTEROL: 189 mg/dL (ref 0–200)
HDL: 80.2 mg/dL (ref >39.00)
LDL CALC: 97 mg/dL (ref 0–99)
NonHDL: 108.93
TRIGLYCERIDES: 58 mg/dL (ref 0.0–149.0)
VLDL: 11.6 mg/dL (ref 0.0–40.0)

## 2016-11-23 LAB — CBC WITH DIFFERENTIAL/PLATELET
BASOS PCT: 1 % (ref 0.0–3.0)
Basophils Absolute: 0 10*3/uL (ref 0.0–0.1)
EOS PCT: 4.6 % (ref 0.0–5.0)
Eosinophils Absolute: 0.2 10*3/uL (ref 0.0–0.7)
HEMATOCRIT: 38.2 % (ref 36.0–46.0)
HEMOGLOBIN: 12.9 g/dL (ref 12.0–15.0)
LYMPHS PCT: 31.7 % (ref 12.0–46.0)
Lymphs Abs: 1.6 10*3/uL (ref 0.7–4.0)
MCHC: 33.9 g/dL (ref 30.0–36.0)
MCV: 88.4 fl (ref 78.0–100.0)
Monocytes Absolute: 0.4 10*3/uL (ref 0.1–1.0)
Monocytes Relative: 7.3 % (ref 3.0–12.0)
Neutro Abs: 2.7 10*3/uL (ref 1.4–7.7)
Neutrophils Relative %: 55.4 % (ref 43.0–77.0)
Platelets: 234 10*3/uL (ref 150.0–400.0)
RBC: 4.32 Mil/uL (ref 3.87–5.11)
RDW: 13.8 % (ref 11.5–15.5)
WBC: 4.9 10*3/uL (ref 4.0–10.5)

## 2016-11-23 LAB — POC URINALSYSI DIPSTICK (AUTOMATED)
Bilirubin, UA: NEGATIVE
Blood, UA: NEGATIVE
Glucose, UA: NEGATIVE
KETONES UA: NEGATIVE
Leukocytes, UA: NEGATIVE
Nitrite, UA: NEGATIVE
PH UA: 7.5
PROTEIN UA: NEGATIVE
SPEC GRAV UA: 1.015
UROBILINOGEN UA: 0.2

## 2016-11-23 LAB — BASIC METABOLIC PANEL
BUN: 20 mg/dL (ref 6–23)
CHLORIDE: 104 meq/L (ref 96–112)
CO2: 29 mEq/L (ref 19–32)
Calcium: 9.3 mg/dL (ref 8.4–10.5)
Creatinine, Ser: 0.82 mg/dL (ref 0.40–1.20)
GFR: 75.46 mL/min (ref >60.00)
Glucose, Bld: 86 mg/dL (ref 70–99)
POTASSIUM: 4.7 meq/L (ref 3.5–5.1)
SODIUM: 140 meq/L (ref 135–145)

## 2016-11-23 LAB — HEPATIC FUNCTION PANEL
ALT: 16 U/L (ref 0–35)
AST: 19 U/L (ref 0–37)
Albumin: 4.2 g/dL (ref 3.5–5.2)
Alkaline Phosphatase: 47 U/L (ref 39–117)
BILIRUBIN DIRECT: 0.1 mg/dL (ref 0.0–0.3)
BILIRUBIN TOTAL: 0.4 mg/dL (ref 0.2–1.2)
TOTAL PROTEIN: 6.3 g/dL (ref 6.0–8.3)

## 2016-11-23 LAB — TSH: TSH: 1.55 u[IU]/mL (ref 0.35–4.50)

## 2016-11-30 ENCOUNTER — Ambulatory Visit (INDEPENDENT_AMBULATORY_CARE_PROVIDER_SITE_OTHER): Payer: BC Managed Care – PPO | Admitting: Family Medicine

## 2016-11-30 ENCOUNTER — Ambulatory Visit (INDEPENDENT_AMBULATORY_CARE_PROVIDER_SITE_OTHER): Payer: BC Managed Care – PPO | Admitting: Women's Health

## 2016-11-30 ENCOUNTER — Encounter: Payer: Self-pay | Admitting: Women's Health

## 2016-11-30 ENCOUNTER — Encounter: Payer: Self-pay | Admitting: Family Medicine

## 2016-11-30 VITALS — BP 120/80 | HR 63 | Temp 98.1°F | Ht 65.5 in | Wt 143.4 lb

## 2016-11-30 VITALS — BP 120/78 | Ht 66.0 in | Wt 143.0 lb

## 2016-11-30 DIAGNOSIS — Z0000 Encounter for general adult medical examination without abnormal findings: Secondary | ICD-10-CM

## 2016-11-30 DIAGNOSIS — N952 Postmenopausal atrophic vaginitis: Secondary | ICD-10-CM

## 2016-11-30 DIAGNOSIS — Z01419 Encounter for gynecological examination (general) (routine) without abnormal findings: Secondary | ICD-10-CM

## 2016-11-30 MED ORDER — ESTRADIOL 10 MCG VA TABS: 1.0000 | ORAL_TABLET | VAGINAL | 12 refills | Status: DC

## 2016-11-30 MED ORDER — BUPROPION HCL ER (XL) 150 MG PO TB24: 300.0000 mg | ORAL_TABLET | Freq: Every day | ORAL | 3 refills | Status: DC

## 2016-11-30 NOTE — Progress Notes (Signed)
Phone: [phone]  Subjective:  Patient presents today for their annual physical. Chief complaint-noted.   See problem oriented charting- ROS- full  review of systems was completed and negative except for: difficulty sleeping on wellbutrin 300mg   The following were reviewed and entered/updated in epic: Past Medical History:  Diagnosis Date  . Claustrophobia    severe  . Depression   . H/O seasonal allergies   . Hemorrhoids   . Hypertension   . Postmenopausal HRT (hormone replacement therapy)    per pt, never took oral HR meds   Patient Active Problem List   Diagnosis Date Noted  . Depression 08/24/2007    Priority: Medium  . Essential hypertension 08/24/2007    Priority: Medium  . History of colonic polyps 06/13/2008    Priority: Low  . Allergic rhinitis 08/24/2007    Priority: Low   Past Surgical History:  Procedure Laterality Date  . CESAREAN SECTION     x2  . COLONOSCOPY    . WISDOM TOOTH EXTRACTION      Family History  Problem Relation Age of Onset  . Prostate cancer Father     smoker  . Hypertension Father   . Ulcers Father   . Hypertension Mother     sister as well  . Colon cancer Paternal Aunt   . Gout Brother   . Heart disease Brother   . Gout Sister   . Hypertension Sister   . Colon cancer Paternal Aunt   . Gout Sister     brother    Medications- reviewed and updated Current Outpatient Prescriptions  Medication Sig Dispense Refill  . amLODipine (NORVASC) 5 MG tablet take 1 tablet by mouth once daily 90 tablet 3  . atenolol (TENORMIN) 50 MG tablet take 1 tablet by mouth once daily 90 tablet 2  . buPROPion (WELLBUTRIN XL) 150 MG 24 hr tablet Take 2 tablets (300 mg total) by mouth daily. 90 tablet 3  . CHIA SEED PO Take by mouth. Patient states she grinds chia seed and incorporates into diet.    . Estradiol 10 MCG TABS vaginal tablet Place 1 tablet (10 mcg total) vaginally 2 (two) times a week. 8 tablet 12  . fexofenadine-pseudoephedrine  (ALLEGRA-D 24) 180-240 MG 24 hr tablet Take 1 tablet by mouth as needed.    . Flaxseed, Linseed, (FLAX SEEDS PO) Take by mouth. Patient states she grinds flax seeds and incorporates into diet.    Marland Kitchen glucosamine-chondroitin 500-400 MG tablet Take 1 tablet by mouth 2 (two) times daily as needed.      Marland Kitchen losartan (COZAAR) 100 MG tablet take 1 tablet by mouth once daily 90 tablet 2   Current Facility-Administered Medications  Medication Dose Route Frequency Provider Last Rate Last Dose  . 0.9 %  sodium chloride infusion  500 mL Intravenous Continuous Ladene Artist, MD        Allergies-reviewed and updated Allergies  Allergen Reactions  . Hydrochlorothiazide     REACTION: rash    Social History   Social History  . Marital status: Married    Spouse name: N/A  . Number of children: N/A  . Years of education: N/A   Social History Main Topics  . Smoking status: Never Smoker  . Smokeless tobacco: Never Used  . Alcohol use 4.2 oz/week    7 Standard drinks or equivalent per week  . Drug use: No  . Sexual activity: Yes   Other Topics Concern  . None   Social History Narrative  Married. 2 living children, 1 died at birth. 2 granddaughter.    Lives with husband. 0 pets.       Former teacher-retired.    Working part time with [employer]- [employer] etc. Stressful boss.       Hobbies: rummy cube (tile game), antiquing, cooking, gardening    Objective: BP 120/80 (BP Location: Left Arm, Patient Position: Sitting, Cuff Size: Normal)   Pulse 63   Temp 98.1 F (36.7 C) (Oral)   Ht 5' 5.5" (1.664 m)   Wt 143 lb 6.4 oz (65 kg)   LMP 10/02/2014   SpO2 98%   BMI 23.50 kg/m  Gen: NAD, resting comfortably HEENT: Mucous membranes are moist. Oropharynx normal. TM normal.  Neck: no thyromegaly CV: RRR no murmurs rubs or gallops Lungs: CTAB no crackles, wheeze, rhonchi Abdomen: soft/nontender/nondistended/normal bowel sounds. No rebound or guarding. Healthy weight.  Ext: no edema,  2+ pulses Skin: warm, dry Neuro: grossly normal, moves all extremities, PERRLA  Assessment/Plan:  60 y.o. female presenting for annual physical.  Health Maintenance counseling: 1. Anticipatory guidance: Patient counseled regarding regular dental exams, eye exams, wearing seatbelts.  2. Risk factor reduction:  Advised patient of need for regular exercise (generally does pretty well)  and diet rich and fruits and vegetables to reduce risk of heart attack and stroke. Diet reasonable- weight maintenance  3. Immunizations/screenings/ancillary studies Immunization History  Administered Date(s) Administered  . Influenza Split 09/10/2011, 09/18/2012  . Influenza Whole 09/07/2010  . Influenza,inj,Quad PF,36+ Mos 09/24/2013, 09/25/2014, 10/16/2015, 09/17/2016  . PPD Test 04/10/2012  . Td 12/20/2000  . Tdap 09/18/2012   Health Maintenance Due  Topic Date Due  . Hepatitis C Screening - next blood draw 07-18-1956  . HIV Screening - next blood draw 05/28/1971  . ZOSTAVAX - postpone due to potential for shingrix 05/27/2016   4. Cervical cancer screening- 11/26/14 with 3 year repeat with GYN, also sees GYN this afternoon 5. Breast cancer screening-  breast exam with GYN and mammogram 11/08/16 6. Colon cancer screening - 07/20/16 done with 5 year repeat with adenoma 7. Skin cancer prevention- really good about sunscreen use  Status of chronic or acute concerns   HTN- controlled on amlodipine 2.5mg , atenolol 50mg , losartan 100mg . Lipids now with LDL <100- no statin planned.   Depression- reasonably controlled on wellbutrin 300mg  but could not sleep well. phq2 of 0. Will change back to 150mg  form. With some periods of stress she ends up needing more medication so will leave at 300mg   Estradiol vaginal tablets through GYN  Meds ordered this encounter  Medications  . buPROPion (WELLBUTRIN XL) 150 MG 24 hr tablet    Sig: Take 2 tablets (300 mg total) by mouth daily.    Dispense:  90 tablet     Refill:  3    Return precautions advised.   Garret Reddish, MD

## 2016-11-30 NOTE — Progress Notes (Signed)
Pre visit review using our clinic review tool, if applicable. No additional management support is needed unless otherwise documented below in the visit note. 

## 2016-11-30 NOTE — Progress Notes (Signed)
Sharon Medina [facility] 1956/02/11 [id number]    History:    Presents for annual exam.  Postmenopausal  with no bleeding. Uses Vagifem twice weekly with good relief of dryness.  Normal Pap and mammogram history. 12/2015 T score -1.5 femoral neck FRAX 7.5%/0.6%. Hypertension and depression managed by primary care. 07/2016 Tubular adenoma 5 year follow-up.  Past medical history, past surgical history, family history and social history were all reviewed and documented in the EPIC chart. Retired Pharmacist, hospital working part time. Mother, sister, father hypertension.  ROS:  A ROS was performed and pertinent positives and negatives are included.  Exam:  Vitals:   11/30/16 1203  BP: 120/78  Weight: 143 lb (64.9 kg)  Height: 5\' 6"  (1.676 m)   Body mass index is 23.08 kg/m.   General appearance:  Normal Thyroid:  Symmetrical, normal in size, without palpable masses or nodularity. Respiratory  Auscultation:  Clear without wheezing or rhonchi Cardiovascular  Auscultation:  Regular rate, without rubs, murmurs or gallops  Edema/varicosities:  Not grossly evident Abdominal  Soft,nontender, without masses, guarding or rebound.  Liver/spleen:  No organomegaly noted  Hernia:  None appreciated  Skin  Inspection:  Grossly normal   Breasts: Examined lying and sitting.     Right: Without masses, retractions, discharge or axillary adenopathy.     Left: Without masses, retractions, discharge or axillary adenopathy. Gentitourinary   Inguinal/mons:  Normal without inguinal adenopathy  External genitalia:  Normal  BUS/Urethra/Skene's glands:  Normal  Vagina:  Normal  Cervix:  Normal  Uterus:   normal in size, shape and contour.  Midline and mobile  Adnexa/parametria:     Rt: Without masses or tenderness.   Lt: Without masses or tenderness.  Anus and perineum: Normal  Digital rectal exam: Normal sphincter tone without palpated masses or tenderness  Assessment/Plan:  60 y.o. MWF G3 P3 for annual exam with no  complaints.  Postmenopausal on Vagifem with no bleeding 07/2016 colonoscopy- Tubular adenoma Hypertension and depression managed by primary care labs and meds Osteopenia without elevated FRAX  Plan: Vagifem prescription, proper use, will continue to use twice weekly, reviewed minimal systemic absorption. SBE's, continue annual 3-D screening mammogram history of dense breasts. Continue active lifestyle of regular exercise, calcium rich diet, vitamin D 1000 daily encouraged. UA, Pap normal with negative HR HPV 2015, new screening guidelines reviewed. Zostavax recommended.    Huel Cote WHNP, 1:04 PM 11/30/2016

## 2016-11-30 NOTE — Patient Instructions (Signed)

## 2016-11-30 NOTE — Patient Instructions (Signed)
wellbutrin 300mg  but could not sleep well. . Will change back to 150mg  form of medicine. With some periods of stress she ends up needing more medication so will leave at 300mg   Great job taking care of yourself.

## 2016-12-01 LAB — URINALYSIS W MICROSCOPIC + REFLEX CULTURE
Bacteria, UA: NONE SEEN [HPF]
Bilirubin Urine: NEGATIVE
CASTS: NONE SEEN [LPF]
CRYSTALS: NONE SEEN [HPF]
Glucose, UA: NEGATIVE
HGB URINE DIPSTICK: NEGATIVE
KETONES UR: NEGATIVE
Nitrite: NEGATIVE
PROTEIN: NEGATIVE
RBC / HPF: NONE SEEN RBC/HPF (ref <=2)
SPECIFIC GRAVITY, URINE: 1.011 (ref 1.001–1.035)
Yeast: NONE SEEN [HPF]
pH: 6 (ref 5.0–8.0)

## 2016-12-02 ENCOUNTER — Encounter: Payer: Self-pay | Admitting: Women's Health

## 2016-12-02 LAB — URINE CULTURE

## 2017-02-16 ENCOUNTER — Other Ambulatory Visit: Payer: Self-pay | Admitting: Family Medicine

## 2017-05-04 ENCOUNTER — Encounter: Payer: Self-pay | Admitting: Gynecology

## 2017-10-24 ENCOUNTER — Other Ambulatory Visit: Payer: Self-pay | Admitting: Family Medicine

## 2017-10-24 DIAGNOSIS — Z1231 Encounter for screening mammogram for malignant neoplasm of breast: Secondary | ICD-10-CM

## 2017-11-16 ENCOUNTER — Other Ambulatory Visit: Payer: Self-pay | Admitting: *Deleted

## 2017-11-16 MED ORDER — AMLODIPINE BESYLATE 5 MG PO TABS: 5.0000 mg | ORAL_TABLET | Freq: Every day | ORAL | 3 refills | Status: DC

## 2017-11-25 ENCOUNTER — Other Ambulatory Visit: Payer: BC Managed Care – PPO

## 2017-12-02 ENCOUNTER — Ambulatory Visit (INDEPENDENT_AMBULATORY_CARE_PROVIDER_SITE_OTHER): Payer: BC Managed Care – PPO | Admitting: Family Medicine

## 2017-12-02 ENCOUNTER — Encounter: Payer: Self-pay | Admitting: Family Medicine

## 2017-12-02 VITALS — BP 136/82 | HR 69 | Temp 99.0°F | Ht 66.5 in | Wt 140.4 lb

## 2017-12-02 DIAGNOSIS — I10 Essential (primary) hypertension: Secondary | ICD-10-CM

## 2017-12-02 DIAGNOSIS — F3341 Major depressive disorder, recurrent, in partial remission: Secondary | ICD-10-CM

## 2017-12-02 DIAGNOSIS — F411 Generalized anxiety disorder: Secondary | ICD-10-CM | POA: Insufficient documentation

## 2017-12-02 DIAGNOSIS — Z1159 Encounter for screening for other viral diseases: Secondary | ICD-10-CM

## 2017-12-02 DIAGNOSIS — Z114 Encounter for screening for human immunodeficiency virus [HIV]: Secondary | ICD-10-CM

## 2017-12-02 DIAGNOSIS — Z0000 Encounter for general adult medical examination without abnormal findings: Secondary | ICD-10-CM | POA: Diagnosis not present

## 2017-12-02 DIAGNOSIS — M8580 Other specified disorders of bone density and structure, unspecified site: Secondary | ICD-10-CM

## 2017-12-02 LAB — COMPREHENSIVE METABOLIC PANEL
ALT: 15 U/L (ref 0–35)
AST: 19 U/L (ref 0–37)
Albumin: 4.4 g/dL (ref 3.5–5.2)
Alkaline Phosphatase: 47 U/L (ref 39–117)
BILIRUBIN TOTAL: 0.5 mg/dL (ref 0.2–1.2)
BUN: 14 mg/dL (ref 6–23)
CHLORIDE: 101 meq/L (ref 96–112)
CO2: 31 meq/L (ref 19–32)
Calcium: 9.6 mg/dL (ref 8.4–10.5)
Creatinine, Ser: 0.86 mg/dL (ref 0.40–1.20)
GFR: 71.18 mL/min (ref >60.00)
GLUCOSE: 88 mg/dL (ref 70–99)
Potassium: 4.3 mEq/L (ref 3.5–5.1)
Sodium: 138 mEq/L (ref 135–145)
Total Protein: 6.8 g/dL (ref 6.0–8.3)

## 2017-12-02 LAB — LIPID PANEL
CHOL/HDL RATIO: 2
Cholesterol: 196 mg/dL (ref 0–200)
HDL: 84.5 mg/dL (ref >39.00)
LDL CALC: 99 mg/dL (ref 0–99)
NONHDL: 111.3
TRIGLYCERIDES: 64 mg/dL (ref 0.0–149.0)
VLDL: 12.8 mg/dL (ref 0.0–40.0)

## 2017-12-02 LAB — CBC
HCT: 41.8 % (ref 36.0–46.0)
HEMOGLOBIN: 13.9 g/dL (ref 12.0–15.0)
MCHC: 33.2 g/dL (ref 30.0–36.0)
MCV: 91.4 fl (ref 78.0–100.0)
Platelets: 288 10*3/uL (ref 150.0–400.0)
RBC: 4.57 Mil/uL (ref 3.87–5.11)
RDW: 13.4 % (ref 11.5–15.5)
WBC: 5.9 10*3/uL (ref 4.0–10.5)

## 2017-12-02 MED ORDER — SERTRALINE HCL 25 MG PO TABS: 25.0000 mg | ORAL_TABLET | Freq: Every day | ORAL | 5 refills | Status: DC

## 2017-12-02 NOTE — Progress Notes (Signed)
Phone: [phone]  Subjective:  Patient presents today for their annual physical. Chief complaint-noted.   See problem oriented charting- ROS- full  review of systems was completed and negative except for: back pain, joint pain, anxiety, sad mood at times, seasonal allergies, no SI   The following were reviewed and entered/updated in epic: Past Medical History:  Diagnosis Date  . Claustrophobia    severe  . Depression   . H/O seasonal allergies   . Hemorrhoids   . Hypertension   . Postmenopausal HRT (hormone replacement therapy)    per pt, never took oral HR meds   Patient Active Problem List   Diagnosis Date Noted  . Osteopenia 12/02/2017    Priority: Medium  . GAD (generalized anxiety disorder) 12/02/2017    Priority: Medium  . Depression 08/24/2007    Priority: Medium  . Essential hypertension 08/24/2007    Priority: Medium  . History of colonic polyps 06/13/2008    Priority: Low  . Allergic rhinitis 08/24/2007    Priority: Low   Past Surgical History:  Procedure Laterality Date  . CESAREAN SECTION     x2  . COLONOSCOPY    . WISDOM TOOTH EXTRACTION      Family History  Problem Relation Age of Onset  . Prostate cancer Father        smoker  . Hypertension Father   . Ulcers Father   . Hypertension Mother        sister as well  . Colon cancer Paternal Aunt   . Gout Brother   . Heart disease Brother   . Gout Sister   . Hypertension Sister   . Colon cancer Paternal Aunt   . Gout Sister        brother    Medications- reviewed and updated Current Outpatient Medications  Medication Sig Dispense Refill  . amLODipine (NORVASC) 5 MG tablet Take 1 tablet (5 mg total) by mouth daily. 90 tablet 3  . atenolol (TENORMIN) 50 MG tablet take 1 tablet by mouth once daily 90 tablet 2  . buPROPion (WELLBUTRIN XL) 150 MG 24 hr tablet Take 2 tablets (300 mg total) by mouth daily. 180 tablet 3  . CHIA SEED PO Take by mouth. Patient states she grinds chia seed and  incorporates into diet.    . Estradiol 10 MCG TABS vaginal tablet Place 1 tablet (10 mcg total) vaginally 2 (two) times a week. 8 tablet 12  . fexofenadine-pseudoephedrine (ALLEGRA-D 24) 180-240 MG 24 hr tablet Take 1 tablet by mouth as needed.    . Flaxseed, Linseed, (FLAX SEEDS PO) Take by mouth. Patient states she grinds flax seeds and incorporates into diet.    Marland Kitchen glucosamine-chondroitin 500-400 MG tablet Take 1 tablet by mouth 2 (two) times daily as needed.      Marland Kitchen losartan (COZAAR) 100 MG tablet take 1 tablet by mouth once daily 90 tablet 2   No current facility-administered medications for this visit.     Allergies-reviewed and updated Allergies  Allergen Reactions  . Hydrochlorothiazide     REACTION: rash    Social History   Socioeconomic History  . Marital status: Married    Spouse name: None  . Number of children: None  . Years of education: None  . Highest education level: None  Social Needs  . Financial resource strain: None  . Food insecurity - worry: None  . Food insecurity - inability: None  . Transportation needs - medical: None  . Transportation needs - non-medical:  None  Occupational History  . None  Tobacco Use  . Smoking status: Never Smoker  . Smokeless tobacco: Never Used  Substance and Sexual Activity  . Alcohol use: Yes    Alcohol/week: 4.2 oz    Types: 7 Standard drinks or equivalent per week  . Drug use: No  . Sexual activity: Yes    Comment: 1st intercourse 61 yo-Fewer than 5 partners  Other Topics Concern  . None  Social History Narrative   Married. 2 living children, 1 died at birth. 4 grandchildren, 1 step grandchild.    Lives with husband.       Former teacher-retired.    Prior with [employer]- [employer]s etc. Stressful boss- no woff   Working with [employer] with the [place] now.       Hobbies: rummy cube (tile game), antiquing, cooking, gardening    Objective: BP 136/82 (BP Location: Left Arm, Patient Position: Sitting, Cuff Size:  Large)   Pulse 69   Temp 99 F (37.2 C) (Oral)   Ht 5' 6.5" (1.689 m)   Wt 140 lb 6.4 oz (63.7 kg)   LMP 10/02/2014   SpO2 99%   BMI 22.32 kg/m  Gen: NAD, resting comfortably HEENT: Mucous membranes are moist. Oropharynx normal Neck: no thyromegaly CV: RRR no murmurs rubs or gallops Lungs: CTAB no crackles, wheeze, rhonchi Abdomen: soft/nontender/nondistended/normal bowel sounds. No rebound or guarding.  Ext: no edema Skin: warm, dry, on left upper back- excoriated seborrheic keratosis to monitor under 1 cm in length Neuro: grossly normal, moves all extremities, PERRLA  Assessment/Plan:  61 y.o. female presenting for annual physical.  Health Maintenance counseling: 1. Anticipatory guidance: Patient counseled regarding regular dental exams -q6 months, eye exams - yearly, wearing seatbelts.  2. Risk factor reduction:  Advised patient of need for regular exercise and diet rich and fruits and vegetables to reduce risk of heart attack and stroke. Exercise- not exercising due to work schedule. Diet-Reasonable diet/maintaining weight.  Wt Readings from Last 3 Encounters:  12/02/17 140 lb 6.4 oz (63.7 kg)  11/30/16 143 lb (64.9 kg)  11/30/16 143 lb 6.4 oz (65 kg)  3. Immunizations/screenings/ancillary studies- discussed shingrix availability issues. Screen HIV and HCV this year with labs.  Immunization History  Administered Date(s) Administered  . Influenza Split 09/10/2011, 09/18/2012  . Influenza Whole 09/07/2010  . Influenza,inj,Quad PF,6+ Mos 09/24/2013, 09/25/2014, 10/16/2015, 09/17/2016  . Influenza-Unspecified 08/17/2017  . PPD Test 04/10/2012  . Td 12/20/2000  . Tdap 09/18/2012  4. Cervical cancer screening-  sees GYN regularly. Last pap we have on file was 11/26/14 but this had negative high risk HPV so can be on 5 year plan. She is interested in repeat this year when she sees [place] Young 5. Breast cancer screening-  breast exam with GYN and mammogram last 10/2016 - they will  update mammogram 6. Colon cancer screening - 07/20/16 with 5 year repeat 7. Skin cancer screening- no regular dermatologist. advised regular sunscreen use. Denies worrisome, changing, or new skin lesions.  8. Osteoporosis screening - 01/09/16 at [facility]- 01/09/16 at [facility]- specific #s not available but frax not elevated. REasonable to repeat at 78. Does weight bearing exercises when able to get to the gym. Advised 800 units a day of vitamin D- likely has in multivitamin  Status of chronic or acute concerns   HTN- controlled on amlodipine 2.5mg , atenolol 50mg , losartan 100mg .   Mild HLD in past- LDL <100 last year though- update again.   Estradiol vaginal  tablets through GYN  Depression Depression (major in partial remission)- on Wellbutrin 150mg  (should have changed rx to one a day) due to insomnia on 300mg - she had to go back up to 300mg  with worsening depression (Winter particularly hard). PHQ9 of 10 and GAD7 of 12. We will trial very low dose zoloft at 25mg  to see if we can avoid sexual side effects- see back 6 weeks- also new diagnosis GAD unfortunately  Future Appointments  Date Time Provider [place]  12/05/2017  7:10 AM [facility] 3 [facility]  12/05/2017  2:00 PM Young, Candiss Norse, NP [facility]   Return in about 6 weeks (around 01/13/2018).  Return precautions advised.  Garret Reddish, MD

## 2017-12-02 NOTE — Patient Instructions (Addendum)
Advised 800 units a day of vitamin D- likely has in multivitamin  6 week recheck on anxiety and depression    Taking the medicine as directed and not missing any doses is one of the best things you can do to treat your depression and anxiety.  Here are some things to keep in mind:  1) Side effects (stomach upset, some increased anxiety) may happen before you notice a benefit.  These side effects typically go away over time. 2) Changes to your dose of medicine or a change in medication all together is sometimes necessary 3) Most people need to be on medication at least 6-12 months 4) Many people will notice an improvement within two weeks but the full effect of the medication can take up to 4-6 weeks 5) Stopping the medication when you start feeling better often results in a return of symptoms 6) If you start having thoughts of hurting yourself or others after starting this medicine, call our office immediately at [phone] or seek care through 911.

## 2017-12-02 NOTE — Assessment & Plan Note (Signed)
Depression (major in partial remission)- on Wellbutrin 150mg  (should have changed rx to one a day) due to insomnia on 300mg - she had to go back up to 300mg  with worsening depression (Winter particularly hard). PHQ9 of 10 and GAD7 of 12. We will trial very low dose zoloft at 25mg  to see if we can avoid sexual side effects- see back 6 weeks- also new diagnosis GAD unfortunately

## 2017-12-03 LAB — HEPATITIS C ANTIBODY
HEP C AB: NONREACTIVE
SIGNAL TO CUT-OFF: 0.01 (ref <1.00)

## 2017-12-03 LAB — HIV ANTIBODY (ROUTINE TESTING W REFLEX): HIV 1&2 Ab, 4th Generation: NONREACTIVE

## 2017-12-05 ENCOUNTER — Ambulatory Visit: Payer: BC Managed Care – PPO | Admitting: Women's Health

## 2017-12-05 ENCOUNTER — Encounter: Payer: Self-pay | Admitting: Women's Health

## 2017-12-05 ENCOUNTER — Ambulatory Visit
Admission: RE | Admit: 2017-12-05 | Discharge: 2017-12-05 | Disposition: A | Discharge status: Home / Self Care | Payer: BC Managed Care – PPO | Source: Ambulatory Visit | Attending: Family Medicine | Admitting: Family Medicine

## 2017-12-05 VITALS — BP 120/80 | Ht 66.0 in | Wt 140.0 lb

## 2017-12-05 DIAGNOSIS — N952 Postmenopausal atrophic vaginitis: Secondary | ICD-10-CM

## 2017-12-05 DIAGNOSIS — Z1231 Encounter for screening mammogram for malignant neoplasm of breast: Secondary | ICD-10-CM

## 2017-12-05 DIAGNOSIS — Z01419 Encounter for gynecological examination (general) (routine) without abnormal findings: Secondary | ICD-10-CM

## 2017-12-05 MED ORDER — ESTRADIOL 10 MCG VA TABS: 1.0000 | ORAL_TABLET | VAGINAL | 12 refills | Status: DC

## 2017-12-05 NOTE — Patient Instructions (Signed)
Health Maintenance for Postmenopausal Women Menopause is a normal process in which your reproductive ability comes to an end. This process happens gradually over a span of months to years, usually between the ages of 22 and 9. Menopause is complete when you have missed 12 consecutive menstrual periods. It is important to talk with your health care provider about some of the most common conditions that affect postmenopausal women, such as heart disease, cancer, and bone loss (osteoporosis). Adopting a healthy lifestyle and getting preventive care can help to promote your health and wellness. Those actions can also lower your chances of developing some of these common conditions. What should I know about menopause? During menopause, you may experience a number of symptoms, such as:  Moderate-to-severe hot flashes.  Night sweats.  Decrease in sex drive.  Mood swings.  Headaches.  Tiredness.  Irritability.  Memory problems.  Insomnia.  Choosing to treat or not to treat menopausal changes is an individual decision that you make with your health care provider. What should I know about hormone replacement therapy and supplements? Hormone therapy products are effective for treating symptoms that are associated with menopause, such as hot flashes and night sweats. Hormone replacement carries certain risks, especially as you become older. If you are thinking about using estrogen or estrogen with progestin treatments, discuss the benefits and risks with your health care provider. What should I know about heart disease and stroke? Heart disease, heart attack, and stroke become more likely as you age. This may be due, in part, to the hormonal changes that your body experiences during menopause. These can affect how your body processes dietary fats, triglycerides, and cholesterol. Heart attack and stroke are both medical emergencies. There are many things that you can do to help prevent heart disease  and stroke:  Have your blood pressure checked at least every 1-2 years. High blood pressure causes heart disease and increases the risk of stroke.  If you are 53-22 years old, ask your health care provider if you should take aspirin to prevent a heart attack or a stroke.  Do not use any tobacco products, including cigarettes, chewing tobacco, or electronic cigarettes. If you need help quitting, ask your health care provider.  It is important to eat a healthy diet and maintain a healthy weight. ? Be sure to include plenty of vegetables, fruits, low-fat dairy products, and lean protein. ? Avoid eating foods that are high in solid fats, added sugars, or salt (sodium).  Get regular exercise. This is one of the most important things that you can do for your health. ? Try to exercise for at least 150 minutes each week. The type of exercise that you do should increase your heart rate and make you sweat. This is known as moderate-intensity exercise. ? Try to do strengthening exercises at least twice each week. Do these in addition to the moderate-intensity exercise.  Know your numbers.Ask your health care provider to check your cholesterol and your blood glucose. Continue to have your blood tested as directed by your health care provider.  What should I know about cancer screening? There are several types of cancer. Take the following steps to reduce your risk and to catch any cancer development as early as possible. Breast Cancer  Practice breast self-awareness. ? This means understanding how your breasts normally appear and feel. ? It also means doing regular breast self-exams. Let your health care provider know about any changes, no matter how small.  If you are 40  or older, have a clinician do a breast exam (clinical breast exam or CBE) every year. Depending on your age, family history, and medical history, it may be recommended that you also have a yearly breast X-ray (mammogram).  If you  have a family history of breast cancer, talk with your health care provider about genetic screening.  If you are at high risk for breast cancer, talk with your health care provider about having an MRI and a mammogram every year.  Breast cancer (BRCA) gene test is recommended for women who have family members with BRCA-related cancers. Results of the assessment will determine the need for genetic counseling and BRCA1 and for BRCA2 testing. BRCA-related cancers include these types: ? Breast. This occurs in males or females. ? Ovarian. ? Tubal. This may also be called fallopian tube cancer. ? Cancer of the abdominal or pelvic lining (peritoneal cancer). ? Prostate. ? Pancreatic.  Cervical, Uterine, and Ovarian Cancer Your health care provider may recommend that you be screened regularly for cancer of the pelvic organs. These include your ovaries, uterus, and vagina. This screening involves a pelvic exam, which includes checking for microscopic changes to the surface of your cervix (Pap test).  For women ages 21-65, health care providers may recommend a pelvic exam and a Pap test every three years. For women ages 79-65, they may recommend the Pap test and pelvic exam, combined with testing for human papilloma virus (HPV), every five years. Some types of HPV increase your risk of cervical cancer. Testing for HPV may also be done on women of any age who have unclear Pap test results.  Other health care providers may not recommend any screening for nonpregnant women who are considered low risk for pelvic cancer and have no symptoms. Ask your health care provider if a screening pelvic exam is right for you.  If you have had past treatment for cervical cancer or a condition that could lead to cancer, you need Pap tests and screening for cancer for at least 20 years after your treatment. If Pap tests have been discontinued for you, your risk factors (such as having a new sexual partner) need to be  reassessed to determine if you should start having screenings again. Some women have medical problems that increase the chance of getting cervical cancer. In these cases, your health care provider may recommend that you have screening and Pap tests more often.  If you have a family history of uterine cancer or ovarian cancer, talk with your health care provider about genetic screening.  If you have vaginal bleeding after reaching menopause, tell your health care provider.  There are currently no reliable tests available to screen for ovarian cancer.  Lung Cancer Lung cancer screening is recommended for adults 69-62 years old who are at high risk for lung cancer because of a history of smoking. A yearly low-dose CT scan of the lungs is recommended if you:  Currently smoke.  Have a history of at least 30 pack-years of smoking and you currently smoke or have quit within the past 15 years. A pack-year is smoking an average of one pack of cigarettes per day for one year.  Yearly screening should:  Continue until it has been 15 years since you quit.  Stop if you develop a health problem that would prevent you from having lung cancer treatment.  Colorectal Cancer  This type of cancer can be detected and can often be prevented.  Routine colorectal cancer screening usually begins at  age 42 and continues through age 45.  If you have risk factors for colon cancer, your health care provider may recommend that you be screened at an earlier age.  If you have a family history of colorectal cancer, talk with your health care provider about genetic screening.  Your health care provider may also recommend using home test kits to check for hidden blood in your stool.  A small camera at the end of a tube can be used to examine your colon directly (sigmoidoscopy or colonoscopy). This is done to check for the earliest forms of colorectal cancer.  Direct examination of the colon should be repeated every  5-10 years until age 71. However, if early forms of precancerous polyps or small growths are found or if you have a family history or genetic risk for colorectal cancer, you may need to be screened more often.  Skin Cancer  Check your skin from head to toe regularly.  Monitor any moles. Be sure to tell your health care provider: ? About any new moles or changes in moles, especially if there is a change in a mole's shape or color. ? If you have a mole that is larger than the size of a pencil eraser.  If any of your family members has a history of skin cancer, especially at a young age, talk with your health care provider about genetic screening.  Always use sunscreen. Apply sunscreen liberally and repeatedly throughout the day.  Whenever you are outside, protect yourself by wearing long sleeves, pants, a wide-brimmed hat, and sunglasses.  What should I know about osteoporosis? Osteoporosis is a condition in which bone destruction happens more quickly than new bone creation. After menopause, you may be at an increased risk for osteoporosis. To help prevent osteoporosis or the bone fractures that can happen because of osteoporosis, the following is recommended:  If you are 46-71 years old, get at least 1,000 mg of calcium and at least 600 mg of vitamin D per day.  If you are older than age 55 but younger than age 65, get at least 1,200 mg of calcium and at least 600 mg of vitamin D per day.  If you are older than age 54, get at least 1,200 mg of calcium and at least 800 mg of vitamin D per day.  Smoking and excessive alcohol intake increase the risk of osteoporosis. Eat foods that are rich in calcium and vitamin D, and do weight-bearing exercises several times each week as directed by your health care provider. What should I know about how menopause affects my mental health? Depression may occur at any age, but it is more common as you become older. Common symptoms of depression  include:  Low or sad mood.  Changes in sleep patterns.  Changes in appetite or eating patterns.  Feeling an overall lack of motivation or enjoyment of activities that you previously enjoyed.  Frequent crying spells.  Talk with your health care provider if you think that you are experiencing depression. What should I know about immunizations? It is important that you get and maintain your immunizations. These include:  Tetanus, diphtheria, and pertussis (Tdap) booster vaccine.  Influenza every year before the flu season begins.  Pneumonia vaccine.  Shingles vaccine.  Your health care provider may also recommend other immunizations. This information is not intended to replace advice given to you by your health care provider. Make sure you discuss any questions you have with your health care provider. Document Released: 01/28/2006  Document Revised: 06/25/2016 Document Reviewed: 09/09/2015 Elsevier Interactive Patient Education  2018 Elsevier Inc.  

## 2017-12-05 NOTE — Progress Notes (Signed)
..  lb

## 2017-12-05 NOTE — Progress Notes (Signed)
Sharon Medina [facility] October 02, 1956 [id number]    History:    Presents for annual exam. Postmenopausal with no bleeding on Vagifem. 2017 T score -1.5 femoral neck FRAX 7.5%/0.6%. 2017 tubular adenoma 5 year follow-up. Normal Pap and mammogram history had today. Primary care manages hypertension, anxiety and depression.   Past medical history, past surgical history, family history and social history were all reviewed and documented in the EPIC chart. Retired Pharmacist, hospital.  ROS:  A ROS was performed and pertinent positives and negatives are included.  Exam:  Vitals:   12/05/17 1407  BP: 120/80  Weight: 140 lb (63.5 kg)  Height: 5\' 6"  (1.676 m)   Body mass index is 22.6 kg/m.   General appearance:  Normal Thyroid:  Symmetrical, normal in size, without palpable masses or nodularity. Respiratory  Auscultation:  Clear without wheezing or rhonchi Cardiovascular  Auscultation:  Regular rate, without rubs, murmurs or gallops  Edema/varicosities:  Not grossly evident Abdominal  Soft,nontender, without masses, guarding or rebound.  Liver/spleen:  No organomegaly noted  Hernia:  None appreciated  Skin  Inspection:  Grossly normal   Breasts: Examined lying and sitting.     Right: Without masses, retractions, discharge or axillary adenopathy.     Left: Without masses, retractions, discharge or axillary adenopathy. Gentitourinary   Inguinal/mons:  Normal without inguinal adenopathy  External genitalia:  Normal  BUS/Urethra/Skene's glands:  Normal  Vagina:  Normal  Cervix:  Normal  Uterus:  normal in size, shape and contour.  Midline and mobile  Adnexa/parametria:     Rt: Without masses or tenderness.   Lt: Without masses or tenderness.  Anus and perineum: Normal  Digital rectal exam: Normal sphincter tone without palpated masses or tenderness  Assessment/Plan:  62 y.o. MWF G3 P3 for annual exam with no complaints.  Postmenopausal with no bleeding on Vagifem Osteopenia without elevated  FRAX Hypertension, anxiety/depression-primary care manages labs and meds  Plan: Vagifem prescription, proper use given and reviewed uses twice weekly. Encouraged vaginal lubricants with intercourse. SBE's, continue annual screening mammogram, calcium rich diet, exercise reviewed. Reviewed importance of home safety, fall prevention and weightbearing exercise. Pap with HR HPV typing, new screening guidelines reviewed.    Huel Cote [facility], 6:31 PM 12/05/2017

## 2017-12-07 LAB — PAP, TP IMAGING W/ HPV RNA, RFLX HPV TYPE 16,18/45: HPV DNA High Risk: NOT DETECTED

## 2017-12-22 ENCOUNTER — Ambulatory Visit: Payer: BC Managed Care – PPO | Admitting: Family Medicine

## 2017-12-22 ENCOUNTER — Encounter: Payer: Self-pay | Admitting: Family Medicine

## 2017-12-22 VITALS — BP 130/88 | HR 66 | Temp 98.3°F | Ht 66.0 in | Wt 139.4 lb

## 2017-12-22 DIAGNOSIS — J069 Acute upper respiratory infection, unspecified: Secondary | ICD-10-CM | POA: Diagnosis not present

## 2017-12-22 MED ORDER — PROMETHAZINE-DM 6.25-15 MG/5ML PO SYRP: 5.0000 mL | ORAL_SOLUTION | Freq: Every evening | ORAL | 0 refills | Status: DC | PRN

## 2017-12-22 NOTE — Patient Instructions (Signed)
Upper Respiratory infection History and exam today are suggestive of viral infection most likely due to upper respiratory infection. Symptomatic treatment with: phenergan- DM at night and if going to be at home for 8 hours. Can use plain delsym in the day if going to be driving/out.   We discussed that we did not find any infection that had higher probability of being bacterial such as pneumonia or strep throat or ear infection or sinusitis. We discussed signs that bacterial infection may have developed particularly fever or shortness of breath. Likely course of 2 weeks. Patient is contagious and advised good handwashing and consideration of mask If going to be in public places.   Finally, we reviewed reasons to return to care including if symptoms worsen or persist past 2 weeks (advised to come back Monday after next and would get chest x-ray in this case most likely) or new concerns arise- once again particularly shortness of breath or fever.  Meds ordered this encounter  Medications  . promethazine-dextromethorphan (PROMETHAZINE-DM) 6.25-15 MG/5ML syrup    Sig: Take 5 mLs by mouth at bedtime as needed for cough (can take in daytime if going to be home for 8 hours and can lay down).    Dispense:  120 mL    Refill:  0

## 2017-12-22 NOTE — Progress Notes (Signed)
PCP: Marin Olp, MD  Subjective:  Sharon Medina is a 62 y.o. year old very pleasant female patient who presents with Upper Respiratory infection symptoms including sore throat starting on right sie and later moving to left, cough, congestion, Ears now feel stopped up as of last night Patient feels cough is deep and worse at night. Only slightly productive. Has noted small bump in back of her throat at times.  -started: 5 days ago, symptoms show no change -previous treatments: rest, hydration.  -sick contacts/travel/risks: denies flu exposure.   ROS-denies fever, SOB, NVD, tooth pain  Pertinent Past Medical History- hypertension, GAD, allergic rhinitis on allegra D  Medications- reviewed  Current Outpatient Medications  Medication Sig Dispense Refill  . amLODipine (NORVASC) 5 MG tablet Take 1 tablet (5 mg total) by mouth daily. 90 tablet 3  . atenolol (TENORMIN) 50 MG tablet take 1 tablet by mouth once daily 90 tablet 2  . buPROPion (WELLBUTRIN XL) 150 MG 24 hr tablet Take 2 tablets (300 mg total) by mouth daily. 180 tablet 3  . CHIA SEED PO Take by mouth. Patient states she grinds chia seed and incorporates into diet.    . Estradiol 10 MCG TABS vaginal tablet Place 1 tablet (10 mcg total) vaginally 2 (two) times a week. 8 tablet 12  . fexofenadine-pseudoephedrine (ALLEGRA-D 24) 180-240 MG 24 hr tablet Take 1 tablet by mouth as needed.    . Flaxseed, Linseed, (FLAX SEEDS PO) Take by mouth. Patient states she grinds flax seeds and incorporates into diet.    Marland Kitchen glucosamine-chondroitin 500-400 MG tablet Take 1 tablet by mouth 2 (two) times daily as needed.      Marland Kitchen losartan (COZAAR) 100 MG tablet take 1 tablet by mouth once daily 90 tablet 2  . sertraline (ZOLOFT) 25 MG tablet Take 1 tablet (25 mg total) by mouth daily. 30 tablet 5  . promethazine-dextromethorphan (PROMETHAZINE-DM) 6.25-15 MG/5ML syrup Take 5 mLs by mouth at bedtime as needed for cough (can take in daytime if going to be  home for 8 hours and can lay down). 120 mL 0   No current facility-administered medications for this visit.     Objective: BP 130/88 (BP Location: Left Arm, Patient Position: Sitting, Cuff Size: Large)   Pulse 66   Temp 98.3 F (36.8 C) (Oral)   Ht 5\' 6"  (1.676 m)   Wt 139 lb 6.4 oz (63.2 kg)   LMP 10/02/2014   SpO2 98%   BMI 22.50 kg/m  Gen: NAD, resting comfortably HEENT: Turbinates erythematous, TM normal, pharynx mildly erythematous with no tonsilar exudate or edema, no sinus tenderness CV: RRR no murmurs rubs or gallops Lungs: CTAB no crackles, wheeze, rhonchi Abdomen: soft/nontender/nondistended/normal bowel sounds. No rebound or guarding.  Ext: no edema Skin: warm, dry, no rash Neuro: grossly normal, moves all extremities  Assessment/Plan:  Upper Respiratory infection History and exam today are suggestive of viral infection most likely due to upper respiratory infection. Symptomatic treatment with: phenergan- DM at night and if going to be at home for 8 hours. Can use plain delsym in the day if going to be driving/out.   We discussed that we did not find any infection that had higher probability of being bacterial such as pneumonia or strep throat or ear infection or sinusitis. We discussed signs that bacterial infection may have developed particularly fever or shortness of breath. Likely course of 2 weeks. Patient is contagious and advised good handwashing and consideration of mask If going  to be in public places.   Finally, we reviewed reasons to return to care including if symptoms worsen or persist past 2 weeks (advised to come back Monday after next and would get chest x-ray in this case most likely) or new concerns arise- once again particularly shortness of breath or fever.  Should discuss in future avoiding decongestant products given her blood pressure issues  Meds ordered this encounter  Medications  . promethazine-dextromethorphan (PROMETHAZINE-DM) 6.25-15  MG/5ML syrup    Sig: Take 5 mLs by mouth at bedtime as needed for cough (can take in daytime if going to be home for 8 hours and can lay down).    Dispense:  120 mL    Refill:  0    Garret Reddish, MD

## 2017-12-29 ENCOUNTER — Other Ambulatory Visit: Payer: Self-pay

## 2017-12-29 MED ORDER — BUPROPION HCL ER (XL) 150 MG PO TB24: 300.0000 mg | ORAL_TABLET | Freq: Every day | ORAL | 3 refills | Status: DC

## 2018-01-23 ENCOUNTER — Ambulatory Visit: Payer: BC Managed Care – PPO | Admitting: Family Medicine

## 2018-01-23 ENCOUNTER — Other Ambulatory Visit: Payer: Self-pay | Admitting: Family Medicine

## 2018-01-23 MED ORDER — OSELTAMIVIR PHOSPHATE 75 MG PO CAPS: 75.0000 mg | ORAL_CAPSULE | Freq: Every day | ORAL | 0 refills | Status: DC

## 2018-01-23 NOTE — Progress Notes (Signed)
Husband influenza A positive. Send in prophylaxis tamiflu

## 2018-01-25 ENCOUNTER — Ambulatory Visit: Payer: BC Managed Care – PPO | Admitting: Family Medicine

## 2018-01-25 ENCOUNTER — Encounter: Payer: Self-pay | Admitting: Family Medicine

## 2018-01-25 VITALS — BP 118/80 | HR 73 | Temp 98.3°F | Ht 66.0 in | Wt 141.0 lb

## 2018-01-25 DIAGNOSIS — I10 Essential (primary) hypertension: Secondary | ICD-10-CM | POA: Diagnosis not present

## 2018-01-25 DIAGNOSIS — F3342 Major depressive disorder, recurrent, in full remission: Secondary | ICD-10-CM

## 2018-01-25 NOTE — Patient Instructions (Addendum)
Try to avoid anything with congestants like allegra-D. Plain allegra is fine- I took allegra D off your list  Very pleased with the improvement-keep up with current medications.   Perhaps recheck in 6 months unless you feel you need to see [place] sooner. Obviously if you had worsening symptoms please see [place] back we have a lot of room where we could go up on zoloft if needed

## 2018-01-25 NOTE — Assessment & Plan Note (Addendum)
GAD S: last visit phq9 of 10 and gad7 of 12 on wellbutrin 300mg  (we tried 150mg  due to insomnia but worsened depression so had to go back up, plus winter months are hard for hard for her).   Last visit- We added zoloft 25mg - she is concerned about sexual SE with 6 week follow up- so she returns today  Today, PHQ9 2 and GAD7 3. No si. Minimal sexual SE at this dose A/P: from avs "Very pleased with the improvement-keep up with current medications. " excellent control of recurrent major depression and GAD

## 2018-01-25 NOTE — Assessment & Plan Note (Signed)
S: controlled on amlodipine 2.5 mg, atenolol 50mg , losartan 100mg . We discussed watching out for decongestants like allegra D - she think she is only taking allegra but will confirm at home.  BP Readings from Last 3 Encounters:  01/25/18 118/80  12/22/17 130/88  12/05/17 120/80  A/P: We discussed blood pressure goal of <140/90. Continue current meds:

## 2018-01-25 NOTE — Progress Notes (Signed)
Subjective:  Sharon Medina is a 62 y.o. year old very pleasant female patient who presents for/with See problem oriented charting ROS-  No chest pain or shortness of breath. No headache or blurry vision.  No SI.  Some mild sinus congestion- luckily no flu like symptoms like husband- she is in [place]  Past Medical History-  Patient Active Problem List   Diagnosis Date Noted  . Osteopenia 12/02/2017    Priority: Medium  . GAD (generalized anxiety disorder) 12/02/2017    Priority: Medium  . Major depression in full remission ([place]) 08/24/2007    Priority: Medium  . Essential hypertension 08/24/2007    Priority: Medium  . History of colonic polyps 06/13/2008    Priority: Low  . Allergic rhinitis 08/24/2007    Priority: Low    Medications- reviewed and updated Current Outpatient Medications  Medication Sig Dispense Refill  . amLODipine (NORVASC) 5 MG tablet Take 1 tablet (5 mg total) by mouth daily. 90 tablet 3  . atenolol (TENORMIN) 50 MG tablet take 1 tablet by mouth once daily 90 tablet 2  . buPROPion (WELLBUTRIN XL) 150 MG 24 hr tablet Take 2 tablets (300 mg total) by mouth daily. 180 tablet 3  . CHIA SEED PO Take by mouth. Patient states she grinds chia seed and incorporates into diet.    . Estradiol 10 MCG TABS vaginal tablet Place 1 tablet (10 mcg total) vaginally 2 (two) times a week. 8 tablet 12  . Flaxseed, Linseed, (FLAX SEEDS PO) Take by mouth. Patient states she grinds flax seeds and incorporates into diet.    Marland Kitchen glucosamine-chondroitin 500-400 MG tablet Take 1 tablet by mouth 2 (two) times daily as needed.      Marland Kitchen losartan (COZAAR) 100 MG tablet take 1 tablet by mouth once daily 90 tablet 2  . oseltamivir (TAMIFLU) 75 MG capsule Take 1 capsule (75 mg total) by mouth daily. For prophylaxis 10 capsule 0  . sertraline (ZOLOFT) 25 MG tablet Take 1 tablet (25 mg total) by mouth daily. 30 tablet 5   No current facility-administered medications for this visit.      Objective: BP 118/80 (BP Location: Left Arm, Patient Position: Sitting, Cuff Size: Large)   Pulse 73   Temp 98.3 F (36.8 C) (Oral)   Ht 5\' 6"  (1.676 m)   Wt 141 lb (64 kg)   LMP 10/02/2014   SpO2 99%   BMI 22.76 kg/m  Gen: NAD, resting comfortably CV: RRR no murmurs rubs or gallops Lungs: CTAB no crackles, wheeze, rhonchi Abdomen: soft/nontender/nondistended/normal bowel sounds. Healthy weight Ext: no edema Skin: warm, dry  Assessment/Plan:  Major depression in full remission ([place]) GAD S: last visit phq9 of 10 and gad7 of 12 on wellbutrin 300mg  (we tried 150mg  due to insomnia but worsened depression so had to go back up, plus winter months are hard for hard for her).   Last visit- We added zoloft 25mg - she is concerned about sexual SE with 6 week follow up- so she returns today  Today, PHQ9 2 and GAD7 3. No si. Minimal sexual SE at this dose A/P: from avs "Very pleased with the improvement-keep up with current medications. " excellent control of recurrent major depression and GAD  Essential hypertension S: controlled on amlodipine 2.5 mg, atenolol 50mg , losartan 100mg . We discussed watching out for decongestants like allegra D - she think she is only taking allegra but will confirm at home.  BP Readings from Last 3 Encounters:  01/25/18 118/80  12/22/17 130/88  12/05/17 120/80  A/P: We discussed blood pressure goal of <140/90. Continue current meds:    Future Appointments  Date Time Provider [place]  07/25/2018  8:30 AM Marin Olp, MD [facility]  about 6 month follow up  Return precautions advised.  Garret Reddish, MD

## 2018-02-15 ENCOUNTER — Other Ambulatory Visit: Payer: Self-pay | Admitting: Family Medicine

## 2018-05-21 ENCOUNTER — Other Ambulatory Visit: Payer: Self-pay | Admitting: Family Medicine

## 2018-07-25 ENCOUNTER — Encounter: Payer: Self-pay | Admitting: Family Medicine

## 2018-07-25 ENCOUNTER — Ambulatory Visit: Payer: BC Managed Care – PPO | Admitting: Family Medicine

## 2018-07-25 VITALS — BP 116/76 | HR 60 | Temp 98.8°F | Ht 66.0 in | Wt 140.0 lb

## 2018-07-25 DIAGNOSIS — F3342 Major depressive disorder, recurrent, in full remission: Secondary | ICD-10-CM

## 2018-07-25 DIAGNOSIS — G5603 Carpal tunnel syndrome, bilateral upper limbs: Secondary | ICD-10-CM

## 2018-07-25 DIAGNOSIS — F411 Generalized anxiety disorder: Secondary | ICD-10-CM

## 2018-07-25 DIAGNOSIS — I10 Essential (primary) hypertension: Secondary | ICD-10-CM | POA: Diagnosis not present

## 2018-07-25 NOTE — Assessment & Plan Note (Signed)
GAD S: PHQ9 was controlled last visit on wellbutrin 300mg  and zoloft 25mg .  GAD7 also controlled at that time- she states anxiety is similar this time. PHQ9 controlled today at level 3.  A/P: continue current rx

## 2018-07-25 NOTE — Patient Instructions (Addendum)
Health Maintenance Due  Topic Date Due  . Please check with your pharmacy to see if they have the shingrix vaccine. If they do- please get this immunization and update [place] by phone call or mychart with dates you receive the vaccine   . INFLUENZA VACCINE - Patient will schedule a nurse visit today at the end of visit. 07/20/2018   Keep same depression meds- glad things are going well. Glad you have options to reduce stress if needed (or find new stressors in caring for children!)  Blood pressure looks great  Try on both hands cock up wrist splint at night. May want to experiment just with right hand for a week or two first to see if helpful. You can also try ice/heat on the area up to 3x a day for 15 to 20 minutes. Ibuprofen/aleve products can help as well.   12/02/18 or later for physical- can schedule before leave

## 2018-07-25 NOTE — Progress Notes (Signed)
Subjective:  Sharon Medina is a 62 y.o. year old very pleasant female patient who presents for/with See problem oriented charting ROS- No chest pain or shortness of breath. No headache or blurry vision.    Past Medical History-  Patient Active Problem List   Diagnosis Date Noted  . Osteopenia 12/02/2017    Priority: Medium  . GAD (generalized anxiety disorder) 12/02/2017    Priority: Medium  . Major depression in full remission ([place]) 08/24/2007    Priority: Medium  . Essential hypertension 08/24/2007    Priority: Medium  . History of colonic polyps 06/13/2008    Priority: Low  . Allergic rhinitis 08/24/2007    Priority: Low    Medications- reviewed and updated Current Outpatient Medications  Medication Sig Dispense Refill  . amLODipine (NORVASC) 5 MG tablet Take 1 tablet (5 mg total) by mouth daily. 90 tablet 3  . atenolol (TENORMIN) 50 MG tablet take 1 tablet by mouth once daily 90 tablet 2  . buPROPion (WELLBUTRIN XL) 150 MG 24 hr tablet Take 2 tablets (300 mg total) by mouth daily. 180 tablet 3  . CHIA SEED PO Take by mouth. Patient states she grinds chia seed and incorporates into diet.    . Estradiol 10 MCG TABS vaginal tablet Place 1 tablet (10 mcg total) vaginally 2 (two) times a week. 8 tablet 12  . Flaxseed, Linseed, (FLAX SEEDS PO) Take by mouth. Patient states she grinds flax seeds and incorporates into diet.    Marland Kitchen glucosamine-chondroitin 500-400 MG tablet Take 1 tablet by mouth 2 (two) times daily as needed.      Marland Kitchen losartan (COZAAR) 100 MG tablet take 1 tablet by mouth once daily 90 tablet 2  . sertraline (ZOLOFT) 25 MG tablet TAKE 1 TABLET BY MOUTH ONCE DAILY 30 tablet 5   No current facility-administered medications for this visit.     Objective: BP 116/76 (BP Location: Left Arm, Patient Position: Sitting, Cuff Size: Normal)   Pulse 60   Temp 98.8 F (37.1 C) (Oral)   Ht 5\' 6"  (1.676 m)   Wt 140 lb (63.5 kg)   LMP 10/02/2014   SpO2 99%   BMI 22.60 kg/m   Gen: NAD, resting comfortably CV: RRR no murmurs rubs or gallops Lungs: CTAB no crackles, wheeze, rhonchi Abdomen: soft/nontender/nondistended/normal bowel sounds. Ext: no edema Skin: warm, dry Msk: positive tinel and Phalen test Assessment/Plan:  Other notes: 1. working with trainer for IT band.  2. She is also concerned she may be dealing with some CMC arthritis- aches in area 3. also. Waking up in middle of night with fingers tingling- more on right, can be left. She thinks it is all her fingers. Hanging off side of bed seems to help some. Will go away. Happens when wakes in AM as well. Better with nsaids.  - positive tinel and Phalen test - this looks like carpal tunnel- discussed conservative care with cock up wrist splint at night  Major depression in full remission ([place]) GAD S: PHQ9 was controlled last visit on wellbutrin 300mg  and zoloft 25mg .  GAD7 also controlled at that time- she states anxiety is similar this time. PHQ9 controlled today at level 3.  A/P: continue current rx  Essential hypertension S: controlled on  amlodipine 5mg , atenolol 50mg , losartan 100mg .  BP Readings from Last 3 Encounters:  07/25/18 116/76  01/25/18 118/80  12/22/17 130/88  A/P: We discussed blood pressure goal of <140/90. Continue current meds  For CPE. Can either be  fasting, do nonfasting labs, or come back for fasting labs after visit Future Appointments  Date Time Provider [place]  12/04/2018  2:30 PM Marin Olp, MD [facility]   Return precautions advised.  Garret Reddish, MD

## 2018-07-25 NOTE — Assessment & Plan Note (Signed)
S: controlled on  amlodipine 5mg , atenolol 50mg , losartan 100mg .  BP Readings from Last 3 Encounters:  07/25/18 116/76  01/25/18 118/80  12/22/17 130/88  A/P: We discussed blood pressure goal of <140/90. Continue current meds

## 2018-10-09 ENCOUNTER — Other Ambulatory Visit: Payer: Self-pay | Admitting: Family Medicine

## 2018-10-09 DIAGNOSIS — Z1231 Encounter for screening mammogram for malignant neoplasm of breast: Secondary | ICD-10-CM

## 2018-11-10 ENCOUNTER — Other Ambulatory Visit: Payer: Self-pay | Admitting: Family Medicine

## 2018-11-17 ENCOUNTER — Other Ambulatory Visit: Payer: Self-pay | Admitting: Family Medicine

## 2018-11-19 ENCOUNTER — Other Ambulatory Visit: Payer: Self-pay | Admitting: Family Medicine

## 2018-12-04 ENCOUNTER — Ambulatory Visit (INDEPENDENT_AMBULATORY_CARE_PROVIDER_SITE_OTHER): Payer: BC Managed Care – PPO | Admitting: Family Medicine

## 2018-12-04 ENCOUNTER — Encounter: Payer: Self-pay | Admitting: Family Medicine

## 2018-12-04 VITALS — BP 120/78 | HR 62 | Temp 98.3°F | Ht 66.0 in | Wt 140.6 lb

## 2018-12-04 DIAGNOSIS — I10 Essential (primary) hypertension: Secondary | ICD-10-CM | POA: Diagnosis not present

## 2018-12-04 DIAGNOSIS — F3342 Major depressive disorder, recurrent, in full remission: Secondary | ICD-10-CM | POA: Diagnosis not present

## 2018-12-04 DIAGNOSIS — Z0000 Encounter for general adult medical examination without abnormal findings: Secondary | ICD-10-CM | POA: Diagnosis not present

## 2018-12-04 NOTE — Patient Instructions (Addendum)
Please stop by lab before you go  Glad you are doing so well!  Will plan on shingrix at 6 month visit if still available   Merry Christmas!

## 2018-12-04 NOTE — Assessment & Plan Note (Signed)
GAD S: Depression remains controlled on Wellbutrin 300 mg and Zoloft 25 mg. PHQ9 of 2 today- doing well.  Anxiety is also reasonably controlled- still this is the worst of her issues- can still get anxious at times but very rare that its very bothersome A/P: Stable-continue current medications

## 2018-12-04 NOTE — Progress Notes (Signed)
Phone: [phone]  Subjective:  Patient presents today for their annual physical. Chief complaint-noted.   See problem oriented charting- ROS- full  review of systems was completed and negative except for: cough, joint pain, back pain, seasonal allergies, numbness improved with cock up wrist splints  The following were reviewed and entered/updated in epic: Past Medical History:  Diagnosis Date  . Claustrophobia    severe  . Depression   . H/O seasonal allergies   . Hemorrhoids   . Hypertension   . Postmenopausal HRT (hormone replacement therapy)    per pt, never took oral HR meds   Patient Active Problem List   Diagnosis Date Noted  . Osteopenia 12/02/2017    Priority: Medium  . GAD (generalized anxiety disorder) 12/02/2017    Priority: Medium  . Major depression in full remission ([place]) 08/24/2007    Priority: Medium  . Essential hypertension 08/24/2007    Priority: Medium  . History of colonic polyps 06/13/2008    Priority: Low  . Allergic rhinitis 08/24/2007    Priority: Low   Past Surgical History:  Procedure Laterality Date  . CESAREAN SECTION     x2  . COLONOSCOPY    . WISDOM TOOTH EXTRACTION      Family History  Problem Relation Age of Onset  . Prostate cancer Father        smoker  . Hypertension Father   . Ulcers Father   . Hypertension Mother        sister as well  . Colon cancer Paternal Aunt   . Gout Brother   . Heart disease Brother   . Gout Sister   . Hypertension Sister   . Colon cancer Paternal Aunt   . Gout Sister        brother    Medications- reviewed and updated Current Outpatient Medications  Medication Sig Dispense Refill  . amLODipine (NORVASC) 5 MG tablet Take 1 tablet (5 mg total) by mouth daily. 90 tablet 3  . atenolol (TENORMIN) 50 MG tablet TAKE 1 TABLET BY MOUTH ONCE DAILY 90 tablet 2  . buPROPion (WELLBUTRIN XL) 150 MG 24 hr tablet Take 2 tablets (300 mg total) by mouth daily. 180 tablet 3  . CHIA SEED PO Take by  mouth. Patient states she grinds chia seed and incorporates into diet.    . Estradiol 10 MCG TABS vaginal tablet Place 1 tablet (10 mcg total) vaginally 2 (two) times a week. 8 tablet 12  . Flaxseed, Linseed, (FLAX SEEDS PO) Take by mouth. Patient states she grinds flax seeds and incorporates into diet.    Marland Kitchen glucosamine-chondroitin 500-400 MG tablet Take 1 tablet by mouth 2 (two) times daily as needed.      Marland Kitchen losartan (COZAAR) 100 MG tablet TAKE 1 TABLET BY MOUTH ONCE DAILY 90 tablet 0  . sertraline (ZOLOFT) 25 MG tablet TAKE 1 TABLET BY MOUTH ONCE DAILY 30 tablet 5   No current facility-administered medications for this visit.     Allergies-reviewed and updated Allergies  Allergen Reactions  . Hydrochlorothiazide     REACTION: rash    Social History   Social History Narrative   Married. 2 living children, 1 died at birth. 4 grandchildren, 1 step grandchild.    Lives with husband.       Retired 2019   Former teacher-retired.    Prior with [employer]- caps and gowns etc. Stressful boss- no woff      Hobbies: rummy cube (tile game), antiquing, cooking, gardening  Objective: BP 120/78 (BP Location: Left Arm, Patient Position: Sitting, Cuff Size: Large)   Pulse 62   Temp 98.3 F (36.8 C) (Oral)   Ht 5\' 6"  (1.676 m)   Wt 140 lb 9.6 oz (63.8 kg)   LMP 10/02/2014   SpO2 97%   BMI 22.69 kg/m  Gen: NAD, resting comfortably HEENT: Mucous membranes are moist. Oropharynx normal Neck: no thyromegaly CV: RRR no murmurs rubs or gallops Lungs: CTAB no crackles, wheeze, rhonchi Abdomen: soft/nontender/nondistended/normal bowel sounds. No rebound or guarding.  Ext: no edema Skin: warm, dry Neuro: grossly normal, moves all extremities, PERRLA  Assessment/Plan:  62 y.o. female presenting for annual physical.  Health Maintenance counseling: 1. Anticipatory guidance: Patient counseled regarding regular dental exams -q6 months, eye exams - yearly,  avoiding smoking and second hand  smoke , limiting alcohol to 1 beverage per day- she is under this .   2. Risk factor reduction:  Advised patient of need for regular exercise and diet rich and fruits and vegetables to reduce risk of heart attack and stroke. Exercise- fell at the gym doing laterals late oct/early november and has not been back- hurt tailbone- healing up at this point but hesitant to restart as a result- is going to listen to her body and restart when feeling better. Diet-balanced diet.  Wt Readings from Last 3 Encounters:  12/04/18 140 lb 9.6 oz (63.8 kg)  07/25/18 140 lb (63.5 kg)  01/25/18 141 lb (64 kg)  3. Immunizations/screenings/ancillary studies- Already had flu shot. Will delay shingrix for now- possibly next visit Immunization History  Administered Date(s) Administered  . Influenza Split 09/10/2011, 09/18/2012  . Influenza Whole 09/07/2010  . Influenza,inj,Quad PF,6+ Mos 09/24/2013, 09/25/2014, 10/16/2015, 09/17/2016, 08/17/2017  . Influenza,inj,quad, With Preservative 11/06/2018  . Influenza-Unspecified 08/17/2017  . PPD Test 04/10/2012  . Td 12/20/2000  . Tdap 09/18/2012  4. Cervical cancer screening- sees GYN regularly.  Last Pap on file 2018- doing well 5. Breast cancer screening-  breast exam with GYN and mammogram 12/05/17 and has one scheduled next week 6. Colon cancer screening - 07/20/2016 with 5-year repeat 7. Skin cancer screening-no regular dermatologist.  Advised regular sunscreen use. Denies worrisome, changing, or new skin lesions.  8. Birth control/STD check- postmenopausal/monogomous 9. Osteoporosis screening at 65-have this done in 2017 and FRAX score apparently not elevated-I do not have the actual report.  This was done at [facility].  We will plan to repeat at age 34.  Have advised 800 units of vitamin D 10.  Never smoker  Status of chronic or acute concerns   Hypertension-controlled on amlodipine 5 mg, atenolol 50 mg, losartan 100 mg  Right side IT band much better now  Fingers  tingling at night-improves with pain arm outside of bed.  We discussed possible carpal tunnel with positive Tinel and Phalen test last visit. Cock up wrist splint has helped this tremendously.   CMC arthritis intermittent issues- feels like carpal tunnel brace helped this. wears thumb brace with certain actvities   Some allergies- leads to intermittent cough   Major depression in full remission ([facility]) GAD S: Depression remains controlled on Wellbutrin 300 mg and Zoloft 25 mg. PHQ9 of 2 today- doing well.  Anxiety is also reasonably controlled- still this is the worst of her issues- can still get anxious at times but very rare that its very bothersome A/P: Stable-continue current medications    Future Appointments  Date Time Provider [place]  12/06/2018  7:00 AM [facility]  [facility]  12/18/2018  4:20 PM Young, Candiss Norse, NP [facility]   Return in about 6 months (around 06/05/2019) for follow up- or sooner if needed.  Lab/Order associations: ate at 6 AM- cottage cheese and fresh fruit. Will be over 9 hours by time of lab stodya.    Preventative health care - Plan: CBC, Comprehensive metabolic panel, Lipid panel  Recurrent major depressive disorder, in full remission ([place])  Essential hypertension - Plan: CBC, Comprehensive metabolic panel, Lipid panel  Return precautions advised.  Garret Reddish, MD

## 2018-12-05 LAB — COMPREHENSIVE METABOLIC PANEL
ALBUMIN: 4.5 g/dL (ref 3.5–5.2)
ALK PHOS: 48 U/L (ref 39–117)
ALT: 16 U/L (ref 0–35)
AST: 20 U/L (ref 0–37)
BUN: 17 mg/dL (ref 6–23)
CHLORIDE: 102 meq/L (ref 96–112)
CO2: 30 mEq/L (ref 19–32)
Calcium: 9.6 mg/dL (ref 8.4–10.5)
Creatinine, Ser: 0.75 mg/dL (ref 0.40–1.20)
GFR: 83.08 mL/min (ref >60.00)
Glucose, Bld: 82 mg/dL (ref 70–99)
POTASSIUM: 4 meq/L (ref 3.5–5.1)
SODIUM: 139 meq/L (ref 135–145)
Total Bilirubin: 0.5 mg/dL (ref 0.2–1.2)
Total Protein: 6.6 g/dL (ref 6.0–8.3)

## 2018-12-05 LAB — LIPID PANEL
Cholesterol: 197 mg/dL (ref 0–200)
HDL: 91 mg/dL (ref >39.00)
LDL CALC: 97 mg/dL (ref 0–99)
NonHDL: 105.76
Total CHOL/HDL Ratio: 2
Triglycerides: 45 mg/dL (ref 0.0–149.0)
VLDL: 9 mg/dL (ref 0.0–40.0)

## 2018-12-05 LAB — CBC
HEMATOCRIT: 38.4 % (ref 36.0–46.0)
HEMOGLOBIN: 12.9 g/dL (ref 12.0–15.0)
MCHC: 33.5 g/dL (ref 30.0–36.0)
MCV: 89.8 fl (ref 78.0–100.0)
Platelets: 236 10*3/uL (ref 150.0–400.0)
RBC: 4.28 Mil/uL (ref 3.87–5.11)
RDW: 13.2 % (ref 11.5–15.5)
WBC: 5.9 10*3/uL (ref 4.0–10.5)

## 2018-12-06 ENCOUNTER — Ambulatory Visit
Admission: RE | Admit: 2018-12-06 | Discharge: 2018-12-06 | Disposition: A | Discharge status: Home / Self Care | Payer: BC Managed Care – PPO | Source: Ambulatory Visit | Attending: Family Medicine | Admitting: Family Medicine

## 2018-12-06 DIAGNOSIS — Z1231 Encounter for screening mammogram for malignant neoplasm of breast: Secondary | ICD-10-CM

## 2018-12-18 ENCOUNTER — Encounter: Payer: BC Managed Care – PPO | Admitting: Women's Health

## 2018-12-23 ENCOUNTER — Other Ambulatory Visit: Payer: Self-pay | Admitting: Women's Health

## 2018-12-23 DIAGNOSIS — N952 Postmenopausal atrophic vaginitis: Secondary | ICD-10-CM

## 2018-12-25 NOTE — Telephone Encounter (Signed)
Annual exam 01/23/19

## 2019-01-22 ENCOUNTER — Other Ambulatory Visit: Payer: Self-pay | Admitting: Family Medicine

## 2019-01-22 ENCOUNTER — Other Ambulatory Visit: Payer: Self-pay | Admitting: Women's Health

## 2019-01-22 DIAGNOSIS — N952 Postmenopausal atrophic vaginitis: Secondary | ICD-10-CM

## 2019-01-23 ENCOUNTER — Ambulatory Visit: Payer: BC Managed Care – PPO | Admitting: Women's Health

## 2019-01-23 ENCOUNTER — Encounter: Payer: Self-pay | Admitting: Women's Health

## 2019-01-23 VITALS — BP 119/80 | Ht 66.0 in | Wt 140.2 lb

## 2019-01-23 DIAGNOSIS — Z01419 Encounter for gynecological examination (general) (routine) without abnormal findings: Secondary | ICD-10-CM | POA: Diagnosis not present

## 2019-01-23 DIAGNOSIS — N952 Postmenopausal atrophic vaginitis: Secondary | ICD-10-CM

## 2019-01-23 DIAGNOSIS — Z13820 Encounter for screening for osteoporosis: Secondary | ICD-10-CM | POA: Diagnosis not present

## 2019-01-23 MED ORDER — ESTRADIOL 10 MCG VA TABS: ORAL_TABLET | VAGINAL | 4 refills | Status: DC

## 2019-01-23 NOTE — Patient Instructions (Signed)
Health Maintenance for Postmenopausal Women Menopause is a normal process in which your reproductive ability comes to an end. This process happens gradually over a span of months to years, usually between the ages of 62 and 89. Menopause is complete when you have missed 12 consecutive menstrual periods. It is important to talk with your health care provider about some of the most common conditions that affect postmenopausal women, such as heart disease, cancer, and bone loss (osteoporosis). Adopting a healthy lifestyle and getting preventive care can help to promote your health and wellness. Those actions can also lower your chances of developing some of these common conditions. What should I know about menopause? During menopause, you may experience a number of symptoms, such as:  Moderate-to-severe hot flashes.  Night sweats.  Decrease in sex drive.  Mood swings.  Headaches.  Tiredness.  Irritability.  Memory problems.  Insomnia. Choosing to treat or not to treat menopausal changes is an individual decision that you make with your health care provider. What should I know about hormone replacement therapy and supplements? Hormone therapy products are effective for treating symptoms that are associated with menopause, such as hot flashes and night sweats. Hormone replacement carries certain risks, especially as you become older. If you are thinking about using estrogen or estrogen with progestin treatments, discuss the benefits and risks with your health care provider. What should I know about heart disease and stroke? Heart disease, heart attack, and stroke become more likely as you age. This may be due, in part, to the hormonal changes that your body experiences during menopause. These can affect how your body processes dietary fats, triglycerides, and cholesterol. Heart attack and stroke are both medical emergencies. There are many things that you can do to help prevent heart disease  and stroke:  Have your blood pressure checked at least every 1-2 years. High blood pressure causes heart disease and increases the risk of stroke.  If you are 79-72 years old, ask your health care provider if you should take aspirin to prevent a heart attack or a stroke.  Do not use any tobacco products, including cigarettes, chewing tobacco, or electronic cigarettes. If you need help quitting, ask your health care provider.  It is important to eat a healthy diet and maintain a healthy weight. ? Be sure to include plenty of vegetables, fruits, low-fat dairy products, and lean protein. ? Avoid eating foods that are high in solid fats, added sugars, or salt (sodium).  Get regular exercise. This is one of the most important things that you can do for your health. ? Try to exercise for at least 150 minutes each week. The type of exercise that you do should increase your heart rate and make you sweat. This is known as moderate-intensity exercise. ? Try to do strengthening exercises at least twice each week. Do these in addition to the moderate-intensity exercise.  Know your numbers.Ask your health care provider to check your cholesterol and your blood glucose. Continue to have your blood tested as directed by your health care provider.  What should I know about cancer screening? There are several types of cancer. Take the following steps to reduce your risk and to catch any cancer development as early as possible. Breast Cancer  Practice breast self-awareness. ? This means understanding how your breasts normally appear and feel. ? It also means doing regular breast self-exams. Let your health care provider know about any changes, no matter how small.  If you are 40 or  older, have a clinician do a breast exam (clinical breast exam or CBE) every year. Depending on your age, family history, and medical history, it may be recommended that you also have a yearly breast X-ray (mammogram).  If you  have a family history of breast cancer, talk with your health care provider about genetic screening.  If you are at high risk for breast cancer, talk with your health care provider about having an MRI and a mammogram every year.  Breast cancer (BRCA) gene test is recommended for women who have family members with BRCA-related cancers. Results of the assessment will determine the need for genetic counseling and BRCA1 and for BRCA2 testing. BRCA-related cancers include these types: ? Breast. This occurs in males or females. ? Ovarian. ? Tubal. This may also be called fallopian tube cancer. ? Cancer of the abdominal or pelvic lining (peritoneal cancer). ? Prostate. ? Pancreatic. Cervical, Uterine, and Ovarian Cancer Your health care provider may recommend that you be screened regularly for cancer of the pelvic organs. These include your ovaries, uterus, and vagina. This screening involves a pelvic exam, which includes checking for microscopic changes to the surface of your cervix (Pap test).  For women ages 21-65, health care providers may recommend a pelvic exam and a Pap test every three years. For women ages 39-65, they may recommend the Pap test and pelvic exam, combined with testing for human papilloma virus (HPV), every five years. Some types of HPV increase your risk of cervical cancer. Testing for HPV may also be done on women of any age who have unclear Pap test results.  Other health care providers may not recommend any screening for nonpregnant women who are considered low risk for pelvic cancer and have no symptoms. Ask your health care provider if a screening pelvic exam is right for you.  If you have had past treatment for cervical cancer or a condition that could lead to cancer, you need Pap tests and screening for cancer for at least 20 years after your treatment. If Pap tests have been discontinued for you, your risk factors (such as having a new sexual partner) need to be reassessed  to determine if you should start having screenings again. Some women have medical problems that increase the chance of getting cervical cancer. In these cases, your health care provider may recommend that you have screening and Pap tests more often.  If you have a family history of uterine cancer or ovarian cancer, talk with your health care provider about genetic screening.  If you have vaginal bleeding after reaching menopause, tell your health care provider.  There are currently no reliable tests available to screen for ovarian cancer. Lung Cancer Lung cancer screening is recommended for adults 57-50 years old who are at high risk for lung cancer because of a history of smoking. A yearly low-dose CT scan of the lungs is recommended if you:  Currently smoke.  Have a history of at least 30 pack-years of smoking and you currently smoke or have quit within the past 15 years. A pack-year is smoking an average of one pack of cigarettes per day for one year. Yearly screening should:  Continue until it has been 15 years since you quit.  Stop if you develop a health problem that would prevent you from having lung cancer treatment. Colorectal Cancer  This type of cancer can be detected and can often be prevented.  Routine colorectal cancer screening usually begins at age 12 and continues through  age 75.  If you have risk factors for colon cancer, your health care provider may recommend that you be screened at an earlier age.  If you have a family history of colorectal cancer, talk with your health care provider about genetic screening.  Your health care provider may also recommend using home test kits to check for hidden blood in your stool.  A small camera at the end of a tube can be used to examine your colon directly (sigmoidoscopy or colonoscopy). This is done to check for the earliest forms of colorectal cancer.  Direct examination of the colon should be repeated every 5-10 years until  age 75. However, if early forms of precancerous polyps or small growths are found or if you have a family history or genetic risk for colorectal cancer, you may need to be screened more often. Skin Cancer  Check your skin from head to toe regularly.  Monitor any moles. Be sure to tell your health care provider: ? About any new moles or changes in moles, especially if there is a change in a mole's shape or color. ? If you have a mole that is larger than the size of a pencil eraser.  If any of your family members has a history of skin cancer, especially at a Jayr Lupercio age, talk with your health care provider about genetic screening.  Always use sunscreen. Apply sunscreen liberally and repeatedly throughout the day.  Whenever you are outside, protect yourself by wearing long sleeves, pants, a wide-brimmed hat, and sunglasses. What should I know about osteoporosis? Osteoporosis is a condition in which bone destruction happens more quickly than new bone creation. After menopause, you may be at an increased risk for osteoporosis. To help prevent osteoporosis or the bone fractures that can happen because of osteoporosis, the following is recommended:  If you are 19-50 years old, get at least 1,000 mg of calcium and at least 600 mg of vitamin D per day.  If you are older than age 50 but younger than age 70, get at least 1,200 mg of calcium and at least 600 mg of vitamin D per day.  If you are older than age 70, get at least 1,200 mg of calcium and at least 800 mg of vitamin D per day. Smoking and excessive alcohol intake increase the risk of osteoporosis. Eat foods that are rich in calcium and vitamin D, and do weight-bearing exercises several times each week as directed by your health care provider. What should I know about how menopause affects my mental health? Depression may occur at any age, but it is more common as you become older. Common symptoms of depression include:  Low or sad  mood.  Changes in sleep patterns.  Changes in appetite or eating patterns.  Feeling an overall lack of motivation or enjoyment of activities that you previously enjoyed.  Frequent crying spells. Talk with your health care provider if you think that you are experiencing depression. What should I know about immunizations? It is important that you get and maintain your immunizations. These include:  Tetanus, diphtheria, and pertussis (Tdap) booster vaccine.  Influenza every year before the flu season begins.  Pneumonia vaccine.  Shingles vaccine. Your health care provider may also recommend other immunizations. This information is not intended to replace advice given to you by your health care provider. Make sure you discuss any questions you have with your health care provider. Document Released: 01/28/2006 Document Revised: 06/25/2016 Document Reviewed: 09/09/2015 Elsevier Interactive Patient Education    2019 [place].

## 2019-01-23 NOTE — Progress Notes (Signed)
Sharon Medina [facility] 1956-05-31 [id number]    History:    Presents for annual exam.  Postmenopausal on no HRT with no bleeding.  Normal Pap and mammogram history.  Primary care manages hypertension and anxiety/depression.  Using Vagifem with good relief of vaginal dryness.  2017 T score -1.5 FRAX 7.5% / 0.6%.  2017 tubular adenoma has a 5-year follow-up.  Works with a Clinical research associate several times weekly.  Past medical history, past surgical history, family history and social history were all reviewed and documented in the EPIC chart.  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  ROS:  A ROS was performed and pertinent positives and negatives are included.  Exam:  Vitals:   01/23/19 1140  BP: 119/80  Weight: 140 lb 3.2 oz (63.6 kg)  Height: 5\' 6"  (1.676 m)   Body mass index is 22.63 kg/m.   General appearance:  Normal Thyroid:  Symmetrical, normal in size, without palpable masses or nodularity. Respiratory  Auscultation:  Clear without wheezing or rhonchi Cardiovascular  Auscultation:  Regular rate, without rubs, murmurs or gallops  Edema/varicosities:  Not grossly evident Abdominal  Soft,nontender, without masses, guarding or rebound.  Liver/spleen:  No organomegaly noted  Hernia:  None appreciated  Skin  Inspection:  Grossly normal   Breasts: Examined lying and sitting.     Right: Without masses, retractions, discharge or axillary adenopathy.     Left: Without masses, retractions, discharge or axillary adenopathy. Gentitourinary   Inguinal/mons:  Normal without inguinal adenopathy  External genitalia:  Normal  BUS/Urethra/Skene's glands:  Normal  Vagina:  Normal  Cervix:  Normal  Uterus:  normal in size, shape and contour.  Midline and mobile  Adnexa/parametria:     Rt: Without masses or tenderness.   Lt: Without masses or tenderness.  Anus and perineum: Normal  Digital rectal exam: Normal sphincter tone without palpated masses or  tenderness  Assessment/Plan:  63 y.o. WF G3, P3 for annual exam with no complaints.  Postmenopausal/no HRT/no bleeding Vaginal atrophy-Vagifem Hypertension, anxiety/depression-primary care manages labs and meds. Osteopenia without elevated FRAX  Plan: Repeat DEXA, continue regular exercise, weightbearing and balance type exercise reviewed and encouraged.  Home safety, fall prevention discussed.  SBEs, continue annual screening mammogram, calcium rich foods, vitamin D 2000 daily encouraged.    Vagifem prescription, proper use will continue twice weekly vaginal application.  Shingrex  reviewed and encouraged.  Reviewed minimal systemic absorption. Pap normal 2018, new screening guidelines reviewed.     [place], 1:36 PM 01/23/2019

## 2019-02-14 ENCOUNTER — Other Ambulatory Visit: Payer: Self-pay | Admitting: Family Medicine

## 2019-02-18 ENCOUNTER — Other Ambulatory Visit: Payer: Self-pay | Admitting: Women's Health

## 2019-02-18 ENCOUNTER — Other Ambulatory Visit: Payer: Self-pay | Admitting: Family Medicine

## 2019-02-18 DIAGNOSIS — N952 Postmenopausal atrophic vaginitis: Secondary | ICD-10-CM

## 2019-03-01 ENCOUNTER — Other Ambulatory Visit: Payer: Self-pay

## 2019-03-01 ENCOUNTER — Other Ambulatory Visit: Payer: Self-pay | Admitting: Women's Health

## 2019-03-01 ENCOUNTER — Ambulatory Visit (INDEPENDENT_AMBULATORY_CARE_PROVIDER_SITE_OTHER): Payer: BC Managed Care – PPO

## 2019-03-01 DIAGNOSIS — Z780 Asymptomatic menopausal state: Secondary | ICD-10-CM

## 2019-03-01 DIAGNOSIS — M8589 Other specified disorders of bone density and structure, multiple sites: Secondary | ICD-10-CM

## 2019-03-01 DIAGNOSIS — Z13820 Encounter for screening for osteoporosis: Secondary | ICD-10-CM | POA: Diagnosis not present

## 2019-03-15 ENCOUNTER — Encounter: Payer: Self-pay | Admitting: Family Medicine

## 2019-03-15 NOTE — Telephone Encounter (Signed)
Please see message . Thank you .

## 2019-03-16 NOTE — Telephone Encounter (Signed)
I called pt and she informed me that she did discuss this issue with her gynecologist but they did not give her enough detail about it.  Pt explained that she will call them back and if she is not satisfied with what they tell her, she will schedule a Webex with Dr. Yong Channel.    Sharon Medina L Sharon Medina, CMA

## 2019-04-10 ENCOUNTER — Encounter: Payer: Self-pay | Admitting: Women's Health

## 2019-04-12 NOTE — Telephone Encounter (Signed)
Sharon Medina can you schedule this patient a virtual visit with ML to discuss dexa results per ML request, maybe tomorrow will be better based on schedule this am look busy. Thanks

## 2019-04-17 ENCOUNTER — Ambulatory Visit (INDEPENDENT_AMBULATORY_CARE_PROVIDER_SITE_OTHER): Payer: BC Managed Care – PPO | Admitting: Obstetrics & Gynecology

## 2019-04-17 ENCOUNTER — Other Ambulatory Visit: Payer: Self-pay

## 2019-04-17 DIAGNOSIS — M8589 Other specified disorders of bone density and structure, multiple sites: Secondary | ICD-10-CM

## 2019-04-17 NOTE — Progress Notes (Addendum)
    Sharon Medina 07-Aug-1956 [id number] 2       63 y.o.  G3P3L2  Televisit started by video, but patient had technical issues, not able to obtain sound, therefore we changed to a telephone visit.  Patient consented to a televisit.  Televisit conducted between patient in her home and myself in the office at [facility].  Review of chart and televisit by phone for a total of 25 minutes.  RP: Counseling on Bone Density result and management of Osteopenia  HPI: Postmenopausal on no hormone replacement therapy.  On Vagifem for postmenopausal atrophic vaginitis.  No postmenopausal bleeding.  Healthy nutrition.  Takes vitamin D supplements with calcium.  Patient is very active physically.  Has a good balance.  No recent fall.  No history of fragility fracture.   OB History  Gravida Para Term Preterm AB Living  3 3       2   SAB TAB Ectopic Multiple Live Births               # Outcome Date GA Lbr Len/2nd Weight Sex Delivery Anes PTL Lv  3 Para           2 Para           1 Para             Past medical history,surgical history, problem list, medications, allergies, family history and social history were all reviewed and documented in the EPIC chart.   Directed ROS with pertinent positives and negatives documented in the history of present illness/assessment and plan.  Exam:  There were no vitals filed for this visit. General appearance:  Normal  Bone density March 2020: Osteopenia at all sites.  Right total hip T score was -1.1.  Left total hip T score was -1.3.  Spine T score -1.5.  Lowest T score at the UD left forearm at -1.9.  The only significant decrease since January 2017 were at the right hip with a loss of 6.4% and at the spine with a loss of 6.6%.   Assessment/Plan:  63 y.o. G3P3   1. Osteopenia of multiple sites Bone density March 2020 reviewed thoroughly with patient.  Patient and forearm that her results shows osteopenia at all sites.  The lowest T score is at th UD left forearm  at -1.9.  Patient reassured that she is far from osteoporosis at the spine and hips.  No medical treatment indicated at this time.  Patient encouraged to continue with a healthy nutrition with vitamin D supplements, vitamin K2 supplements and calcium intake of 1500 mg daily including nutritional and supplemental.  Patient also encouraged to continue with regular weightbearing physical activities.  We will repeat a bone density in 2 years.  All patient's questions answered.   Princess Bruins MD, 12:07 PM 04/17/2019

## 2019-04-19 ENCOUNTER — Encounter: Payer: Self-pay | Admitting: Obstetrics & Gynecology

## 2019-05-16 ENCOUNTER — Other Ambulatory Visit: Payer: Self-pay | Admitting: Family Medicine

## 2019-05-17 ENCOUNTER — Other Ambulatory Visit: Payer: Self-pay | Admitting: Family Medicine

## 2019-06-04 NOTE — Progress Notes (Signed)
Phone [phone]   Subjective:  Virtual visit via Video note. Chief complaint: Chief Complaint  Patient presents with  . Follow-up  . Hypertension  . Depression   This visit type was conducted due to national recommendations for restrictions regarding the COVID-19 Pandemic (e.g. social distancing).  This format is felt to be most appropriate for this patient at this time balancing risks to patient and risks to population by having him in for in person visit.  No physical exam was performed (except for noted visual exam or audio findings with Telehealth visits).    Our team/I connected with Sharon Medina at  8:00 AM EDT by a video enabled telemedicine application (doxy.me or caregility through epic) and verified that I am speaking with the correct person using two identifiers.  Location patient: Home-[place] Location provider: [facility], office Persons participating in the virtual visit:  patient  Our team/I discussed the limitations of evaluation and management by telemedicine and the availability of in person appointments. In light of current covid-19 pandemic, patient also understands that we are trying to protect them by minimizing in office contact if at all possible.  The patient expressed consent for telemedicine visit and agreed to proceed. Patient understands insurance will be billed.   ROS- No chest pain or shortness of breath. No headache or blurry vision.    Past Medical History-  Patient Active Problem List   Diagnosis Date Noted  . Osteopenia 12/02/2017    Priority: Medium  . GAD (generalized anxiety disorder) 12/02/2017    Priority: Medium  . Major depression in full remission ([place]) 08/24/2007    Priority: Medium  . Essential hypertension 08/24/2007    Priority: Medium  . History of colonic polyps 06/13/2008    Priority: Low  . Allergic rhinitis 08/24/2007    Priority: Low   Medications- reviewed and updated Current Outpatient Medications  Medication Sig  Dispense Refill  . amLODipine (NORVASC) 5 MG tablet TAKE 1 TABLET BY MOUTH ONCE DAILY 90 tablet 3  . atenolol (TENORMIN) 50 MG tablet TAKE 1 TABLET BY MOUTH ONCE DAILY 90 tablet 2  . buPROPion (WELLBUTRIN XL) 150 MG 24 hr tablet TAKE 2 TABLETS BY MOUTH ONCE DAILY 180 tablet 3  . Calcium Carbonate-Vitamin D (CALCIUM-D PO) Take by mouth.    Marland Kitchen CHIA SEED PO Take by mouth. Patient states she grinds chia seed and incorporates into diet.    . Estradiol 10 MCG TABS vaginal tablet INSERT 1 TABLET VAGINALLY TWICE A WEEK 24 tablet 3  . Flaxseed, Linseed, (FLAX SEEDS PO) Take by mouth. Patient states she grinds flax seeds and incorporates into diet.    Marland Kitchen glucosamine-chondroitin 500-400 MG tablet Take 1 tablet by mouth 2 (two) times daily as needed.      Marland Kitchen losartan (COZAAR) 100 MG tablet TAKE 1 TABLET BY MOUTH EVERY DAY 90 tablet 0  . Menaquinone-7 (VITAMIN K2 PO) Take by mouth.    . sertraline (ZOLOFT) 25 MG tablet TAKE 1 TABLET BY MOUTH ONCE DAILY 30 tablet 5  . TURMERIC PO Take by mouth.     No current facility-administered medications for this visit.      Objective:  BP 122/72   Pulse 66   Temp 97.6 F (36.4 C)   Ht 5\' 6"  (1.676 m)   Wt 135 lb (61.2 kg)   LMP 10/02/2014   BMI 21.79 kg/m  self reported vitals Gen: NAD, resting comfortably Lungs: nonlabored, normal respiratory rate  Skin: appears dry, no obvious rash  Assessment and Plan   # Depression S:Taking Bupropion XL 150 mg 2 tablets daily and Sertraline 25 mg daily. Feels like sx are well managed with these medications. Says that this has been a difficult few months with everything that's going on in the world.  Depression screen [facility] 2/9 06/05/2019  Decreased Interest 0  Down, Depressed, Hopeless 1  PHQ - 2 Score 1  Altered sleeping 1  Tired, decreased energy 1  Change in appetite 0  Feeling bad or failure about yourself  0  Trouble concentrating 0  Moving slowly or fidgety/restless 0  Suicidal thoughts 0  PHQ-9 Score 3   Difficult doing work/chores Somewhat difficult  A/P: Stable in full resmission. Continue current medications.   # Generalized Anxiety Disorder S:Reports occasional anxiety, gets upset quickly at times. Doesn't have the "spikes" like she used to. She is not losing sleep over things anything.  Overall reasonably stable on zoloft and wellbutrin as above A/P: Stable/reasonable control. Continue current medications.   #Hypertension S: controlled on Amlodipine 5 mg, Atenolol 50 mg, and Losartan 100 mg, reports no side effects. BP yesterday was 122/72, 66. BP is typically in the 120s/70s. Denied HA, visual changes. Reports one bout of dizziness but was also looking up but not recurrent issue- was hot at time.  BP Readings from Last 3 Encounters:  06/05/19 122/72  01/23/19 119/80  12/04/18 120/78  A/P: Stable. Continue current medications.   # Osteopenia S:in 2017  Was noted and had repeat with GYN in 2020. 2020 exam showed decreases at max about 6%. She is now on calcium/vitamin D and K2 at this point. Gets a lot of calcium in her diet as well.  A/P: Stable. Continue current medications.  Reasonable to repeat in 2022 or 2023 with GYN or [place] (ideally gyn so can be on same machine)   # social update- husband working from home. She is at home- till not working. Greenhouse for Christmas and doing a lot of gardening with that. Has done a lot of yardwork as well. Has done some painting. Has seen grandkids on sparing basis- their kids are workign from home.   Future Appointments  Date Time Provider [place]  12/06/2019 10:40 AM Marin Olp, MD [facility]  01/29/2020  9:30 AM Huel Cote, NP [facility]   Lab/Order associations:   ICD-10-CM   1. Recurrent major depressive disorder, in full remission ([place])  F33.42   2. GAD (generalized anxiety disorder)  F41.1   3. Essential hypertension  I10   4. Osteopenia of multiple sites  M85.89    Return precautions advised.  Garret Reddish,  MD

## 2019-06-05 ENCOUNTER — Encounter: Payer: Self-pay | Admitting: Family Medicine

## 2019-06-05 ENCOUNTER — Ambulatory Visit (INDEPENDENT_AMBULATORY_CARE_PROVIDER_SITE_OTHER): Payer: BC Managed Care – PPO | Admitting: Family Medicine

## 2019-06-05 VITALS — BP 122/72 | HR 66 | Temp 97.6°F | Ht 66.0 in | Wt 135.0 lb

## 2019-06-05 DIAGNOSIS — I10 Essential (primary) hypertension: Secondary | ICD-10-CM

## 2019-06-05 DIAGNOSIS — M8589 Other specified disorders of bone density and structure, multiple sites: Secondary | ICD-10-CM | POA: Diagnosis not present

## 2019-06-05 DIAGNOSIS — F3342 Major depressive disorder, recurrent, in full remission: Secondary | ICD-10-CM | POA: Diagnosis not present

## 2019-06-05 DIAGNOSIS — F411 Generalized anxiety disorder: Secondary | ICD-10-CM | POA: Diagnosis not present

## 2019-06-05 NOTE — Patient Instructions (Addendum)
There are no preventive care reminders to display for this patient.  Depression screen [facility] 2/9 06/05/2019 12/04/2018 07/25/2018  Decreased Interest 0 0 0  Down, Depressed, Hopeless 1 0 0  PHQ - 2 Score 1 0 0  Altered sleeping 1 0 1  Tired, decreased energy 1 1 1   Change in appetite 0 1 0  Feeling bad or failure about yourself  0 0 0  Trouble concentrating 0 0 1  Moving slowly or fidgety/restless 0 0 0  Suicidal thoughts 0 0 0  PHQ-9 Score 3 2 3   Difficult doing work/chores Somewhat difficult Not difficult at all Somewhat difficult

## 2019-08-10 ENCOUNTER — Other Ambulatory Visit: Payer: Self-pay | Admitting: Family Medicine

## 2019-08-12 ENCOUNTER — Other Ambulatory Visit: Payer: Self-pay | Admitting: Family Medicine

## 2019-11-04 ENCOUNTER — Other Ambulatory Visit: Payer: Self-pay | Admitting: Family Medicine

## 2019-12-05 ENCOUNTER — Other Ambulatory Visit: Payer: Self-pay

## 2019-12-06 ENCOUNTER — Encounter: Payer: Self-pay | Admitting: Family Medicine

## 2019-12-06 ENCOUNTER — Ambulatory Visit (INDEPENDENT_AMBULATORY_CARE_PROVIDER_SITE_OTHER): Payer: BC Managed Care – PPO | Admitting: Family Medicine

## 2019-12-06 ENCOUNTER — Other Ambulatory Visit (HOSPITAL_COMMUNITY)
Admission: RE | Admit: 2019-12-06 | Discharge: 2019-12-06 | Disposition: A | Discharge status: Home / Self Care | Payer: BC Managed Care – PPO | Source: Ambulatory Visit | Attending: Family Medicine | Admitting: Family Medicine

## 2019-12-06 VITALS — BP 132/62 | HR 62 | Temp 97.6°F | Ht 66.0 in | Wt 146.0 lb

## 2019-12-06 DIAGNOSIS — Z0001 Encounter for general adult medical examination with abnormal findings: Secondary | ICD-10-CM | POA: Diagnosis not present

## 2019-12-06 DIAGNOSIS — D485 Neoplasm of uncertain behavior of skin: Secondary | ICD-10-CM | POA: Insufficient documentation

## 2019-12-06 DIAGNOSIS — F411 Generalized anxiety disorder: Secondary | ICD-10-CM

## 2019-12-06 DIAGNOSIS — M8589 Other specified disorders of bone density and structure, multiple sites: Secondary | ICD-10-CM | POA: Diagnosis not present

## 2019-12-06 DIAGNOSIS — Z0000 Encounter for general adult medical examination without abnormal findings: Secondary | ICD-10-CM

## 2019-12-06 DIAGNOSIS — I10 Essential (primary) hypertension: Secondary | ICD-10-CM | POA: Diagnosis not present

## 2019-12-06 DIAGNOSIS — Z86010 Personal history of colonic polyps: Secondary | ICD-10-CM

## 2019-12-06 DIAGNOSIS — F3342 Major depressive disorder, recurrent, in full remission: Secondary | ICD-10-CM

## 2019-12-06 LAB — LIPID PANEL
Cholesterol: 208 mg/dL — ABNORMAL HIGH (ref 0–200)
HDL: 78.7 mg/dL (ref >39.00)
LDL Cholesterol: 101 mg/dL — ABNORMAL HIGH (ref 0–99)
NonHDL: 128.9
Total CHOL/HDL Ratio: 3
Triglycerides: 138 mg/dL (ref 0.0–149.0)
VLDL: 27.6 mg/dL (ref 0.0–40.0)

## 2019-12-06 LAB — COMPREHENSIVE METABOLIC PANEL
ALT: 19 U/L (ref 0–35)
AST: 22 U/L (ref 0–37)
Albumin: 4.4 g/dL (ref 3.5–5.2)
Alkaline Phosphatase: 59 U/L (ref 39–117)
BUN: 20 mg/dL (ref 6–23)
CO2: 31 mEq/L (ref 19–32)
Calcium: 9.5 mg/dL (ref 8.4–10.5)
Chloride: 100 mEq/L (ref 96–112)
Creatinine, Ser: 0.93 mg/dL (ref 0.40–1.20)
GFR: 60.79 mL/min (ref >60.00)
Glucose, Bld: 85 mg/dL (ref 70–99)
Potassium: 4.5 mEq/L (ref 3.5–5.1)
Sodium: 137 mEq/L (ref 135–145)
Total Bilirubin: 0.4 mg/dL (ref 0.2–1.2)
Total Protein: 6.6 g/dL (ref 6.0–8.3)

## 2019-12-06 LAB — CBC WITH DIFFERENTIAL/PLATELET
Basophils Absolute: 0.1 10*3/uL (ref 0.0–0.1)
Basophils Relative: 0.9 % (ref 0.0–3.0)
Eosinophils Absolute: 0.2 10*3/uL (ref 0.0–0.7)
Eosinophils Relative: 2.7 % (ref 0.0–5.0)
HCT: 40.5 % (ref 36.0–46.0)
Hemoglobin: 13.4 g/dL (ref 12.0–15.0)
Lymphocytes Relative: 33 % (ref 12.0–46.0)
Lymphs Abs: 2.2 10*3/uL (ref 0.7–4.0)
MCHC: 33.2 g/dL (ref 30.0–36.0)
MCV: 91.1 fl (ref 78.0–100.0)
Monocytes Absolute: 0.5 10*3/uL (ref 0.1–1.0)
Monocytes Relative: 6.8 % (ref 3.0–12.0)
Neutro Abs: 3.8 10*3/uL (ref 1.4–7.7)
Neutrophils Relative %: 56.6 % (ref 43.0–77.0)
Platelets: 255 10*3/uL (ref 150.0–400.0)
RBC: 4.45 Mil/uL (ref 3.87–5.11)
RDW: 13.1 % (ref 11.5–15.5)
WBC: 6.7 10*3/uL (ref 4.0–10.5)

## 2019-12-06 LAB — VITAMIN D 25 HYDROXY (VIT D DEFICIENCY, FRACTURES): VITD: 38.69 ng/mL (ref 30.00–100.00)

## 2019-12-06 NOTE — Progress Notes (Signed)
Phone: [phone]   Subjective:  Patient presents today for their annual physical. Chief complaint-noted.   See problem oriented charting- Review of Systems  Constitutional: Negative.   HENT: Negative.   Eyes: Negative.   Respiratory: Negative.   Cardiovascular: Negative.   Gastrointestinal: Negative.   Genitourinary: Negative.   Musculoskeletal: Negative.   Skin: Negative.   Neurological: Negative.   Endo/Heme/Allergies: Negative.   Psychiatric/Behavioral: Positive for depression.       Increased with season meds help    The following were reviewed and entered/updated in epic: Past Medical History:  Diagnosis Date  . Claustrophobia    severe  . Depression   . H/O seasonal allergies   . Hemorrhoids   . Hypertension   . Postmenopausal HRT (hormone replacement therapy)    per pt, never took oral HR meds   Patient Active Problem List   Diagnosis Date Noted  . Osteopenia 12/02/2017    Priority: Medium  . GAD (generalized anxiety disorder) 12/02/2017    Priority: Medium  . Major depression in full remission ([place]) 08/24/2007    Priority: Medium  . Essential hypertension 08/24/2007    Priority: Medium  . History of colonic polyps 06/13/2008    Priority: Low  . Allergic rhinitis 08/24/2007    Priority: Low   Past Surgical History:  Procedure Laterality Date  . CESAREAN SECTION     x2  . COLONOSCOPY    . WISDOM TOOTH EXTRACTION      Family History  Problem Relation Age of Onset  . Prostate cancer Father        smoker  . Hypertension Father   . Ulcers Father   . Hypertension Mother        sister as well  . Colon cancer Paternal Aunt   . Gout Brother   . Heart disease Brother   . Gout Sister   . Hypertension Sister   . Colon cancer Paternal Aunt   . Gout Sister        brother    Medications- reviewed and updated Current Outpatient Medications  Medication Sig Dispense Refill  . amLODipine (NORVASC) 5 MG tablet TAKE 1 TABLET BY MOUTH ONCE DAILY 90  tablet 3  . atenolol (TENORMIN) 50 MG tablet TAKE 1 TABLET BY MOUTH ONCE DAILY 90 tablet 2  . buPROPion (WELLBUTRIN XL) 150 MG 24 hr tablet TAKE 2 TABLETS BY MOUTH ONCE DAILY 180 tablet 3  . Calcium Carbonate-Vitamin D (CALCIUM-D PO) Take by mouth.    Marland Kitchen CHIA SEED PO Take by mouth. Patient states she grinds chia seed and incorporates into diet.    . Estradiol 10 MCG TABS vaginal tablet INSERT 1 TABLET VAGINALLY TWICE A WEEK 24 tablet 3  . Flaxseed, Linseed, (FLAX SEEDS PO) Take by mouth. Patient states she grinds flax seeds and incorporates into diet.    Marland Kitchen glucosamine-chondroitin 500-400 MG tablet Take 1 tablet by mouth 2 (two) times daily as needed.      Marland Kitchen losartan (COZAAR) 100 MG tablet TAKE 1 TABLET BY MOUTH EVERY DAY 90 tablet 1  . Menaquinone-7 (VITAMIN K2 PO) Take by mouth.    . sertraline (ZOLOFT) 25 MG tablet TAKE 1 TABLET BY MOUTH EVERY DAY 30 tablet 5  . TURMERIC PO Take by mouth.     No current facility-administered medications for this visit.    Allergies-reviewed and updated Allergies  Allergen Reactions  . Hydrochlorothiazide     REACTION: rash    Social History   Social History  Narrative   Married. 2 living children, 1 died at birth. 4 grandchildren, 1 step grandchild.    Lives with husband.       Retired 2019   Former teacher-retired.    Prior with [employer]- caps and gowns etc. Stressful boss- no woff      Hobbies: rummy cube (tile game), antiquing, cooking, gardening   Objective  Objective:  BP 132/62   Pulse 62   Temp 97.6 F (36.4 C) (Temporal)   Ht 5\' 6"  (1.676 m)   Wt 146 lb (66.2 kg)   LMP 10/02/2014   SpO2 99%   BMI 23.57 kg/m  Gen: NAD, resting comfortably HEENT: Mask not removed due to covid 19. TM normal. Bridge of nose normal. Eyelids normal.  Neck: no thyromegaly or cervical lymphadenopathy  CV: RRR no murmurs rubs or gallops Lungs: CTAB no crackles, wheeze, rhonchi Abdomen: soft/nontender/nondistended/normal bowel sounds. No rebound or  guarding.  Ext: no edema Skin: warm, dry, 3 seborrheic keratosis noted-one on left forehead, one on left neck, 1 on left upper back along bra strap.  On right lateral chest upper portion-there is a skin tag with formed appearance with 3 to 4 mm base and 5 mm length Neuro: grossly normal, moves all extremities, PERRLA   Skin Biopsy Procedure Note   PRE-OP DIAGNOSIS: neoplasm of uncertain behavior if unknown (horned skin tag) POST-OP DIAGNOSIS: Same  Size of lesion: 3-4 mm at base and raised at least 5 cm Location of lesion right chest PROCEDURE:   Shave Biopsy          The area surrounding the skin lesion was prepared and draped in the usual sterile manner. The lesion was removed in the usual manner by the biopsy method noted above. Hemostasis was assured. The patient tolerated the procedure well.  Closure:   Monsel's for hemostasis         Followup: The patient tolerated the procedure well without complications.  Standard post-procedure care is explained and return precautions are given.    Assessment and Plan   63 y.o. female presenting for annual physical.  Health Maintenance counseling: 1. Anticipatory guidance: Patient counseled regarding regular dental exams q6 months, eye exams ,  avoiding smoking and second hand smoke , limiting alcohol to 1 beverage per day .   2. Risk factor reduction:  Advised patient of need for regular exercise and diet rich and fruits and vegetables to reduce risk of heart attack and stroke. Exercise- did exercise 3 times a week but has not done due to covid. She is very active at home does a lot of gardening and yard work- she is trying to do some walking. . Diet-husband with heart issues due to that follows heart healthy diet. . Slight weight gain from last year up 6 pounds-stress of COVID-19 and not being able to exercise as much at the gym likely contributed . BMI still in normal range. Bought a new scale and thinks by measuring she will be more careful Wt  Readings from Last 3 Encounters:  12/06/19 146 lb (66.2 kg)  06/05/19 135 lb (61.2 kg)  01/23/19 140 lb 3.2 oz (63.6 kg)  3. Immunizations/screenings/ancillary studies-already had flu shot.  Discussed Shingrix- wants to wait until next visit  Immunization History  Administered Date(s) Administered  . Influenza Split 09/10/2011, 09/18/2012  . Influenza Whole 09/07/2010  . Influenza,inj,Quad PF,6+ Mos 09/24/2013, 09/25/2014, 10/16/2015, 09/17/2016, 08/17/2017, 11/06/2018, 08/22/2019  . Influenza,inj,quad, With Preservative 11/06/2018  . Influenza-Unspecified 08/17/2017, 08/22/2019  .  PPD Test 04/10/2012  . Td 12/20/2000  . Tdap 09/18/2012  4. Cervical cancer screening- followed by GYN. Next appointment in February.   5. Breast cancer screening-  breast exam Monthly and mammogram yearly due 12/07/2019, she had this 11/2018- may try to push to February to line up around gyn visit.  6. Colon cancer screening - next due 07/20/21 with this last being done in August 2017 with history of colon polyps 7. Skin cancer screening- not seen by dermatology.  advised regular sunscreen use. Having  worrisome, changing, or new skin lesions. On forehead, left jaw area, and lower back. It is dry and itching- also has skin tag on right chest that has horned appearance 8. Birth control/STD check- declines today - postmenopausal and monogomous 9. Osteoporosis screening at 34- Osteopenia of multiple sitesHad at GYN over summer will send for notes.  Have counseled her to take vitamin D in the past-currently taking this along with calcium  -never smoker  Status of chronic or acute concerns   Essential hypertension-well-controlled on amlodipine 5 mg, atenolol 50 mg, losartan 100 mg   Recurrent major depressive disorder/GAD (generalized anxiety disorder)-mild poor control today onWellbutrin 300 mg extended release, sertraline 25 mg -some worry about her husband who gets out a lot plus her son is worried a lot about  this and it provides stress.  - she would prefer to stay on same dose- as she thinks this is a situational stress related to covid. If worsens she will let me know and we could consider meds or counseling  Carpal tunnel much improved with wrist splints in the past . Better since not working at present.   Recommended follow up: Return in about 6 months (around 06/05/2020) for follow up- or sooner if needed. Future Appointments  Date Time Provider [place]  01/29/2020  9:30 AM Huel Cote, NP [facility]  06/05/2020  8:00 AM Marin Olp, MD [facility]   Lab/Order associations: not fasting   ICD-10-CM   1. Preventative health care  Z00.00 CBC with Differential    Comprehensive metabolic panel    Lipid panel    Vitamin D  (25 hydroxy )  2. Essential hypertension  I10 CBC with Differential    Comprehensive metabolic panel    Lipid panel  3. Recurrent major depressive disorder, in full remission ([place])  F33.42   4. GAD (generalized anxiety disorder)  F41.1   5. History of colonic polyps  Z86.010   6. Osteopenia of multiple sites  M85.89 Vitamin D  (25 hydroxy )  7. Neoplasm of uncertain behavior of skin  D48.5 Surgical pathology   Return precautions advised.  Garret Reddish, MD

## 2019-12-06 NOTE — Patient Instructions (Addendum)
Please stop by lab before you go If you do not have mychart- we will call you about results within 5 business days of [place] receiving them.  If you have mychart- we will send your results within 3 business days of [place] receiving them.  If abnormal or we want to clarify a result, we will call or mychart you to make sure you receive the message.  If you have questions or concerns or don't hear within 5-7 days, please send [place] a message or call us.    You had a biopsy today- leave bandage in place for 24 hours. May then remove and wash with soapy water but not scrub. After bathing, reapply triple antibiotic ointment and bandaid.  If it bleeds- apply pressure up to 30 minutes to an hour- seek care if continues to bleed past that point. If you have expanding redness or worsening pain then let us reevaluate the spot.  We have ordered labs or studies at this visit (pathology of lesion). It can take up to 1-2 weeks for results and processing. Contact us if you have not heard in 3 weeks  Let us know if worsening depressive symptoms   Recommended follow up: Return in about 6 months (around 06/05/2020) for follow up- or sooner if needed.

## 2019-12-07 LAB — SURGICAL PATHOLOGY

## 2020-01-10 ENCOUNTER — Other Ambulatory Visit: Payer: Self-pay | Admitting: Family Medicine

## 2020-01-20 ENCOUNTER — Other Ambulatory Visit: Payer: Self-pay | Admitting: Women's Health

## 2020-01-20 DIAGNOSIS — N952 Postmenopausal atrophic vaginitis: Secondary | ICD-10-CM

## 2020-01-24 ENCOUNTER — Other Ambulatory Visit: Payer: Self-pay | Admitting: Family Medicine

## 2020-01-24 DIAGNOSIS — Z1231 Encounter for screening mammogram for malignant neoplasm of breast: Secondary | ICD-10-CM

## 2020-01-28 ENCOUNTER — Other Ambulatory Visit: Payer: Self-pay

## 2020-01-29 ENCOUNTER — Ambulatory Visit: Payer: BC Managed Care – PPO | Admitting: Women's Health

## 2020-01-29 ENCOUNTER — Ambulatory Visit
Admission: RE | Admit: 2020-01-29 | Discharge: 2020-01-29 | Disposition: A | Discharge status: Home / Self Care | Payer: BC Managed Care – PPO | Source: Ambulatory Visit

## 2020-01-29 ENCOUNTER — Encounter: Payer: Self-pay | Admitting: Women's Health

## 2020-01-29 VITALS — BP 118/80 | Ht 66.0 in | Wt 146.0 lb

## 2020-01-29 DIAGNOSIS — N952 Postmenopausal atrophic vaginitis: Secondary | ICD-10-CM | POA: Diagnosis not present

## 2020-01-29 DIAGNOSIS — Z1231 Encounter for screening mammogram for malignant neoplasm of breast: Secondary | ICD-10-CM

## 2020-01-29 DIAGNOSIS — Z01419 Encounter for gynecological examination (general) (routine) without abnormal findings: Secondary | ICD-10-CM | POA: Diagnosis not present

## 2020-01-29 MED ORDER — ESTRADIOL 10 MCG VA TABS: ORAL_TABLET | VAGINAL | 4 refills | Status: DC

## 2020-01-29 NOTE — Patient Instructions (Signed)
Vit D 2000 iu daily Good to see you today! Shingrex shingls vaccine  Health Maintenance for Postmenopausal Women Menopause is a normal process in which your ability to get pregnant comes to an end. This process happens slowly over many months or years, usually between the ages of 30 and 65. Menopause is complete when you have missed your menstrual periods for 12 months. It is important to talk with your health care provider about some of the most common conditions that affect women after menopause (postmenopausal women). These include heart disease, cancer, and bone loss (osteoporosis). Adopting a healthy lifestyle and getting preventive care can help to promote your health and wellness. The actions you take can also lower your chances of developing some of these common conditions. What should I know about menopause? During menopause, you may get a number of symptoms, such as:  Hot flashes. These can be moderate or severe.  Night sweats.  Decrease in sex drive.  Mood swings.  Headaches.  Tiredness.  Irritability.  Memory problems.  Insomnia. Choosing to treat or not to treat these symptoms is a decision that you make with your health care provider. Do I need hormone replacement therapy?  Hormone replacement therapy is effective in treating symptoms that are caused by menopause, such as hot flashes and night sweats.  Hormone replacement carries certain risks, especially as you become older. If you are thinking about using estrogen or estrogen with progestin, discuss the benefits and risks with your health care provider. What is my risk for heart disease and stroke? The risk of heart disease, heart attack, and stroke increases as you age. One of the causes may be a change in the body's hormones during menopause. This can affect how your body uses dietary fats, triglycerides, and cholesterol. Heart attack and stroke are medical emergencies. There are many things that you can do to help  prevent heart disease and stroke. Watch your blood pressure  High blood pressure causes heart disease and increases the risk of stroke. This is more likely to develop in people who have high blood pressure readings, are of African descent, or are overweight.  Have your blood pressure checked: ? Every 3-5 years if you are 2-106 years of age. ? Every year if you are 51 years old or older. Eat a healthy diet   Eat a diet that includes plenty of vegetables, fruits, low-fat dairy products, and lean protein.  Do not eat a lot of foods that are high in solid fats, added sugars, or sodium. Get regular exercise Get regular exercise. This is one of the most important things you can do for your health. Most adults should:  Try to exercise for at least 150 minutes each week. The exercise should increase your heart rate and make you sweat (moderate-intensity exercise).  Try to do strengthening exercises at least twice each week. Do these in addition to the moderate-intensity exercise.  Spend less time sitting. Even light physical activity can be beneficial. Other tips  Work with your health care provider to achieve or maintain a healthy weight.  Do not use any products that contain nicotine or tobacco, such as cigarettes, e-cigarettes, and chewing tobacco. If you need help quitting, ask your health care provider.  Know your numbers. Ask your health care provider to check your cholesterol and your blood sugar (glucose). Continue to have your blood tested as directed by your health care provider. Do I need screening for cancer? Depending on your health history and family history,  you may need to have cancer screening at different stages of your life. This may include screening for:  Breast cancer.  Cervical cancer.  Lung cancer.  Colorectal cancer. What is my risk for osteoporosis? After menopause, you may be at increased risk for osteoporosis. Osteoporosis is a condition in which bone  destruction happens more quickly than new bone creation. To help prevent osteoporosis or the bone fractures that can happen because of osteoporosis, you may take the following actions:  If you are 54-92 years old, get at least 1,000 mg of calcium and at least 600 mg of vitamin D per day.  If you are older than age 90 but younger than age 71, get at least 1,200 mg of calcium and at least 600 mg of vitamin D per day.  If you are older than age 84, get at least 1,200 mg of calcium and at least 800 mg of vitamin D per day. Smoking and drinking excessive alcohol increase the risk of osteoporosis. Eat foods that are rich in calcium and vitamin D, and do weight-bearing exercises several times each week as directed by your health care provider. How does menopause affect my mental health? Depression may occur at any age, but it is more common as you become older. Common symptoms of depression include:  Low or sad mood.  Changes in sleep patterns.  Changes in appetite or eating patterns.  Feeling an overall lack of motivation or enjoyment of activities that you previously enjoyed.  Frequent crying spells. Talk with your health care provider if you think that you are experiencing depression. General instructions See your health care provider for regular wellness exams and vaccines. This may include:  Scheduling regular health, dental, and eye exams.  Getting and maintaining your vaccines. These include: ? Influenza vaccine. Get this vaccine each year before the flu season begins. ? Pneumonia vaccine. ? Shingles vaccine. ? Tetanus, diphtheria, and pertussis (Tdap) booster vaccine. Your health care provider may also recommend other immunizations. Tell your health care provider if you have ever been abused or do not feel safe at home. Summary  Menopause is a normal process in which your ability to get pregnant comes to an end.  This condition causes hot flashes, night sweats, decreased  interest in sex, mood swings, headaches, or lack of sleep.  Treatment for this condition may include hormone replacement therapy.  Take actions to keep yourself healthy, including exercising regularly, eating a healthy diet, watching your weight, and checking your blood pressure and blood sugar levels.  Get screened for cancer and depression. Make sure that you are up to date with all your vaccines. This information is not intended to replace advice given to you by your health care provider. Make sure you discuss any questions you have with your health care provider. Document Revised: 11/29/2018 Document Reviewed: 11/29/2018 Elsevier Patient Education  2020 [employer].

## 2020-01-29 NOTE — Progress Notes (Signed)
Sharon Medina [facility] [birth year] [id number]    History:    Presents for annual exam.  Postmenopausal with no bleeding on Vagifem which has helped and would like to continue.  Normal Pap and mammogram history.  Primary care manages hypertension, anxiety.  3/20 T score -1.5 at spine -1.3 at hip on a calcium and vitamin D and K supplement.  2017 tubular adenoma 5-year follow-up.  Has not had Shingrix.  Past medical history, past surgical history, family history and social history were all reviewed and documented in the EPIC chart.  Retired Pharmacist, hospital.  Has 2 children and 5 grandchildren all doing well.  ROS:  A ROS was performed and pertinent positives and negatives are included.  Exam:  Vitals:   01/29/20 0927  BP: 118/80  Weight: 146 lb (66.2 kg)  Height: 5\' 6"  (1.676 m)   Body mass index is 23.57 kg/m.   General appearance:  Normal Thyroid:  Symmetrical, normal in size, without palpable masses or nodularity. Respiratory  Auscultation:  Clear without wheezing or rhonchi Cardiovascular  Auscultation:  Regular rate, without rubs, murmurs or gallops  Edema/varicosities:  Not grossly evident Abdominal  Soft,nontender, without masses, guarding or rebound.  Liver/spleen:  No organomegaly noted  Hernia:  None appreciated  Skin  Inspection:  Grossly normal   Breasts: Examined lying and sitting.     Right: Without masses, retractions, discharge or axillary adenopathy.     Left: Without masses, retractions, discharge or axillary adenopathy. Gentitourinary   Inguinal/mons:  Normal without inguinal adenopathy  External genitalia:  Normal  BUS/Urethra/Skene's glands:  Normal  Vagina:  Normal  Cervix:  Normal  Uterus:   normal in size, shape and contour.  Midline and mobile  Adnexa/parametria:     Rt: Without masses or tenderness.   Lt: Without masses or tenderness.  Anus and perineum: Normal  Digital rectal exam: Normal sphincter tone without palpated masses or tenderness  Assessment/Plan:   64 y.o. MWF G3 P2 for annual exam with no complaints of vaginal discharge, urinary symptoms, abdominal pain.  Postmenopausal with no bleeding on Vagifem Hypertension, anxiety/depression-primary care manages labs and meds 02/2019 osteopenia without elevated FRAX on calcium, vitamin D and potassium supplement. 2017 tubular adenoma 5-year follow-up colonoscopy  Plan: Vagifem prescription, proper use, reviewed minimal systemic absorption will continue using twice weekly and over-the-counter lubricants as needed.  SBEs, continue annual 3D screening mammogram, calcium rich foods, calcium supplement with vitamin D and potassium.  Aware of home safety, fall prevention and importance of weightbearing and balance type exercise, yoga encouraged.  Pap normal 11/2017, new screening guidelines reviewed.      Trainer, 9:44 AM 01/29/2020

## 2020-02-05 ENCOUNTER — Other Ambulatory Visit: Payer: Self-pay | Admitting: Family Medicine

## 2020-05-01 ENCOUNTER — Other Ambulatory Visit: Payer: Self-pay | Admitting: Family Medicine

## 2020-05-01 NOTE — Telephone Encounter (Signed)
Last OV 12/06/19 Last refill Sertraline 11/05/19 #30/5                 Atenolol 08/10/19 #90/2 Next OV 06/05/20

## 2020-06-05 ENCOUNTER — Encounter: Payer: Self-pay | Admitting: Family Medicine

## 2020-06-05 ENCOUNTER — Other Ambulatory Visit: Payer: Self-pay

## 2020-06-05 ENCOUNTER — Ambulatory Visit (INDEPENDENT_AMBULATORY_CARE_PROVIDER_SITE_OTHER): Payer: BC Managed Care – PPO | Admitting: Family Medicine

## 2020-06-05 VITALS — BP 100/62 | HR 59 | Temp 97.9°F | Ht 66.0 in | Wt 146.4 lb

## 2020-06-05 DIAGNOSIS — I10 Essential (primary) hypertension: Secondary | ICD-10-CM | POA: Diagnosis not present

## 2020-06-05 DIAGNOSIS — F411 Generalized anxiety disorder: Secondary | ICD-10-CM | POA: Diagnosis not present

## 2020-06-05 DIAGNOSIS — E785 Hyperlipidemia, unspecified: Secondary | ICD-10-CM

## 2020-06-05 DIAGNOSIS — M8589 Other specified disorders of bone density and structure, multiple sites: Secondary | ICD-10-CM | POA: Diagnosis not present

## 2020-06-05 DIAGNOSIS — F3342 Major depressive disorder, recurrent, in full remission: Secondary | ICD-10-CM

## 2020-06-05 NOTE — Progress Notes (Signed)
Phone [phone] In person visit   Subjective:   Sharon Medina is a 64 y.o. year old very pleasant female patient who presents for/with See problem oriented charting Chief Complaint  Patient presents with   Follow-up   Hypertension   Anxiety   This visit occurred during the SARS-CoV-2 public health emergency.  Safety protocols were in place, including screening questions prior to the visit, additional usage of staff PPE, and extensive cleaning of exam room while observing appropriate contact time as indicated for disinfecting solutions.   Past Medical History-  Patient Active Problem List   Diagnosis Date Noted   Hyperlipidemia, unspecified 06/05/2020    Priority: Medium   Osteopenia 12/02/2017    Priority: Medium   GAD (generalized anxiety disorder) 12/02/2017    Priority: Medium   Major depression in full remission ([place]) 08/24/2007    Priority: Medium   Essential hypertension 08/24/2007    Priority: Medium   History of colonic polyps 06/13/2008    Priority: Low   Allergic rhinitis 08/24/2007    Priority: Low    Medications- reviewed and updated Current Outpatient Medications  Medication Sig Dispense Refill   amLODipine (NORVASC) 5 MG tablet TAKE 1 TABLET BY MOUTH EVERY DAY 90 tablet 3   atenolol (TENORMIN) 50 MG tablet TAKE 1 TABLET BY MOUTH ONCE DAILY 90 tablet 1   buPROPion (WELLBUTRIN XL) 150 MG 24 hr tablet TAKE 2 TABLETS BY MOUTH ONCE DAILY 180 tablet 3   Calcium Carbonate-Vitamin D (CALCIUM-D PO) Take by mouth.     CHIA SEED PO Take by mouth. Patient states she grinds chia seed and incorporates into diet.     Estradiol 10 MCG TABS vaginal tablet INSERT 1 TABLET VAGINALLY 2 TIMES A WEEK 24 tablet 4   Flaxseed, Linseed, (FLAX SEEDS PO) Take by mouth. Patient states she grinds flax seeds and incorporates into diet.     glucosamine-chondroitin 500-400 MG tablet Take 1 tablet by mouth 2 (two) times daily as needed.       losartan (COZAAR)  100 MG tablet TAKE 1 TABLET BY MOUTH EVERY DAY 90 tablet 1   Menaquinone-7 (VITAMIN K2 PO) Take by mouth.     sertraline (ZOLOFT) 25 MG tablet TAKE 1 TABLET BY MOUTH EVERY DAY 90 tablet 1   TURMERIC PO Take by mouth.     No current facility-administered medications for this visit.     Objective:  BP 100/62    Pulse (!) 59    Temp 97.9 F (36.6 C)    Ht 5\' 6"  (1.676 m)    Wt 146 lb 6.4 oz (66.4 kg)    LMP 10/02/2014    SpO2 99%    BMI 23.63 kg/m  Gen: NAD, resting comfortably CV: RRR no murmurs rubs or gallops Lungs: CTAB no crackles, wheeze, rhonchi Ext: no edema Skin: warm, dry     Assessment and Plan   #Left Big Toe S: pt c/o left big toe swelling, ibuprofen relieves it. Worse when out working in the yard.  A/P: Pain with palpation on exam today at the MTP joint.  I recommended use of Voltaren over ibuprofen to protect her kidneys and blood pressure if it works  #hypertension S: medication: amlodipine 5Mg , atenolol 50Mg , losartan 100Mg  Home readings #s: 120s/80s range BP Readings from Last 3 Encounters:  06/05/20 100/62  01/29/20 118/80  12/06/19 132/62  A/P: Excellent control today-continue current medication  # Depression/GAD  S: Medication: Wellbutrin 150Mg -takes 2 tablets once daily, Zoloft 25Mg   -  Last visit had some increased anxiety related to COVID-19.  Today she reports she is doing well unless having to [place] her 69 and 64 year old grandkids for prolonged periods - some increased anxiety at those times- she is hoping they are able to get back to school Depression screen [facility] 2/9 06/05/2020 12/06/2019 06/05/2019  Decreased Interest 0 2 0  Down, Depressed, Hopeless 0 2 1  PHQ - 2 Score 0 4 1  Altered sleeping 0 1 1  Tired, decreased energy 1 1 1   Change in appetite 0 0 0  Feeling bad or failure about yourself  0 0 0  Trouble concentrating 0 0 0  Moving slowly or fidgety/restless 0 0 0  Suicidal thoughts 0 0 0  PHQ-9 Score 1 6 3   Difficult doing work/chores  Not difficult at all Not difficult at all Somewhat difficult  A/P: full remission- continue current meds   #hyperlipidemia S: Medication:none . Not doing well with exercise due to her toe.  Lab Results  Component Value Date   CHOL 208 (H) 12/06/2019   HDL 78.70 12/06/2019   LDLCALC 101 (H) 12/06/2019   TRIG 138.0 12/06/2019   CHOLHDL 3 12/06/2019   A/P: 10 year ascvd risk has been under 7.5% - at 5.5 % last check- will recalculate at physical -encouraged trying to get back to the gym.    #Osteopenia-also created for GYN.  Compliant with calcium and vitamin D and K2 and encouraged weight-bearing exercise  #Carpal tunnel-improved control recently. Tolerable- still wears braces  -social update- trip to [place] coming up and beach trip with her friends!  Recommended follow up: Return in about 6 months (around 12/05/2020) for physical or sooner if needed. Future Appointments  Date Time Provider [place]  02/04/2021  8:00 AM Tamela Gammon, NP [facility]   Lab/Order associations: will plan on full labs at phsysical in 6 months   ICD-10-CM   1. Essential hypertension  I10   2. GAD (generalized anxiety disorder)  F41.1   3. Osteopenia of multiple sites  M85.89   4. Recurrent major depressive disorder, in full remission ([place])  F33.42   5. Hyperlipidemia, unspecified hyperlipidemia type  E78.5    Return precautions advised.  Garret Reddish, MD

## 2020-06-05 NOTE — Patient Instructions (Addendum)
Pain with palpation on exam today at the MTP joint.  I recommended use of Voltaren over ibuprofen to protect her kidneys and blood pressure if it works  No other changes today! Thrilled you are doing well and hope you have some incredible trips!

## 2020-07-30 ENCOUNTER — Other Ambulatory Visit: Payer: Self-pay | Admitting: Family Medicine

## 2020-10-30 ENCOUNTER — Other Ambulatory Visit: Payer: Self-pay | Admitting: Family Medicine

## 2020-12-08 ENCOUNTER — Other Ambulatory Visit: Payer: Self-pay

## 2020-12-08 ENCOUNTER — Encounter: Payer: Self-pay | Admitting: Family Medicine

## 2020-12-08 ENCOUNTER — Ambulatory Visit (INDEPENDENT_AMBULATORY_CARE_PROVIDER_SITE_OTHER): Payer: BC Managed Care – PPO | Admitting: Family Medicine

## 2020-12-08 VITALS — BP 127/78 | HR 68 | Temp 97.9°F | Resp 18 | Ht 66.0 in | Wt 146.2 lb

## 2020-12-08 DIAGNOSIS — E785 Hyperlipidemia, unspecified: Secondary | ICD-10-CM

## 2020-12-08 DIAGNOSIS — Z0000 Encounter for general adult medical examination without abnormal findings: Secondary | ICD-10-CM

## 2020-12-08 DIAGNOSIS — Z23 Encounter for immunization: Secondary | ICD-10-CM

## 2020-12-08 DIAGNOSIS — I10 Essential (primary) hypertension: Secondary | ICD-10-CM

## 2020-12-08 MED ORDER — AMLODIPINE BESYLATE-VALSARTAN 5-320 MG PO TABS: 1.0000 | ORAL_TABLET | Freq: Every day | ORAL | 3 refills | Status: DC

## 2020-12-08 NOTE — Patient Instructions (Addendum)
Health Maintenance Due  Topic Date Due  . PAP SMEAR-schedule pap smear and have them send [place] a copy 12/05/2020   Shingrix #1 today. Repeat injection in 2-6 months. Schedule a nurse visit for the 2nd injection before you leave today (at the check out desk)  Let us know if you want to try the zoloft 50mg   Stop losartan (completely) and amlodipine 5 mg (by itself)  Start new combo pill valsartan amlodipine 320-5mg  and follow up with me if blood pressure is off prior to 6 months  Please stop by lab before you go If you have mychart- we will send your results within 3 business days of [place] receiving them.  If you do not have mychart- we will call you about results within 5 business days of [place] receiving them.  *please note we are currently using [facility] which has a longer processing time than [place] typically so labs may not come back as quickly as in the past *please also note that you will see labs on mychart as soon as they post. I will later go in and write notes on them- will say "notes from Dr. Yong Channel"    Recommended follow up: Return in about 6 months (around 06/08/2021) for follow up- or sooner if needed.

## 2020-12-08 NOTE — Assessment & Plan Note (Signed)
S: medication: Amlodipine 5 mg, atenolol 50 mg, losartan 100 mg -gets some dry cough and can cause some throat burning BP Readings from Last 3 Encounters:  12/08/20 127/78  06/05/20 100/62  01/29/20 118/80  A/P: Well-controlled-with cough will stop losartan 100mg . Change amlodipine into combo with valsartan at 320-5mg  and follow up next visit- hoping this helps

## 2020-12-08 NOTE — Progress Notes (Signed)
Phone [phone]   Subjective:  Patient presents today for their annual physical. Chief complaint-noted.   See problem oriented charting- ROS- full  review of systems was completed and negative except for: fatigue, post nasal drip, cough- possibly with ARB losartan, joint pain, back pain, seasonal allergies, sad mood, anxiety, sleep disturbance  The following were reviewed and entered/updated in epic: Past Medical History:  Diagnosis Date  . Claustrophobia    severe  . Depression   . H/O seasonal allergies   . Hemorrhoids   . Hypertension   . Postmenopausal HRT (hormone replacement therapy)    per pt, never took oral HR meds   Patient Active Problem List   Diagnosis Date Noted  . Hyperlipidemia, unspecified 06/05/2020    Priority: Medium  . Osteopenia 12/02/2017    Priority: Medium  . GAD (generalized anxiety disorder) 12/02/2017    Priority: Medium  . Major depression in full remission ([place]) 08/24/2007    Priority: Medium  . Essential hypertension 08/24/2007    Priority: Medium  . History of colonic polyps 06/13/2008    Priority: Low  . Allergic rhinitis 08/24/2007    Priority: Low   Past Surgical History:  Procedure Laterality Date  . CESAREAN SECTION     x2  . COLONOSCOPY    . WISDOM TOOTH EXTRACTION      Family History  Problem Relation Age of Onset  . Prostate cancer Father        smoker  . Hypertension Father   . Ulcers Father   . Hypertension Mother        sister as well  . Colon cancer Paternal Aunt   . Gout Brother   . Heart disease Brother   . Gout Sister   . Hypertension Sister   . Colon cancer Paternal Aunt   . Gout Sister        brother    Medications- reviewed and updated Current Outpatient Medications  Medication Sig Dispense Refill  . atenolol (TENORMIN) 50 MG tablet TAKE 1 TABLET BY MOUTH EVERY DAY 90 tablet 1  . buPROPion (WELLBUTRIN XL) 150 MG 24 hr tablet TAKE 2 TABLETS BY MOUTH ONCE DAILY 180 tablet 3  . Calcium  Carbonate-Vitamin D (CALCIUM-D PO) Take by mouth.    Marland Kitchen CHIA SEED PO Take by mouth. Patient states she grinds chia seed and incorporates into diet.    . Estradiol 10 MCG TABS vaginal tablet INSERT 1 TABLET VAGINALLY 2 TIMES A WEEK 24 tablet 4  . Flaxseed, Linseed, (FLAX SEEDS PO) Take by mouth. Patient states she grinds flax seeds and incorporates into diet.    Marland Kitchen glucosamine-chondroitin 500-400 MG tablet Take 1 tablet by mouth 2 (two) times daily as needed.    . Menaquinone-7 (VITAMIN K2 PO) Take by mouth.    . sertraline (ZOLOFT) 25 MG tablet TAKE 1 TABLET BY MOUTH EVERY DAY 90 tablet 1  . amLODipine-valsartan (EXFORGE) 5-320 MG tablet Take 1 tablet by mouth daily. 90 tablet 3  . Coenzyme Q10 (CO Q 10) 10 MG CAPS Take 1 tablet by mouth daily.    . TURMERIC PO Take by mouth.     No current facility-administered medications for this visit.    Allergies-reviewed and updated Allergies  Allergen Reactions  . Hydrochlorothiazide     REACTION: rash    Social History   Social History Narrative   Married. 2 living children, 1 died at birth. 4 grandchildren, 1 step grandchild.    Lives with husband.  Retired 2019   Former teacher-retired.    Prior with [employer]- caps and gowns etc. Stressful boss- no woff      Hobbies: rummy cube (tile game), antiquing, cooking, gardening   Objective  Objective:  BP 127/78   Pulse 68   Temp 97.9 F (36.6 C) (Temporal)   Resp 18   Ht 5\' 6"  (1.676 m)   Wt 146 lb 3.2 oz (66.3 kg)   LMP 10/02/2014   SpO2 98%   BMI 23.60 kg/m  Gen: NAD, resting comfortably HEENT: Mucous membranes are moist. Oropharynx normal Neck: no thyromegaly CV: RRR no murmurs rubs or gallops Lungs: CTAB no crackles, wheeze, rhonchi Abdomen: soft/nontender/nondistended/normal bowel sounds. No rebound or guarding.  Ext: no edema Skin: warm, dry Neuro: grossly normal, moves all extremities, PERRLA   Assessment and Plan   64 y.o. adult presenting for annual  physical.  Health Maintenance counseling: 1. Anticipatory guidance: Patient counseled regarding regular dental exams q6 months, eye exams- yearly,  avoiding smoking and second hand smoke, limiting alcohol to 1 beverage per day.   2. Risk factor reduction:  Advised patient of need for regular exercise and diet rich and fruits and vegetables to reduce risk of heart attack and stroke. Exercise- still not back at gym yet- trying to remain active with her grandkids- discussed trying to add regular exercise. Diet- Weight stable over the last year Wt Readings from Last 3 Encounters:  12/08/20 146 lb 3.2 oz (66.3 kg)  06/05/20 146 lb 6.4 oz (66.4 kg)  01/29/20 146 lb (66.2 kg)  3. Immunizations/screenings/ancillary studies discussed Shingrix today  Immunization History  Administered Date(s) Administered  . Fluad Quad(high Dose 65+) 10/06/2020  . Influenza Split 09/10/2011, 09/18/2012  . Influenza Whole 09/07/2010  . Influenza,inj,Quad PF,6+ Mos 09/24/2013, 09/25/2014, 10/16/2015, 09/17/2016, 08/17/2017, 11/06/2018, 08/22/2019  . Influenza,inj,quad, With Preservative 11/06/2018  . Influenza-Unspecified 08/17/2017, 08/22/2019  . Moderna Sars-Covid-2 Vaccination 02/24/2020, 03/23/2020, 10/16/2020  . PPD Test 04/10/2012  . Td 12/20/2000  . Tdap 09/18/2012  4. Cervical cancer screening- encouraged follow up- last pap 2018 with gynecology 5. Breast cancer screening-  breast exam with gynecology and mammogram 3D mammogram January 29, 2020 6. Colon cancer screening - due July 20, 2021 last done August 2017 with history of colon polyps 7. Skin cancer screening-does not see dermatology. advised regular sunscreen use. Denies worrisome, changing, or new skin lesions.   8. Birth control/STD check- postmenopausal. Only with husband Ron- declines 9. Osteoporosis screening at 20- known osteopenia follows with gynecology.  Compliant with calcium and vitamin D and K2-encouraged weightbearing exercise.  Last done  March 2020 -Never smoker  Status of chronic or acute concerns   #hypertension S: medication: Amlodipine 5 mg, atenolol 50 mg, losartan 100 mg -gets some dry cough and can cause some throat burning BP Readings from Last 3 Encounters:  12/08/20 127/78  06/05/20 100/62  01/29/20 118/80  A/P: Well-controlled-with cough will stop losartan 100mg . Change amlodipine into combo with valsartan at 320-5mg  and follow up next visit- hoping this helps  #hyperlipidemia S: Medication:none  Lab Results  Component Value Date   CHOL 208 (H) 12/06/2019   HDL 78.70 12/06/2019   LDLCALC 101 (H) 12/06/2019   TRIG 138.0 12/06/2019   CHOLHDL 3 12/06/2019   A/P:  10 year ascvd risk only 5.7% - update lipids and recalculate but likely focus on healthy eating/regular exercise.    # Depression/ GAD S: Medication:Wellbutrin 150 mg-takes 2 tablets once daily, Zoloft 25 mg.  Mild poor  control of depression and anxiety with worsened control with stressors  Did therapy in the past- wants to hold off for now  Father in law had to go into a retirement home- they had to clear the house out, she is having to help a ton with her son, exposed to covid through grandkids, stressed about how much her husband has had to work as well as some health issues (EGD postponed with Covid 19 exposure and pushed back 8 weeks - may need a stretching per her report and he is losing weight). Has not been able to be at home for a week in months.  Depression screen [facility] 2/9 12/08/2020 06/05/2020 12/06/2019  Decreased Interest 1 0 2  Down, Depressed, Hopeless 3 0 2  PHQ - 2 Score 4 0 4  Altered sleeping 2 0 1  Tired, decreased energy 2 1 1   Change in appetite 0 0 0  Feeling bad or failure about yourself  0 0 0  Trouble concentrating 1 0 0  Moving slowly or fidgety/restless 0 0 0  Suicidal thoughts 0 0 0  PHQ-9 Score 9 1 6   Difficult doing work/chores - Not difficult at all Not difficult at all   A/P: mild poor control- we  discussed potentially going up to 50mg  of zoloft- she wants to think this over and let me know. Continue wellbutrin 300mg    #Carpal tunnel-tolerable as long as she wears her braces if starting to flare up   # left big toe swelling at times relieved by ibuprofen- had recommended voltaren gel trial last visit - much better and has not had issues. Can get away with voltaren gel or tylenol -whch is better for blood pressure  Recommended follow up: Return in about 6 months (around 06/08/2021) for follow up- or sooner if needed. Future Appointments  Date Time Provider [place]  02/04/2021  8:00 AM Tamela Gammon, NP [facility]   Lab/Order associations:non fasting   ICD-10-CM   1. Preventative health care  Z00.00 Comprehensive metabolic panel    CBC with Differential/Platelet    Lipid panel  2. Essential hypertension  I10   3. Hyperlipidemia, unspecified hyperlipidemia type  E78.5 Comprehensive metabolic panel    CBC with Differential/Platelet    Lipid panel    Meds ordered this encounter  Medications  . amLODipine-valsartan (EXFORGE) 5-320 MG tablet    Sig: Take 1 tablet by mouth daily.    Dispense:  90 tablet    Refill:  3    Return precautions advised.  Garret Reddish, MD

## 2020-12-08 NOTE — Addendum Note (Signed)
Addended by: Linton Ham on: 12/08/2020 04:25 PM   Modules accepted: Orders

## 2020-12-09 LAB — COMPREHENSIVE METABOLIC PANEL
AG Ratio: 2 (calc) (ref 1.0–2.5)
ALT: 20 U/L (ref 6–29)
AST: 22 U/L (ref 10–35)
Albumin: 4.3 g/dL (ref 3.6–5.1)
Alkaline phosphatase (APISO): 62 U/L (ref 37–153)
BUN: 16 mg/dL (ref 7–25)
CO2: 30 mmol/L (ref 20–32)
Calcium: 9.5 mg/dL (ref 8.6–10.4)
Chloride: 104 mmol/L (ref 98–110)
Creat: 0.87 mg/dL (ref 0.50–0.99)
Globulin: 2.2 g/dL (calc) (ref 1.9–3.7)
Glucose, Bld: 81 mg/dL (ref 65–99)
Potassium: 4.3 mmol/L (ref 3.5–5.3)
Sodium: 141 mmol/L (ref 135–146)
Total Bilirubin: 0.3 mg/dL (ref 0.2–1.2)
Total Protein: 6.5 g/dL (ref 6.1–8.1)

## 2020-12-09 LAB — CBC WITH DIFFERENTIAL/PLATELET
Absolute Monocytes: 559 cells/uL (ref 200–950)
Basophils Absolute: 98 cells/uL (ref 0–200)
Basophils Relative: 1.5 %
Eosinophils Absolute: 254 cells/uL (ref 15–500)
Eosinophils Relative: 3.9 %
HCT: 38.5 % (ref 35.0–45.0)
Hemoglobin: 12.9 g/dL (ref 11.7–15.5)
Lymphs Abs: 2470 cells/uL (ref 850–3900)
MCH: 30.5 pg (ref 27.0–33.0)
MCHC: 33.5 g/dL (ref 32.0–36.0)
MCV: 91 fL (ref 80.0–100.0)
MPV: 10.6 fL (ref 7.5–12.5)
Monocytes Relative: 8.6 %
Neutro Abs: 3120 cells/uL (ref 1500–7800)
Neutrophils Relative %: 48 %
Platelets: 249 10*3/uL (ref 140–400)
RBC: 4.23 10*6/uL (ref 3.80–5.10)
RDW: 12.2 % (ref 11.0–15.0)
Total Lymphocyte: 38 %
WBC: 6.5 10*3/uL (ref 3.8–10.8)

## 2020-12-09 LAB — LIPID PANEL
Cholesterol: 197 mg/dL (ref <200)
HDL: 83 mg/dL (ref >=50)
LDL Cholesterol (Calc): 95 mg/dL (calc)
Non-HDL Cholesterol (Calc): 114 mg/dL (calc) (ref <130)
Total CHOL/HDL Ratio: 2.4 (calc) (ref <5.0)
Triglycerides: 95 mg/dL (ref <150)

## 2021-01-13 ENCOUNTER — Other Ambulatory Visit: Payer: Self-pay | Admitting: Family Medicine

## 2021-01-13 DIAGNOSIS — Z1231 Encounter for screening mammogram for malignant neoplasm of breast: Secondary | ICD-10-CM

## 2021-01-28 ENCOUNTER — Other Ambulatory Visit: Payer: Self-pay | Admitting: Family Medicine

## 2021-02-04 ENCOUNTER — Encounter: Payer: Self-pay | Admitting: Nurse Practitioner

## 2021-02-04 ENCOUNTER — Other Ambulatory Visit: Payer: Self-pay

## 2021-02-04 ENCOUNTER — Ambulatory Visit (INDEPENDENT_AMBULATORY_CARE_PROVIDER_SITE_OTHER): Payer: BC Managed Care – PPO | Admitting: Nurse Practitioner

## 2021-02-04 ENCOUNTER — Ambulatory Visit
Admission: RE | Admit: 2021-02-04 | Discharge: 2021-02-04 | Disposition: A | Discharge status: Home / Self Care | Payer: BC Managed Care – PPO | Source: Ambulatory Visit

## 2021-02-04 VITALS — BP 126/80 | Ht 66.0 in | Wt 140.0 lb

## 2021-02-04 DIAGNOSIS — Z1231 Encounter for screening mammogram for malignant neoplasm of breast: Secondary | ICD-10-CM

## 2021-02-04 DIAGNOSIS — N952 Postmenopausal atrophic vaginitis: Secondary | ICD-10-CM

## 2021-02-04 DIAGNOSIS — M8589 Other specified disorders of bone density and structure, multiple sites: Secondary | ICD-10-CM

## 2021-02-04 DIAGNOSIS — Z01419 Encounter for gynecological examination (general) (routine) without abnormal findings: Secondary | ICD-10-CM | POA: Diagnosis not present

## 2021-02-04 DIAGNOSIS — N951 Menopausal and female climacteric states: Secondary | ICD-10-CM

## 2021-02-04 DIAGNOSIS — Z780 Asymptomatic menopausal state: Secondary | ICD-10-CM | POA: Diagnosis not present

## 2021-02-04 MED ORDER — ESTRADIOL 10 MCG VA TABS
ORAL_TABLET | VAGINAL | 4 refills | Status: DC
Start: 2021-02-04 — End: 2022-02-15

## 2021-02-04 NOTE — Patient Instructions (Signed)

## 2021-02-04 NOTE — Addendum Note (Signed)
Addended by: Janalyn Harder A on: 02/04/2021 09:00 AM   Modules accepted: Orders

## 2021-02-04 NOTE — Progress Notes (Signed)
   Sharon Medina 1956-09-08 [id number]   History:  65 y.o. G3P2 presents for annual exam. Postmenopausal, uses vaginal estradiol for dryness twice weekly but still has irritation at times. She has tried Replens and it helps but is expensive. Normal pap and mammogram history. Osteopenia. 2017 tubular adenoma with 5-year recommendation for repeat colonoscopy.  Gynecologic History Patient's last menstrual period was 10/02/2014.   Contraception/Family planning: post menopausal status  Health Maintenance Last Pap: 11/2017. Results were: normal Last mammogram: 01/29/2020. Results were:  normal Last colonoscopy: 2017. Results were: tubular adenoma, 5-year recall Last Dexa: 02/2019. Results were: t-score -1.6  Past medical history, past surgical history, family history and social history were all reviewed and documented in the EPIC chart.  ROS:  A ROS was performed and pertinent positives and negatives are included.  Exam:  Vitals:   02/04/21 0801  BP: 126/80  Weight: 140 lb (63.5 kg)  Height: 5\' 6"  (1.676 m)   Body mass index is 22.6 kg/m.  General appearance:  Normal Thyroid:  Symmetrical, normal in size, without palpable masses or nodularity. Respiratory  Auscultation:  Clear without wheezing or rhonchi Cardiovascular  Auscultation:  Regular rate, without rubs, murmurs or gallops  Edema/varicosities:  Not grossly evident Abdominal  Soft,nontender, without masses, guarding or rebound.  Liver/spleen:  No organomegaly noted  Hernia:  None appreciated  Skin  Inspection:  Grossly normal   Breasts: Examined lying and sitting.   Right: Without masses, retractions, discharge or axillary adenopathy.   Left: Without masses, retractions, discharge or axillary adenopathy. Gentitourinary   Inguinal/mons:  Normal without inguinal adenopathy  External genitalia:  Normal  BUS/Urethra/Skene's glands:  Normal  Vagina: Atrophic changes  Cervix:  Normal  Uterus:  Normal in size, shape  and contour.  Midline and mobile  Adnexa/parametria:     Rt: Without masses or tenderness.   Lt: Without masses or tenderness.  Anus and perineum: Normal  Digital rectal exam: Normal sphincter tone without palpated masses or tenderness  Assessment/Plan:  65 y.o. G3P2 for annual exam.   Well female exam with routine gynecological exam - Education provided on SBEs, importance of preventative screenings, current guidelines, high calcium diet, regular exercise, and multivitamin daily. Labs with PCP.   Osteopenia of multiple sites - Plan: DG Bone Density - 02/2019 t-score -1.6. Continue Vitamin D supplement and incorporate weightbearing exercises into routine. She was going to the gym prior to pandemic but has not been able to do weight lifting since. She is very active with gardening. She will schedule DEXA soon.   Menopausal vaginal dryness - using Estadiol vaginal tablet twice weekly but still having irritation at times. She has tried Replens with improvement but it is expensive to use consistently. Discussed increasing Estradiol to 3 times weekly and using coconut oil for intercourse and maintenance lubrication.   Postmenopausal - Plan: DG Bone Density  Screening for cervical cancer - Normal Pap history.  Discussed current guidelines and she would like annual Paps.  Pap with reflex today.  Screening for breast cancer - Normal mammogram history.  Continue annual screenings.  She has mammogram today.  Normal breast exam today.  Screening for colon cancer -2017 tubular adenoma.  Will repeat at GI's recommended interval.   Follow-up in 1 year for annual.      Tamela Gammon [facility], 8:18 AM 02/04/2021

## 2021-02-05 LAB — PAP IG W/ RFLX HPV ASCU

## 2021-03-31 ENCOUNTER — Other Ambulatory Visit: Payer: Self-pay

## 2021-03-31 ENCOUNTER — Ambulatory Visit (INDEPENDENT_AMBULATORY_CARE_PROVIDER_SITE_OTHER): Payer: BC Managed Care – PPO

## 2021-03-31 ENCOUNTER — Other Ambulatory Visit: Payer: Self-pay | Admitting: Nurse Practitioner

## 2021-03-31 DIAGNOSIS — Z780 Asymptomatic menopausal state: Secondary | ICD-10-CM | POA: Diagnosis not present

## 2021-03-31 DIAGNOSIS — M8589 Other specified disorders of bone density and structure, multiple sites: Secondary | ICD-10-CM | POA: Diagnosis not present

## 2021-04-09 ENCOUNTER — Ambulatory Visit (INDEPENDENT_AMBULATORY_CARE_PROVIDER_SITE_OTHER): Payer: BC Managed Care – PPO

## 2021-04-09 ENCOUNTER — Other Ambulatory Visit: Payer: Self-pay

## 2021-04-09 DIAGNOSIS — Z23 Encounter for immunization: Secondary | ICD-10-CM | POA: Diagnosis not present

## 2021-04-09 NOTE — Progress Notes (Signed)
Sharon Medina 65 yr old female presents to office today 2nd shingles vaccine per Garret Reddish, MD. Administered SHINGRIX 0.5 mL IM right arm. Patient tolerated well.

## 2021-04-14 ENCOUNTER — Telehealth: Payer: Self-pay | Admitting: *Deleted

## 2021-04-14 NOTE — Telephone Encounter (Signed)
Patient left voicemail on Triage line asking for a return call in regards to BMD report and having a copy mailed to her house.   Returned call to patient. BMD results reviewed with patient and she verbalized understanding. Advised patient per records, report had been mailed to her on 04-09-21. Advised if did not receive in the next few days to return call to office. Patient agreeable.   Encounter closed.

## 2021-04-28 ENCOUNTER — Other Ambulatory Visit: Payer: Self-pay | Admitting: Family Medicine

## 2021-06-11 ENCOUNTER — Encounter: Payer: Self-pay | Admitting: Family Medicine

## 2021-06-11 ENCOUNTER — Ambulatory Visit: Payer: BC Managed Care – PPO | Admitting: Family Medicine

## 2021-06-11 ENCOUNTER — Ambulatory Visit: Payer: Medicare PPO | Admitting: Family Medicine

## 2021-06-11 ENCOUNTER — Other Ambulatory Visit: Payer: Self-pay

## 2021-06-11 VITALS — BP 142/86 | HR 65 | Temp 98.2°F | Ht 66.0 in | Wt 145.2 lb

## 2021-06-11 DIAGNOSIS — I10 Essential (primary) hypertension: Secondary | ICD-10-CM | POA: Diagnosis not present

## 2021-06-11 DIAGNOSIS — E785 Hyperlipidemia, unspecified: Secondary | ICD-10-CM | POA: Diagnosis not present

## 2021-06-11 DIAGNOSIS — F3342 Major depressive disorder, recurrent, in full remission: Secondary | ICD-10-CM

## 2021-06-11 DIAGNOSIS — Z23 Encounter for immunization: Secondary | ICD-10-CM

## 2021-06-11 NOTE — Progress Notes (Addendum)
Phone [phone] In person visit   Subjective:   Sharon Medina is a 65 y.o. year old very pleasant adult patient who presents for/with See problem oriented charting Chief Complaint  Patient presents with   Hypertension   Hyperlipidemia    This visit occurred during the SARS-CoV-2 public health emergency.  Safety protocols were in place, including screening questions prior to the visit, additional usage of staff PPE, and extensive cleaning of exam room while observing appropriate contact time as indicated for disinfecting solutions.   Past Medical History-  Patient Active Problem List   Diagnosis Date Noted   Hyperlipidemia, unspecified 06/05/2020    Priority: Medium   Osteopenia 12/02/2017    Priority: Medium   GAD (generalized anxiety disorder) 12/02/2017    Priority: Medium   Major depression in full remission ([place]) 08/24/2007    Priority: Medium   Essential hypertension 08/24/2007    Priority: Medium   History of colonic polyps 06/13/2008    Priority: Low   Allergic rhinitis 08/24/2007    Priority: Low    Medications- reviewed and updated Current Outpatient Medications  Medication Sig Dispense Refill   amLODipine-valsartan (EXFORGE) 5-320 MG tablet Take 1 tablet by mouth daily. 90 tablet 3   atenolol (TENORMIN) 50 MG tablet TAKE 1 TABLET BY MOUTH EVERY DAY 90 tablet 1   buPROPion (WELLBUTRIN XL) 150 MG 24 hr tablet TAKE 2 TABLETS BY MOUTH ONCE DAILY 180 tablet 3   Calcium Carbonate-Vitamin D (CALCIUM-D PO) Take by mouth.     CHIA SEED PO Take by mouth. Patient states she grinds chia seed and incorporates into diet.     Coenzyme Q10 (CO Q 10) 10 MG CAPS Take 1 tablet by mouth daily.     Estradiol 10 MCG TABS vaginal tablet INSERT 1 TABLET VAGINALLY 2 TIMES A WEEK 24 tablet 4   Flaxseed, Linseed, (FLAX SEEDS PO) Take by mouth. Patient states she grinds flax seeds and incorporates into diet.     fluorouracil (EFUDEX) 5 % cream SMARTSIG:Sparingly Topical Twice  Daily     glucosamine-chondroitin 500-400 MG tablet Take 1 tablet by mouth 2 (two) times daily as needed.     Menaquinone-7 (VITAMIN K2 PO) Take by mouth.     metroNIDAZOLE (METROCREAM) 0.75 % cream Apply topically 2 (two) times daily.     sertraline (ZOLOFT) 25 MG tablet TAKE 1 TABLET BY MOUTH EVERY DAY 90 tablet 1   TURMERIC PO Take by mouth.     No current facility-administered medications for this visit.     Objective:  BP (!) 142/86   Pulse 65   Temp 98.2 F (36.8 C) (Temporal)   Ht 5\' 6"  (1.676 m)   Wt 145 lb 3.2 oz (65.9 kg)   LMP 10/02/2014   SpO2 98%   BMI 23.44 kg/m  Gen: NAD, resting comfortably CV: RRR no murmurs rubs or gallops Lungs: CTAB no crackles, wheeze, rhonchi Ext: no edema Skin: warm, dry     Assessment and Plan  #hypertension S: medication: atenolol 50 mg,  Valsartan amlodipine 320-5 mg -coming off losartan helped significantly with cough/choking sensation BP Readings from Last 3 Encounters:  06/11/21 (!) 142/86  02/04/21 126/80  12/08/20 127/78  A/P: Stable. Continue current medications.    #hyperlipidemia S: Medication: none. Ascvd risk of 7.6% with current age and BP.  Lab Results  Component Value Date   CHOL 197 12/08/2020   HDL 83 12/08/2020   LDLCALC 95 12/08/2020   TRIG 95 12/08/2020  CHOLHDL 2.4 12/08/2020   A/P: check at CPE and if risk remains above 7.5% consider coronary calcium scoring test-he is open to considering this  # Depression/ GAD S: Medication: Wellbutrin 150 mg XR-- takes 2 tablets daily, Zoloft 25 mg  Still with some stressors but somewhat better- got father in laws home sold which was a big weight off. Depression screen [facility] 2/9 06/11/2021 12/08/2020 06/05/2020  Decreased Interest 0 1 0  Down, Depressed, Hopeless 0 3 0  PHQ - 2 Score 0 4 0  Altered sleeping 1 2 0  Tired, decreased energy 3 2 1   Change in appetite 0 0 0  Feeling bad or failure about yourself  0 0 0  Trouble concentrating 0 1 0  Moving slowly  or fidgety/restless 0 0 0  Suicidal thoughts 0 0 0  PHQ-9 Score 4 9 1   Difficult doing work/chores Not difficult at all - Not difficult at all  Some recent data might be hidden  A/P: full remission- continue current medicine  #Carpal tunnel- tolerable as long as she wears her braces if starting to flare up. Not that bad recently  #Left Big Toe swelling at times relieved by ibuprofen- Voltaren was recommended in the past- much better and has no issues. Can get by with Voltaren gel or Tylenol- which is better for blood pressure. Doing better lately- no recent issues  #Osteopenia- stable on most recent dexa 03/31/21  # possible vaccine reaction- after shingrix #3 on 04/09/21 developed some tongue swelling not sure of exact timing nad felt some tingling as well. The swelling subsided but continues to have mild to moderate tingling in the daytime worse as day progresses.  -She removed all supplements before her bone density and this did not make a difference. - no other recent new medicines other than metrocream starting in may but this was after tingling started.  -she wants to wait and see if improves over time -has spot on tongue  - if worsens she will let us know ( I do not strongly suspect angioedema with the valsartan- eave her on for now- no ongoing tongue swelling)  # saw dermatology- had 6 freezes and also was given efudex- having to wait as has a lot of sun exposure     Recommended follow up: Return in about 6 months (around 12/11/2021) for physical or sooner if needed. Future Appointments  Date Time Provider [place]  02/15/2022  8:00 AM Tamela Gammon, NP [facility] None    Lab/Order associations:   ICD-10-CM   1. Essential hypertension  I10     2. Hyperlipidemia, unspecified hyperlipidemia type  E78.5     3. Recurrent major depressive disorder, in full remission ([place])  F33.42      I,Harris Phan,acting as a scribe for Garret Reddish, MD.,have documented all  relevant documentation on the behalf of Garret Reddish, MD,as directed by  Garret Reddish, MD while in the presence of Garret Reddish, MD.   I, Garret Reddish, MD, have reviewed all documentation for this visit. The documentation on 06/11/21 for the exam, diagnosis, procedures, and orders are all accurate and complete.   Return precautions advised.  Garret Reddish, MD

## 2021-06-11 NOTE — Patient Instructions (Addendum)
Health Maintenance Due  Topic Date Due   PAP SMEAR-Modifier Sign release form at checkout.  12/05/2020   COVID-19 Vaccine (4 - Booster for Moderna series) will schedule in the fall.  01/16/2021   PNA vac Low Risk Adult (1 of 2 - PCV13) Prevnar 20 today. 05/27/2021    In regards to your tongues tingling sensation, if it worsens please let us know.  Recommended follow up: Return in about 6 months (around 12/11/2021) for physical or sooner if needed.

## 2021-06-11 NOTE — Addendum Note (Signed)
Addended by: Linton Ham on: 06/11/2021 11:36 AM   Modules accepted: Orders

## 2021-07-31 ENCOUNTER — Encounter: Payer: Self-pay | Admitting: Gastroenterology

## 2021-10-25 ENCOUNTER — Other Ambulatory Visit: Payer: Self-pay | Admitting: Family Medicine

## 2021-11-30 ENCOUNTER — Encounter: Payer: Self-pay | Admitting: Gastroenterology

## 2021-12-03 DIAGNOSIS — L818 Other specified disorders of pigmentation: Secondary | ICD-10-CM | POA: Diagnosis not present

## 2021-12-03 DIAGNOSIS — L570 Actinic keratosis: Secondary | ICD-10-CM | POA: Diagnosis not present

## 2021-12-03 DIAGNOSIS — L718 Other rosacea: Secondary | ICD-10-CM | POA: Diagnosis not present

## 2021-12-04 ENCOUNTER — Other Ambulatory Visit: Payer: Self-pay | Admitting: Family Medicine

## 2021-12-09 NOTE — Progress Notes (Signed)
Phone [phone]   Subjective:  Patient presents today for their annual physical. Chief complaint-noted.   See problem oriented charting- ROS- full  review of systems was completed and negative except for: fatigue, ear pain, hearing loss, sinus pressure, choking, cough, joint pain, rash, seasonal allergies, stable finger numbness, sleep disturbance, joint swelling  The following were reviewed and entered/updated in epic: Past Medical History:  Diagnosis Date   Claustrophobia    severe   Depression    H/O seasonal allergies    Hemorrhoids    Hypertension    Postmenopausal HRT (hormone replacement therapy)    per pt, never took oral HR meds   Patient Active Problem List   Diagnosis Date Noted   Hyperlipidemia, unspecified 06/05/2020    Priority: Medium    Osteopenia 12/02/2017    Priority: Medium    GAD (generalized anxiety disorder) 12/02/2017    Priority: Medium    Major depression in full remission ([place]) 08/24/2007    Priority: Medium    Essential hypertension 08/24/2007    Priority: Medium    History of colonic polyps 06/13/2008    Priority: Low   Allergic rhinitis 08/24/2007    Priority: Low   Past Surgical History:  Procedure Laterality Date   CESAREAN SECTION     x2   COLONOSCOPY     WISDOM TOOTH EXTRACTION      Family History  Problem Relation Age of Onset   Prostate cancer Father        smoker   Hypertension Father    Ulcers Father    Hypertension Mother        sister as well   Colon cancer Paternal Aunt    Gout Brother    Heart disease Brother    Gout Sister    Hypertension Sister    Colon cancer Paternal Aunt    Gout Sister        brother    Medications- reviewed and updated Current Outpatient Medications  Medication Sig Dispense Refill   amLODipine-valsartan (EXFORGE) 5-320 MG tablet TAKE 1 TABLET BY MOUTH DAILY 90 tablet 3   atenolol (TENORMIN) 50 MG tablet TAKE 1 TABLET BY MOUTH EVERY DAY 90 tablet 1   buPROPion (WELLBUTRIN XL)  150 MG 24 hr tablet TAKE 2 TABLETS BY MOUTH ONCE DAILY 180 tablet 3   Calcium Carbonate-Vitamin D (CALCIUM-D PO) Take by mouth.     Coenzyme Q10 (CO Q 10) 10 MG CAPS Take 1 tablet by mouth daily.     Estradiol 10 MCG TABS vaginal tablet INSERT 1 TABLET VAGINALLY 2 TIMES A WEEK 24 tablet 4   fluorouracil (EFUDEX) 5 % cream SMARTSIG:Sparingly Topical Twice Daily     fluticasone (FLONASE) 50 MCG/ACT nasal spray Place 2 sprays into both nostrils daily. 16 g 1   glucosamine-chondroitin 500-400 MG tablet Take 1 tablet by mouth 2 (two) times daily as needed.     Menaquinone-7 (VITAMIN K2 PO) Take by mouth.     metroNIDAZOLE (METROCREAM) 0.75 % cream Apply topically 2 (two) times daily.     sertraline (ZOLOFT) 25 MG tablet TAKE 1 TABLET BY MOUTH EVERY DAY 90 tablet 1   No current facility-administered medications for this visit.    Allergies-reviewed and updated Allergies  Allergen Reactions   Hydrochlorothiazide     REACTION: rash    Social History   Social History Narrative   Married. 2 living children, 1 died at birth. 4 grandchildren, 1 step grandchild.    Lives with husband.  Retired 2019   Former teacher-retired.    Prior with [employer]- caps and gowns etc. Stressful boss- no woff      Hobbies: rummy cube (tile game), antiquing, cooking, gardening   Objective  Objective:  BP 122/80    Pulse 67    Temp 98.1 F (36.7 C)    Ht 5\' 6"  (1.676 m)    Wt 145 lb 12.8 oz (66.1 kg)    LMP 10/02/2014    SpO2 98%    BMI 23.53 kg/m  Gen: NAD, resting comfortably HEENT: Mucous membranes are moist. Oropharynx normal. TM normal (perhaps slight bulge- possible OME?). Turbinates slightly swollen -distal tongue with slight red specks on distal portion  Neck: no obvious thyromegaly CV: RRR no murmurs rubs or gallops Lungs: CTAB no crackles, wheeze, rhonchi Abdomen: soft/nontender/nondistended/normal bowel sounds. No rebound or guarding.  Ext: no edema Skin: warm, dry Neuro: grossly  normal, moves all extremities, PERRLA   Assessment and Plan   65 y.o. adult presenting for annual physical.  Health Maintenance counseling: 1. Anticipatory guidance: Patient counseled regarding regular dental exams -q6 months, eye exams - yearly,  avoiding smoking and second hand smoke , limiting alcohol to 1 beverage per day.  No illicit drugs.  2. Risk factor reduction:  Advised patient of need for regular exercise and diet rich and fruits and vegetables to reduce risk of heart attack and stroke. Exercise- goal 150 mins a week discussed.  Diet/diet management-weight stable within last year Wt Readings from Last 3 Encounters:  12/25/21 145 lb 12.8 oz (66.1 kg)  06/11/21 145 lb 3.2 oz (65.9 kg)  02/04/21 140 lb (63.5 kg)  3. Immunizations/screenings/ancillary studies- fully up to date Immunization History  Administered Date(s) Administered   Fluad Quad(high Dose 65+) 10/06/2020   Influenza Split 09/10/2011, 09/18/2012   Influenza Whole 09/07/2010   Influenza,inj,Quad PF,6+ Mos 09/24/2013, 09/25/2014, 10/16/2015, 09/17/2016, 08/17/2017, 11/06/2018, 08/22/2019, 10/02/2021   Influenza,inj,quad, With Preservative 11/06/2018   Influenza-Unspecified 08/17/2017, 08/22/2019   Moderna Covid-19 Vaccine Bivalent Booster 66yrs & up 07/16/2021, 10/02/2021   Moderna Sars-Covid-2 Vaccination 02/24/2020, 03/23/2020, 10/16/2020   PNEUMOCOCCAL CONJUGATE-20 06/11/2021   PPD Test 04/10/2012   Td 12/20/2000   Tdap 09/18/2012   Zoster Recombinat (Shingrix) 12/08/2020, 04/09/2021   4. Cervical cancer screening- pap smear 12/05/17 with 5 year repeat planned due to HPV negative with Melrose young in the past- now with Perlie Gold, DNP 5. Breast cancer screening-  breast exam with GYN and mammogram 02/04/21 with 1 year repeat planned - 3d 6. Colon cancer screening - 07/20/16 with 5 year repeat planned- has upcoming appointment 7. Skin cancer screening- working with dermatology- has had some precancerous  removals- upcoming fluorouracil treatments. advised regular sunscreen use. Denies worrisome, changing, or new skin lesions.  8. Birth control/STD check- monogamous/postmenopaual 9. Osteoporosis screening at 40- DEXA 03/31/21 with 3 year repeat planned for osteopenia- discussed calc/vit D/wt bearing exercise -Never smoker  Status of chronic or acute concerns   #hypertension S: medication: atenolol 50 mg every day, and  amlodipine-valsartan 5-320 mg daily -coming off losartan helped significantly with cough/choking sensation BP Readings from Last 3 Encounters:  12/25/21 122/80  06/11/21 (!) 142/86  02/04/21 126/80  A/P: Controlled. Continue current medications.   #hyperlipidemia S: Medication: none.  Brother heart murmur but not CAD, no 1st degree relatives with CAD Lab Results  Component Value Date   CHOL 197 12/08/2020   HDL 83 12/08/2020   [place] 95 12/08/2020   TRIG 95 12/08/2020  CHOLHDL 2.4 12/08/2020   A/P: Ascvd risk of 5.7% with current age and BP at last visit- plan was to consider ct cardiac scroing if risk gets above 7.5%   # Depression/ GAD S: Medication: Wellbutrin 150 mg XR-- takes 2 tablets daily, Zoloft 25 mg  A/P: controlled /full remission for depression and GAD- 3 or less for PHQ9 and GAD.   # Hearing loss mild/ear fullness worse on right #Tongue swelling since shingrix #2- better but intermittnet S:tried e different antihistamines to see if it was her allergies but has not helped. She states this started after a head cold 6 weeks ago (sinus pressure improved but still getting crackling in both ears)  Intermittent papilla swelling- trouble swallowing if lays in certain positions- small red marks on fissures on tongue since- . Started after shingles shot- had swelling with this on 04/09/21- persisted since then A/P:  for hearing loss-possible OME- no significant wax in canal. Trial flonase in addition to antihistamine until sees ENT  -refer to ENT for  intermittent tongue swelling concern after shingrix- she had called but waited 45 mins without answer and wanted referral from [place]. Eating makes it worse- has to avoid mint- wonder if may even need allergy testing  #rash left low back went towards middle but now clearing - was itchy- she will let us know if worsens.   #Carpal tunnel- tolerable as long as she wore her braces if starting to flare up. Better overall recently thankfully  Recommended follow up: No follow-ups on file. Future Appointments  Date Time Provider [place]  02/05/2022  7:50 AM [facility] MM 3 [facility] CE  02/10/2022 10:00 AM [facility] PREVISIT RM 50 [facility]o  02/15/2022  9:30 AM Tamela Gammon, NP [facility] None  02/17/2022  8:00 AM Ladene Artist, MD [facility]   Lab/Order associations:NOT fasting   ICD-10-CM   1. Preventative health care  Z00.00     2. Essential hypertension  I10     3. Hyperlipidemia, unspecified hyperlipidemia type  E78.5 CBC with Differential/Platelet    Comprehensive metabolic panel    Lipid panel    4. Recurrent major depressive disorder, in full remission ([place])  F33.42     5. GAD (generalized anxiety disorder)  F41.1     6. Mild tongue swelling  R22.0 Ambulatory referral to ENT    7. Sensation of fullness in both ears  H93.8X3 Ambulatory referral to ENT      Meds ordered this encounter  Medications   fluticasone (FLONASE) 50 MCG/ACT nasal spray    Sig: Place 2 sprays into both nostrils daily.    Dispense:  16 g    Refill:  1   I,Jada Bradford,acting as a scribe for Garret Reddish, MD.,have documented all relevant documentation on the behalf of Garret Reddish, MD,as directed by  Garret Reddish, MD while in the presence of Garret Reddish, MD.  I, Garret Reddish, MD, have reviewed all documentation for this visit. The documentation on 12/25/21 for the exam, diagnosis, procedures, and orders are all accurate and complete.  Return precautions advised.   Garret Reddish, MD

## 2021-12-22 ENCOUNTER — Other Ambulatory Visit: Payer: Self-pay | Admitting: Diagnostic Radiology

## 2021-12-22 DIAGNOSIS — Z1231 Encounter for screening mammogram for malignant neoplasm of breast: Secondary | ICD-10-CM

## 2021-12-25 ENCOUNTER — Ambulatory Visit (INDEPENDENT_AMBULATORY_CARE_PROVIDER_SITE_OTHER): Payer: Medicare PPO | Admitting: Family Medicine

## 2021-12-25 ENCOUNTER — Other Ambulatory Visit: Payer: Self-pay | Admitting: Family Medicine

## 2021-12-25 ENCOUNTER — Other Ambulatory Visit: Payer: Self-pay

## 2021-12-25 ENCOUNTER — Encounter: Payer: Self-pay | Admitting: Family Medicine

## 2021-12-25 VITALS — BP 122/80 | HR 67 | Temp 98.1°F | Ht 66.0 in | Wt 145.8 lb

## 2021-12-25 DIAGNOSIS — F411 Generalized anxiety disorder: Secondary | ICD-10-CM | POA: Diagnosis not present

## 2021-12-25 DIAGNOSIS — R220 Localized swelling, mass and lump, head: Secondary | ICD-10-CM | POA: Diagnosis not present

## 2021-12-25 DIAGNOSIS — H938X3 Other specified disorders of ear, bilateral: Secondary | ICD-10-CM | POA: Diagnosis not present

## 2021-12-25 DIAGNOSIS — I10 Essential (primary) hypertension: Secondary | ICD-10-CM

## 2021-12-25 DIAGNOSIS — Z0000 Encounter for general adult medical examination without abnormal findings: Secondary | ICD-10-CM

## 2021-12-25 DIAGNOSIS — E785 Hyperlipidemia, unspecified: Secondary | ICD-10-CM | POA: Diagnosis not present

## 2021-12-25 DIAGNOSIS — F3342 Major depressive disorder, recurrent, in full remission: Secondary | ICD-10-CM | POA: Diagnosis not present

## 2021-12-25 LAB — CBC WITH DIFFERENTIAL/PLATELET
Basophils Absolute: 0.1 10*3/uL (ref 0.0–0.1)
Basophils Relative: 1.3 % (ref 0.0–3.0)
Eosinophils Absolute: 0.2 10*3/uL (ref 0.0–0.7)
Eosinophils Relative: 3.8 % (ref 0.0–5.0)
HCT: 39.4 % (ref 36.0–46.0)
Hemoglobin: 13 g/dL (ref 12.0–15.0)
Lymphocytes Relative: 31.5 % (ref 12.0–46.0)
Lymphs Abs: 1.5 10*3/uL (ref 0.7–4.0)
MCHC: 33 g/dL (ref 30.0–36.0)
MCV: 89.9 fl (ref 78.0–100.0)
Monocytes Absolute: 0.4 10*3/uL (ref 0.1–1.0)
Monocytes Relative: 8.4 % (ref 3.0–12.0)
Neutro Abs: 2.6 10*3/uL (ref 1.4–7.7)
Neutrophils Relative %: 55 % (ref 43.0–77.0)
Platelets: 237 10*3/uL (ref 150.0–400.0)
RBC: 4.38 Mil/uL (ref 3.87–5.11)
RDW: 13.4 % (ref 11.5–15.5)
WBC: 4.7 10*3/uL (ref 4.0–10.5)

## 2021-12-25 LAB — COMPREHENSIVE METABOLIC PANEL
ALT: 15 U/L (ref 0–35)
AST: 18 U/L (ref 0–37)
Albumin: 4.2 g/dL (ref 3.5–5.2)
Alkaline Phosphatase: 53 U/L (ref 39–117)
BUN: 18 mg/dL (ref 6–23)
CO2: 32 mEq/L (ref 19–32)
Calcium: 9.3 mg/dL (ref 8.4–10.5)
Chloride: 104 mEq/L (ref 96–112)
Creatinine, Ser: 0.8 mg/dL (ref 0.40–1.20)
GFR: 77.23 mL/min (ref >60.00)
Glucose, Bld: 84 mg/dL (ref 70–99)
Potassium: 5.2 mEq/L — ABNORMAL HIGH (ref 3.5–5.1)
Sodium: 139 mEq/L (ref 135–145)
Total Bilirubin: 0.4 mg/dL (ref 0.2–1.2)
Total Protein: 6.7 g/dL (ref 6.0–8.3)

## 2021-12-25 LAB — LIPID PANEL
Cholesterol: 190 mg/dL (ref 0–200)
HDL: 78.1 mg/dL (ref >39.00)
LDL Cholesterol: 93 mg/dL (ref 0–99)
NonHDL: 111.53
Total CHOL/HDL Ratio: 2
Triglycerides: 91 mg/dL (ref 0.0–149.0)
VLDL: 18.2 mg/dL (ref 0.0–40.0)

## 2021-12-25 MED ORDER — FLUTICASONE PROPIONATE 50 MCG/ACT NA SUSP: 2.0000 | Freq: Every day | NASAL | 1 refills | Status: DC

## 2021-12-25 NOTE — Patient Instructions (Addendum)
Glad you had such a good Holiday season!   We will call you within two weeks about your referral to ENT. If you do not hear within 2 weeks, give [place] a call.  -try flonase in addition to antihistamine until your visit  Please stop by lab before you go If you have mychart- we will send your results within 3 business days of [place] receiving them.  If you do not have mychart- we will call you about results within 5 business days of [place] receiving them.  *please also note that you will see labs on mychart as soon as they post. I will later go in and write notes on them- will say "notes from Dr. Yong Channel"

## 2021-12-28 ENCOUNTER — Other Ambulatory Visit: Payer: Self-pay

## 2021-12-28 DIAGNOSIS — E875 Hyperkalemia: Secondary | ICD-10-CM

## 2021-12-29 ENCOUNTER — Other Ambulatory Visit: Payer: Self-pay

## 2021-12-29 ENCOUNTER — Other Ambulatory Visit (INDEPENDENT_AMBULATORY_CARE_PROVIDER_SITE_OTHER): Payer: Medicare PPO

## 2021-12-29 DIAGNOSIS — E875 Hyperkalemia: Secondary | ICD-10-CM | POA: Diagnosis not present

## 2021-12-29 LAB — BASIC METABOLIC PANEL
BUN: 20 mg/dL (ref 6–23)
CO2: 31 mEq/L (ref 19–32)
Calcium: 9.7 mg/dL (ref 8.4–10.5)
Chloride: 102 mEq/L (ref 96–112)
Creatinine, Ser: 0.88 mg/dL (ref 0.40–1.20)
GFR: 68.88 mL/min (ref >60.00)
Glucose, Bld: 89 mg/dL (ref 70–99)
Potassium: 5.2 mEq/L — ABNORMAL HIGH (ref 3.5–5.1)
Sodium: 138 mEq/L (ref 135–145)

## 2021-12-30 ENCOUNTER — Other Ambulatory Visit: Payer: Self-pay

## 2021-12-30 DIAGNOSIS — E875 Hyperkalemia: Secondary | ICD-10-CM

## 2022-01-20 ENCOUNTER — Other Ambulatory Visit: Payer: Self-pay | Admitting: Family Medicine

## 2022-02-02 DIAGNOSIS — J343 Hypertrophy of nasal turbinates: Secondary | ICD-10-CM | POA: Diagnosis not present

## 2022-02-02 DIAGNOSIS — J310 Chronic rhinitis: Secondary | ICD-10-CM | POA: Diagnosis not present

## 2022-02-02 DIAGNOSIS — H903 Sensorineural hearing loss, bilateral: Secondary | ICD-10-CM | POA: Diagnosis not present

## 2022-02-02 DIAGNOSIS — H6981 Other specified disorders of Eustachian tube, right ear: Secondary | ICD-10-CM | POA: Diagnosis not present

## 2022-02-04 ENCOUNTER — Other Ambulatory Visit: Payer: Self-pay

## 2022-02-04 ENCOUNTER — Other Ambulatory Visit (INDEPENDENT_AMBULATORY_CARE_PROVIDER_SITE_OTHER): Payer: Medicare PPO

## 2022-02-04 DIAGNOSIS — E875 Hyperkalemia: Secondary | ICD-10-CM | POA: Diagnosis not present

## 2022-02-04 LAB — BASIC METABOLIC PANEL
BUN: 15 mg/dL (ref 6–23)
CO2: 33 mEq/L — ABNORMAL HIGH (ref 19–32)
Calcium: 9.3 mg/dL (ref 8.4–10.5)
Chloride: 99 mEq/L (ref 96–112)
Creatinine, Ser: 0.81 mg/dL (ref 0.40–1.20)
GFR: 76.03 mL/min (ref >60.00)
Glucose, Bld: 100 mg/dL — ABNORMAL HIGH (ref 70–99)
Potassium: 3.9 mEq/L (ref 3.5–5.1)
Sodium: 134 mEq/L — ABNORMAL LOW (ref 135–145)

## 2022-02-05 DIAGNOSIS — Z1231 Encounter for screening mammogram for malignant neoplasm of breast: Secondary | ICD-10-CM

## 2022-02-15 ENCOUNTER — Ambulatory Visit: Payer: BC Managed Care – PPO | Admitting: Nurse Practitioner

## 2022-02-15 ENCOUNTER — Encounter: Payer: Self-pay | Admitting: Nurse Practitioner

## 2022-02-15 ENCOUNTER — Ambulatory Visit (INDEPENDENT_AMBULATORY_CARE_PROVIDER_SITE_OTHER): Payer: Medicare PPO | Admitting: Nurse Practitioner

## 2022-02-15 ENCOUNTER — Other Ambulatory Visit: Payer: Self-pay

## 2022-02-15 VITALS — BP 124/82 | Ht 65.5 in | Wt 140.0 lb

## 2022-02-15 DIAGNOSIS — M8589 Other specified disorders of bone density and structure, multiple sites: Secondary | ICD-10-CM

## 2022-02-15 DIAGNOSIS — Z780 Asymptomatic menopausal state: Secondary | ICD-10-CM

## 2022-02-15 DIAGNOSIS — N898 Other specified noninflammatory disorders of vagina: Secondary | ICD-10-CM | POA: Diagnosis not present

## 2022-02-15 DIAGNOSIS — Z01419 Encounter for gynecological examination (general) (routine) without abnormal findings: Secondary | ICD-10-CM | POA: Diagnosis not present

## 2022-02-15 DIAGNOSIS — N952 Postmenopausal atrophic vaginitis: Secondary | ICD-10-CM

## 2022-02-15 LAB — WET PREP FOR TRICH, YEAST, CLUE

## 2022-02-15 MED ORDER — ESTRADIOL 10 MCG VA TABS: ORAL_TABLET | VAGINAL | 4 refills | Status: DC

## 2022-02-15 NOTE — Progress Notes (Signed)
Sharon Medina Apr 22, 1956 [id number]   History:  66 y.o. G3P2 presents for annual exam. Postmenopausal, uses vaginal estradiol for dryness and irritation. Normal pap and mammogram history. HTN, depression managed by PCP. Husband being treated for sepsis since January, having severe back pain as well. She has had some intermittent vaginal itching and use yeast treatment with some improvement. Would like checked today.   Gynecologic History Patient's last menstrual period was 10/02/2014.   Contraception/Family planning: post menopausal status Sexually active: No  Health Maintenance Last Pap: 02/04/2021. Results were: Normal, 3-year repeat Last mammogram: 02/04/2021. Results were:  Normal Last colonoscopy: 2017. Results were: Tubular adenoma, 5-year recall Last Dexa: 03/31/2021. Results were: T-score -1.6, FRAX 8.6% / 1.0%  Past medical history, past surgical history, family history and social history were all reviewed and documented in the EPIC chart. Married. Retired. 2 children, live in [place] area. 2 paternal aunts with history of colon cancer.   ROS:  A ROS was performed and pertinent positives and negatives are included.  Exam:  Vitals:   02/15/22 0922  BP: 124/82  Weight: 140 lb (63.5 kg)  Height: 5' 5.5" (1.664 m)    Body mass index is 22.94 kg/m.  General appearance:  Normal Thyroid:  Symmetrical, normal in size, without palpable masses or nodularity. Respiratory  Auscultation:  Clear without wheezing or rhonchi Cardiovascular  Auscultation:  Regular rate, without rubs, murmurs or gallops  Edema/varicosities:  Not grossly evident Abdominal  Soft,nontender, without masses, guarding or rebound.  Liver/spleen:  No organomegaly noted  Hernia:  None appreciated  Skin  Inspection:  Grossly normal   Breasts: Examined lying and sitting.   Right: Without masses, retractions, discharge or axillary adenopathy.   Left: Without masses, retractions, discharge or axillary  adenopathy. Genitourinary   Inguinal/mons:  Normal without inguinal adenopathy  External genitalia:  Normal appearing vulva with no masses, tenderness, or lesions  BUS/Urethra/Skene's glands:  Normal  Vagina:  Normal appearing with normal color and discharge, no lesions. Atrophic changes  Cervix:  Normal appearing without discharge or lesions  Uterus:  Normal in size, shape and contour.  Midline and mobile, nontender  Adnexa/parametria:     Rt: Normal in size, without masses or tenderness.   Lt: Normal in size, without masses or tenderness.  Anus and perineum: Normal  Digital rectal exam: Normal sphincter tone without palpated masses or tenderness  Patient informed chaperone available to be present for breast and pelvic exam. Patient has requested no chaperone to be present. Patient has been advised what will be completed during breast and pelvic exam.   Wet prep negative  Assessment/Plan:  66 y.o. G3P2 for breast and pelvic exam.   Well female exam with routine gynecological exam - Education provided on SBEs, importance of preventative screenings, current guidelines, high calcium diet, regular exercise, and multivitamin daily. Labs with PCP.   Osteopenia of multiple sites - Plan: DG Bone Density - 03/2021 T-score -1.6 without elevated FRAX. Continue Vitamin D supplement and incorporate weightbearing exercises into routine. Plans to get back into the gym when husband recovers. She is very active with gardening.   Postmenopausal - no HRT, no bleeding  Postmenopausal atrophic vaginitis - Plan: Estradiol 10 MCG TABS vaginal tablet twice weekly with good management. She is aware of risk for small amount of systemic absorption increasing her risk for blood clots, heart attack, stroke, and breast cancer. She would like to continue. Refill x 1 year provided.   Vaginal itching - Plan: WET PREP  FOR [place], YEAST, CLUE. Negative wet prep.   Screening for cervical cancer - Normal Pap history.   Discussed current guidelines and option to stop screenings. Will readdress when pap is due in 2025.  Screening for breast cancer - Normal mammogram history.  Continue annual screenings. Normal breast exam today. Had to cancel due to caring for husband and will reschedule soon.   Screening for colon cancer -2017 tubular adenoma. Overdue but had to cancel due to caring for husband and will reschedule soon.    Follow-up in 1 year for medication follow up (vaginal estrogen) and 2 years for breast and pelvic exam.       Tamela Gammon [facility], 9:39 AM 02/15/2022

## 2022-02-17 ENCOUNTER — Encounter: Payer: Medicare PPO | Admitting: Gastroenterology

## 2022-03-31 ENCOUNTER — Other Ambulatory Visit: Payer: Self-pay | Admitting: Family Medicine

## 2022-03-31 DIAGNOSIS — Z1231 Encounter for screening mammogram for malignant neoplasm of breast: Secondary | ICD-10-CM

## 2022-04-02 ENCOUNTER — Ambulatory Visit
Admission: RE | Admit: 2022-04-02 | Discharge: 2022-04-02 | Disposition: A | Discharge status: Home / Self Care | Payer: Medicare PPO | Source: Ambulatory Visit

## 2022-04-02 DIAGNOSIS — Z1231 Encounter for screening mammogram for malignant neoplasm of breast: Secondary | ICD-10-CM

## 2022-04-06 DIAGNOSIS — H903 Sensorineural hearing loss, bilateral: Secondary | ICD-10-CM | POA: Diagnosis not present

## 2022-04-06 DIAGNOSIS — J342 Deviated nasal septum: Secondary | ICD-10-CM | POA: Diagnosis not present

## 2022-04-06 DIAGNOSIS — J310 Chronic rhinitis: Secondary | ICD-10-CM | POA: Diagnosis not present

## 2022-04-06 DIAGNOSIS — H6983 Other specified disorders of Eustachian tube, bilateral: Secondary | ICD-10-CM | POA: Diagnosis not present

## 2022-04-06 DIAGNOSIS — J343 Hypertrophy of nasal turbinates: Secondary | ICD-10-CM | POA: Diagnosis not present

## 2022-04-20 ENCOUNTER — Other Ambulatory Visit: Payer: Self-pay | Admitting: Family Medicine

## 2022-05-04 DIAGNOSIS — L814 Other melanin hyperpigmentation: Secondary | ICD-10-CM | POA: Diagnosis not present

## 2022-05-04 DIAGNOSIS — Z789 Other specified health status: Secondary | ICD-10-CM | POA: Diagnosis not present

## 2022-05-04 DIAGNOSIS — L820 Inflamed seborrheic keratosis: Secondary | ICD-10-CM | POA: Diagnosis not present

## 2022-05-04 DIAGNOSIS — L538 Other specified erythematous conditions: Secondary | ICD-10-CM | POA: Diagnosis not present

## 2022-05-04 DIAGNOSIS — L821 Other seborrheic keratosis: Secondary | ICD-10-CM | POA: Diagnosis not present

## 2022-05-04 DIAGNOSIS — D1801 Hemangioma of skin and subcutaneous tissue: Secondary | ICD-10-CM | POA: Diagnosis not present

## 2022-05-04 DIAGNOSIS — L298 Other pruritus: Secondary | ICD-10-CM | POA: Diagnosis not present

## 2022-05-20 ENCOUNTER — Ambulatory Visit (INDEPENDENT_AMBULATORY_CARE_PROVIDER_SITE_OTHER): Payer: Medicare PPO

## 2022-05-20 DIAGNOSIS — Z1211 Encounter for screening for malignant neoplasm of colon: Secondary | ICD-10-CM | POA: Diagnosis not present

## 2022-05-20 DIAGNOSIS — Z0000 Encounter for general adult medical examination without abnormal findings: Secondary | ICD-10-CM | POA: Diagnosis not present

## 2022-05-20 DIAGNOSIS — Z86010 Personal history of colonic polyps: Secondary | ICD-10-CM | POA: Diagnosis not present

## 2022-05-20 NOTE — Patient Instructions (Signed)
Ms. Naraine , Thank you for taking time to come for your Medicare Wellness Visit. I appreciate your ongoing commitment to your health goals. Please review the following plan we discussed and let me know if I can assist you in the future.   Screening recommendations/referrals: Colonoscopy: order placed 05/20/22  Mammogram: Done 04/02/22 repeat every year  Bone Density: Done 03/31/21 repeat every 3 years   Recommended yearly ophthalmology/optometry visit for glaucoma screening and checkup Recommended yearly dental visit for hygiene and checkup  Vaccinations: Influenza vaccine: Done 10/02/21 repeat every year  Pneumococcal vaccine: Up to date Tdap vaccine: Done 09/18/12 due every 10 years  Shingles vaccine: Completed 12/08/20 & 04/09/21   Covid-19:Completed 3/7, 4/4, 10/16/20 & 7/28, 10/02/21  Advanced directives: Please bring a copy of your health care power of attorney and living will to the office at your convenience.  Conditions/risks identified: None at this time   Next appointment: Follow up in one year for your annual wellness visit    Preventive Care 65 Years and Older, Female Preventive care refers to lifestyle choices and visits with your health care provider that can promote health and wellness. What does preventive care include? A yearly physical exam. This is also called an annual well check. Dental exams once or twice a year. Routine eye exams. Ask your health care provider how often you should have your eyes checked. Personal lifestyle choices, including: Daily care of your teeth and gums. Regular physical activity. Eating a healthy diet. Avoiding tobacco and drug use. Limiting alcohol use. Practicing safe sex. Taking low-dose aspirin every day. Taking vitamin and mineral supplements as recommended by your health care provider. What happens during an annual well check? The services and screenings done by your health care provider during your annual well check will  depend on your age, overall health, lifestyle risk factors, and family history of disease. Counseling  Your health care provider may ask you questions about your: Alcohol use. Tobacco use. Drug use. Emotional well-being. Home and relationship well-being. Sexual activity. Eating habits. History of falls. Memory and ability to understand (cognition). Work and work Statistician. Reproductive health. Screening  You may have the following tests or measurements: Height, weight, and BMI. Blood pressure. Lipid and cholesterol levels. These may be checked every 5 years, or more frequently if you are over 39 years old. Skin check. Lung cancer screening. You may have this screening every year starting at age 75 if you have a 30-pack-year history of smoking and currently smoke or have quit within the past 15 years. Fecal occult blood test (FOBT) of the stool. You may have this test every year starting at age 65. Flexible sigmoidoscopy or colonoscopy. You may have a sigmoidoscopy every 5 years or a colonoscopy every 10 years starting at age 67. Hepatitis C blood test. Hepatitis B blood test. Sexually transmitted disease (STD) testing. Diabetes screening. This is done by checking your blood sugar (glucose) after you have not eaten for a while (fasting). You may have this done every 1-3 years. Bone density scan. This is done to screen for osteoporosis. You may have this done starting at age 82. Mammogram. This may be done every 1-2 years. Talk to your health care provider about how often you should have regular mammograms. Talk with your health care provider about your test results, treatment options, and if necessary, the need for more tests. Vaccines  Your health care provider may recommend certain vaccines, such as: Influenza vaccine. This is recommended every year. Tetanus,  diphtheria, and acellular pertussis (Tdap, Td) vaccine. You may need a Td booster every 10 years. Zoster vaccine. You may  need this after age 71. Pneumococcal 13-valent conjugate (PCV13) vaccine. One dose is recommended after age 14. Pneumococcal polysaccharide (PPSV23) vaccine. One dose is recommended after age 42. Talk to your health care provider about which screenings and vaccines you need and how often you need them. This information is not intended to replace advice given to you by your health care provider. Make sure you discuss any questions you have with your health care provider. Document Released: 01/02/2016 Document Revised: 08/25/2016 Document Reviewed: 10/07/2015 Elsevier Interactive Patient Education  2017 [place] Prevention in the Home Falls can cause injuries. They can happen to people of all ages. There are many things you can do to make your home safe and to help prevent falls. What can I do on the outside of my home? Regularly fix the edges of walkways and driveways and fix any cracks. Remove anything that might make you trip as you walk through a door, such as a raised step or threshold. Trim any bushes or trees on the path to your home. Use bright outdoor lighting. Clear any walking paths of anything that might make someone trip, such as rocks or tools. Regularly check to see if handrails are loose or broken. Make sure that both sides of any steps have handrails. Any raised decks and porches should have guardrails on the edges. Have any leaves, snow, or ice cleared regularly. Use sand or salt on walking paths during winter. Clean up any spills in your garage right away. This includes oil or grease spills. What can I do in the bathroom? Use night lights. Install grab bars by the toilet and in the tub and shower. Do not use towel bars as grab bars. Use non-skid mats or decals in the tub or shower. If you need to sit down in the shower, use a plastic, non-slip stool. Keep the floor dry. Clean up any water that spills on the floor as soon as it happens. Remove soap buildup in  the tub or shower regularly. Attach bath mats securely with double-sided non-slip rug tape. Do not have throw rugs and other things on the floor that can make you trip. What can I do in the bedroom? Use night lights. Make sure that you have a light by your bed that is easy to reach. Do not use any sheets or blankets that are too big for your bed. They should not hang down onto the floor. Have a firm chair that has side arms. You can use this for support while you get dressed. Do not have throw rugs and other things on the floor that can make you trip. What can I do in the kitchen? Clean up any spills right away. Avoid walking on wet floors. Keep items that you use a lot in easy-to-reach places. If you need to reach something above you, use a strong step stool that has a grab bar. Keep electrical cords out of the way. Do not use floor polish or wax that makes floors slippery. If you must use wax, use non-skid floor wax. Do not have throw rugs and other things on the floor that can make you trip. What can I do with my stairs? Do not leave any items on the stairs. Make sure that there are handrails on both sides of the stairs and use them. Fix handrails that are broken or loose. Make  sure that handrails are as long as the stairways. Check any carpeting to make sure that it is firmly attached to the stairs. Fix any carpet that is loose or worn. Avoid having throw rugs at the top or bottom of the stairs. If you do have throw rugs, attach them to the floor with carpet tape. Make sure that you have a light switch at the top of the stairs and the bottom of the stairs. If you do not have them, ask someone to add them for you. What else can I do to help prevent falls? Wear shoes that: Do not have high heels. Have rubber bottoms. Are comfortable and fit you well. Are closed at the toe. Do not wear sandals. If you use a stepladder: Make sure that it is fully opened. Do not climb a closed  stepladder. Make sure that both sides of the stepladder are locked into place. Ask someone to hold it for you, if possible. Clearly mark and make sure that you can see: Any grab bars or handrails. First and last steps. Where the edge of each step is. Use tools that help you move around (mobility aids) if they are needed. These include: Canes. Walkers. Scooters. Crutches. Turn on the lights when you go into a dark area. Replace any light bulbs as soon as they burn out. Set up your furniture so you have a clear path. Avoid moving your furniture around. If any of your floors are uneven, fix them. If there are any pets around you, be aware of where they are. Review your medicines with your doctor. Some medicines can make you feel dizzy. This can increase your chance of falling. Ask your doctor what other things that you can do to help prevent falls. This information is not intended to replace advice given to you by your health care provider. Make sure you discuss any questions you have with your health care provider. Document Released: 10/02/2009 Document Revised: 05/13/2016 Document Reviewed: 01/10/2015 Elsevier Interactive Patient Education  2017 [employer].

## 2022-05-20 NOTE — Progress Notes (Addendum)
Virtual Visit via Telephone Note  I connected with  Sharon Medina on 05/20/22 at  2:15 PM EDT by telephone and verified that I am speaking with the correct person using two identifiers.  Medicare Annual Wellness visit completed telephonically due to Covid-19 pandemic.   Persons participating in this call: This Health Coach and this patient.   Location: Patient: Home Provider: office    I discussed the limitations, risks, security and privacy concerns of performing an evaluation and management service by telephone and the availability of in person appointments. The patient expressed understanding and agreed to proceed.  Unable to perform video visit due to video visit attempted and failed and/or patient does not have video capability.   Some vital signs may be absent or patient reported.   Willette Brace, LPN   Subjective:   Sharon Medina is a 66 y.o. female who presents for an Initial Medicare Annual Wellness Visit.  Review of Systems     Cardiac Risk Factors include: advanced age (>31mn, >>47women);dyslipidemia;hypertension     Objective:    There were no vitals filed for this visit. There is no height or weight on file to calculate BMI.     05/20/2022    2:04 PM 07/20/2016    8:06 AM  Advanced Directives  Does Patient Have a Medical Advance Directive? Yes No  Type of Advance Directive [place] Chart? No - copy requested     Current Medications (verified) Outpatient Encounter Medications as of 05/20/2022  Medication Sig   amLODipine-valsartan (EXFORGE) 5-320 MG tablet TAKE 1 TABLET BY MOUTH DAILY   atenolol (TENORMIN) 50 MG tablet TAKE 1 TABLET BY MOUTH EVERY DAY   buPROPion (WELLBUTRIN XL) 150 MG 24 hr tablet TAKE 2 TABLETS BY MOUTH EVERY DAY   Calcium Carbonate-Vitamin D (CALCIUM-D PO) Take by mouth.   Coenzyme Q10 (CO Q 10) 10 MG CAPS Take 1 tablet by mouth daily.   Estradiol 10 MCG TABS  vaginal tablet INSERT 1 TABLET VAGINALLY 2 TIMES A WEEK   fluorouracil (EFUDEX) 5 % cream SMARTSIG:Sparingly Topical Twice Daily   fluticasone (FLONASE) 50 MCG/ACT nasal spray SHAKE LIQUID AND USE 2 SPRAYS IN EACH NOSTRIL DAILY   glucosamine-chondroitin 500-400 MG tablet Take 1 tablet by mouth 2 (two) times daily as needed.   Menaquinone-7 (VITAMIN K2 PO) Take by mouth.   metroNIDAZOLE (METROCREAM) 0.75 % cream Apply topically 2 (two) times daily.   sertraline (ZOLOFT) 25 MG tablet TAKE 1 TABLET BY MOUTH EVERY DAY   No facility-administered encounter medications on file as of 05/20/2022.    Allergies (verified) Hydrochlorothiazide   History: Past Medical History:  Diagnosis Date   Claustrophobia    severe   Depression    H/O seasonal allergies    Hemorrhoids    Hypertension    Postmenopausal HRT (hormone replacement therapy)    per pt, never took oral HR meds   Past Surgical History:  Procedure Laterality Date   CESAREAN SECTION     x2   COLONOSCOPY     WISDOM TOOTH EXTRACTION     Family History  Problem Relation Age of Onset   Prostate cancer Father        smoker   Hypertension Father    Ulcers Father    Hypertension Mother        sister as well   Colon cancer Paternal Aunt    Gout Brother  Heart disease Brother    Gout Sister    Hypertension Sister    Colon cancer Paternal Aunt    Gout Sister        brother   Social History   Socioeconomic History   Marital status: Married    Spouse name: Not on file   Number of children: Not on file   Years of education: Not on file   Highest education level: Not on file  Occupational History   Not on file  Tobacco Use   Smoking status: Never   Smokeless tobacco: Never  Vaping Use   Vaping Use: Never used  Substance and Sexual Activity   Alcohol use: Yes    Alcohol/week: 6.0 standard drinks    Types: 6 Standard drinks or equivalent per week   Drug use: No   Sexual activity: Not Currently    Comment: 1st  intercourse 66 yo-Fewer than 5 partners  Other Topics Concern   Not on file  Social History Narrative   Married. 2 living children, 1 died at birth. 4 grandchildren, 1 step grandchild.    Lives with husband.       Retired 2019   Former teacher-retired.    Prior with [employer]- caps and gowns etc. Stressful boss- no woff      Hobbies: rummy cube (tile game), antiquing, cooking, gardening   Social Determinants of Health   Financial Resource Strain: Low Risk    Difficulty of Paying Living Expenses: Not hard at all  Food Insecurity: No Food Insecurity   Worried About Charity fundraiser in the Last Year: Never true   Ran Out of Food in the Last Year: Never true  Transportation Needs: No Transportation Needs   Lack of Transportation (Medical): No   Lack of Transportation (Non-Medical): No  Physical Activity: Inactive   Days of Exercise per Week: 0 days   Minutes of Exercise per Session: 0 min  Stress: No Stress Concern Present   Feeling of Stress : Not at all  Social Connections: Moderately Isolated   Frequency of Communication with Friends and Family: More than three times a week   Frequency of Social Gatherings with Friends and Family: Three times a week   Attends Religious Services: Never   Active Member of Clubs or Organizations: No   Attends Archivist Meetings: Never   Marital Status: Married    Tobacco Counseling Counseling given: Not Answered   Clinical Intake:  Pre-visit preparation completed: Yes  Pain : No/denies pain     BMI - recorded: 22.94 Nutritional Status: BMI of 19-24  Normal Nutritional Risks: None Diabetes: No  How often do you need to have someone help you when you read instructions, pamphlets, or other written materials from your doctor or pharmacy?: 1 - Never  Diabetic?no  Interpreter Needed?: No  Information entered by :: Charlott Rakes, LPN   Activities of Daily Living    05/20/2022    2:05 PM 06/11/2021    9:51 AM  In  your present state of health, do you have any difficulty performing the following activities:  Hearing? 0 0  Vision? 0 0  Difficulty concentrating or making decisions? 0 0  Walking or climbing stairs? 0 0  Dressing or bathing? 0 0  Doing errands, shopping? 0 0  Preparing Food and eating ? N   Using the Toilet? N   In the past six months, have you accidently leaked urine? N   Do you have problems  with loss of bowel control? N   Managing your Medications? N   Managing your Finances? N   Housekeeping or managing your Housekeeping? N     Patient Care Team: Marin Olp, MD as PCP - General (Family Medicine) Huel Cote, NP (Inactive) as Nurse Practitioner (Obstetrics and Gynecology)  Indicate any recent Medical Services you may have received from other than [facility] providers in the past year (date may be approximate).     Assessment:   This is a routine wellness examination for Sharon Medina.  Hearing/Vision screen Hearing Screening - Comments:: Pt denies any hearing issues  Vision Screening - Comments:: Pt follows up with [facility] for annual eye exams   Dietary issues and exercise activities discussed: Current Exercise Habits: The patient does not participate in regular exercise at present   Goals Addressed             This Visit's Progress    Patient Stated       None at this time        Depression Screen    05/20/2022    2:03 PM 12/25/2021   11:53 AM 06/11/2021    9:51 AM 12/08/2020    1:14 PM 06/05/2020    7:57 AM 12/06/2019   10:32 AM 06/05/2019    7:54 AM  PHQ 2/9 Scores  PHQ - 2 Score 0 1 0 4 0 4 1  PHQ- 9 Score  '3 4 9 1 6 3    '$ Fall Risk    05/20/2022    2:05 PM 06/11/2021    9:51 AM 12/06/2019   10:31 AM  Fall Risk   Falls in the past year? 0 0 0  Number falls in past yr: 0 0 0  Injury with Fall? 0 0 0  Risk for fall due to : Impaired vision No Fall Risks   Follow up Falls prevention discussed Falls evaluation completed     FALL RISK PREVENTION  PERTAINING TO THE HOME:  Any stairs in or around the home? Yes  If so, are there any without handrails? No  Home free of loose throw rugs in walkways, pet beds, electrical cords, etc? Yes  Adequate lighting in your home to reduce risk of falls? Yes   ASSISTIVE DEVICES UTILIZED TO PREVENT FALLS:  Life alert? No  Use of a cane, walker or w/c? No  Grab bars in the bathroom? Yes  Shower chair or bench in shower? Yes  Elevated toilet seat or a handicapped toilet? No   TIMED UP AND GO:  Was the test performed? No .  Cognitive Function:        05/20/2022    2:06 PM  6CIT Screen  What Year? 0 points  What month? 0 points  What time? 0 points  Count back from 20 0 points  Months in reverse 0 points  Repeat phrase 0 points  Total Score 0 points    Immunizations Immunization History  Administered Date(s) Administered   Fluad Quad(high Dose 65+) 10/06/2020   Influenza Split 09/10/2011, 09/18/2012   Influenza Whole 09/07/2010   Influenza,inj,Quad PF,6+ Mos 09/24/2013, 09/25/2014, 10/16/2015, 09/17/2016, 08/17/2017, 11/06/2018, 08/22/2019, 10/02/2021   Influenza,inj,quad, With Preservative 11/06/2018   Influenza-Unspecified 08/17/2017, 08/22/2019   Moderna Covid-19 Vaccine Bivalent Booster 12yr & up 07/16/2021, 10/02/2021   Moderna Sars-Covid-2 Vaccination 02/24/2020, 03/23/2020, 10/16/2020   PNEUMOCOCCAL CONJUGATE-20 06/11/2021   PPD Test 04/10/2012   Td 12/20/2000   Tdap 09/18/2012   Zoster Recombinat (Shingrix) 12/08/2020, 04/09/2021  TDAP status: Up to date  Flu Vaccine status: Up to date  Pneumococcal vaccine status: Up to date  Covid-19 vaccine status: Completed vaccines  Qualifies for Shingles Vaccine? Yes   Zostavax completed Yes   Shingrix Completed?: Yes  Screening Tests Health Maintenance  Topic Date Due   COLONOSCOPY (Pts 45-70yr Insurance coverage will need to be confirmed)  07/20/2021   INFLUENZA VACCINE  07/20/2022   TETANUS/TDAP  09/18/2022    PAP SMEAR-Modifier  12/05/2022   DEXA SCAN  03/31/2024   MAMMOGRAM  04/02/2024   Pneumonia Vaccine 66 Years old  Completed   COVID-19 Vaccine  Completed   Hepatitis C Screening  Completed   HIV Screening  Completed   Zoster Vaccines- Shingrix  Completed   HPV VACCINES  Aged Out    Health Maintenance  Health Maintenance Due  Topic Date Due   COLONOSCOPY (Pts 45-423yrInsurance coverage will need to be confirmed)  07/20/2021    Colorectal cancer screening: Referral to GI placed 05/20/22. Pt aware the office will call re: appt.  Mammogram status: Completed 04/02/22. Repeat every year  Bone Density status: Completed 03/31/21. Results reflect: Bone density results: OSTEOPENIA. Repeat every 3 years.   Additional Screening:  Hepatitis C Screening:  Completed 12/02/17  Vision Screening: Recommended annual ophthalmology exams for early detection of glaucoma and other disorders of the eye. Is the patient up to date with their annual eye exam?  Yes  Who is the provider or what is the name of the office in which the patient attends annual eye exams? [facility]  If pt is not established with a provider, would they like to be referred to a provider to establish care? No .   Dental Screening: Recommended annual dental exams for proper oral hygiene  Community Resource Referral / Chronic Care Management: CRR required this visit?  No   CCM required this visit?  No      Plan:     I have personally reviewed and noted the following in the patient's chart:   Medical and social history Use of alcohol, tobacco or illicit drugs  Current medications and supplements including opioid prescriptions. Patient is not currently taking opioid prescriptions. Functional ability and status Nutritional status Physical activity Advanced directives List of other physicians Hospitalizations, surgeries, and ER visits in previous 12 months Vitals Screenings to include cognitive, depression, and  falls Referrals and appointments  In addition, I have reviewed and discussed with patient certain preventive protocols, quality metrics, and best practice recommendations. A written personalized care plan for preventive services as well as general preventive health recommendations were provided to patient.     TiWillette BraceLPN   6/04/23/9740 Nurse Notes: None

## 2022-05-26 DIAGNOSIS — H35033 Hypertensive retinopathy, bilateral: Secondary | ICD-10-CM | POA: Diagnosis not present

## 2022-05-26 DIAGNOSIS — H35363 Drusen (degenerative) of macula, bilateral: Secondary | ICD-10-CM | POA: Diagnosis not present

## 2022-06-25 ENCOUNTER — Encounter: Payer: Self-pay | Admitting: Family Medicine

## 2022-06-25 ENCOUNTER — Ambulatory Visit: Payer: Medicare PPO | Admitting: Family Medicine

## 2022-06-25 VITALS — BP 108/74 | HR 64 | Temp 98.4°F | Ht 65.5 in | Wt 144.0 lb

## 2022-06-25 DIAGNOSIS — I10 Essential (primary) hypertension: Secondary | ICD-10-CM | POA: Diagnosis not present

## 2022-06-25 DIAGNOSIS — F3342 Major depressive disorder, recurrent, in full remission: Secondary | ICD-10-CM | POA: Diagnosis not present

## 2022-06-25 DIAGNOSIS — F411 Generalized anxiety disorder: Secondary | ICD-10-CM | POA: Diagnosis not present

## 2022-06-25 DIAGNOSIS — E785 Hyperlipidemia, unspecified: Secondary | ICD-10-CM | POA: Diagnosis not present

## 2022-06-25 MED ORDER — SERTRALINE HCL 50 MG PO TABS: 50.0000 mg | ORAL_TABLET | Freq: Every day | ORAL | 3 refills | Status: DC

## 2022-06-25 NOTE — Progress Notes (Signed)
Phone [phone] In person visit   Subjective:   Sharon Medina is a 65 y.o. year old very pleasant female patient who presents for/with See problem oriented charting Chief Complaint  Patient presents with   Follow-up   Hypertension   Depression   Past Medical History-  Patient Active Problem List   Diagnosis Date Noted   Hyperlipidemia, unspecified 06/05/2020    Priority: Medium    Osteopenia 12/02/2017    Priority: Medium    GAD (generalized anxiety disorder) 12/02/2017    Priority: Medium    Major depression in full remission ([place]) 08/24/2007    Priority: Medium    Essential hypertension 08/24/2007    Priority: Medium    History of colonic polyps 06/13/2008    Priority: Low   Allergic rhinitis 08/24/2007    Priority: Low    Medications- reviewed and updated Current Outpatient Medications  Medication Sig Dispense Refill   amLODipine-valsartan (EXFORGE) 5-320 MG tablet TAKE 1 TABLET BY MOUTH DAILY 90 tablet 3   atenolol (TENORMIN) 50 MG tablet TAKE 1 TABLET BY MOUTH EVERY DAY 90 tablet 1   buPROPion (WELLBUTRIN XL) 150 MG 24 hr tablet TAKE 2 TABLETS BY MOUTH EVERY DAY 180 tablet 3   Calcium Carbonate-Vitamin D (CALCIUM-D PO) Take by mouth.     Coenzyme Q10 (CO Q 10) 10 MG CAPS Take 1 tablet by mouth daily.     Estradiol 10 MCG TABS vaginal tablet INSERT 1 TABLET VAGINALLY 2 TIMES A WEEK 24 tablet 4   fluorouracil (EFUDEX) 5 % cream SMARTSIG:Sparingly Topical Twice Daily     fluticasone (FLONASE) 50 MCG/ACT nasal spray SHAKE LIQUID AND USE 2 SPRAYS IN EACH NOSTRIL DAILY 48 g 1   glucosamine-chondroitin 500-400 MG tablet Take 1 tablet by mouth 2 (two) times daily as needed.     Menaquinone-7 (VITAMIN K2 PO) Take by mouth.     metroNIDAZOLE (METROCREAM) 0.75 % cream Apply topically 2 (two) times daily.     sertraline (ZOLOFT) 50 MG tablet Take 1 tablet (50 mg total) by mouth daily. 90 tablet 3   No current facility-administered medications for this visit.      Objective:  BP 108/74   Pulse 64   Temp 98.4 F (36.9 C)   Ht 5' 5.5" (1.664 m)   Wt 144 lb (65.3 kg)   LMP 10/02/2014   SpO2 97%   BMI 23.60 kg/m  Gen: NAD, resting comfortably TM normal, oropharynx normal CV: RRR no murmurs rubs or gallops Lungs: CTAB no crackles, wheeze, rhonchi Ext: no edema Skin: warm, dry Neuro: grossly normal, moves all extremities     Assessment and Plan   #hypertension S: medication: Atenolol 50 mg daily, amlodipine-valsartan 5-320 mg daily -Off losartan-had issues with cough/choking sensation Home readings #s: healthy readings BP Readings from Last 3 Encounters:  06/25/22 108/74  02/15/22 124/82  12/25/21 122/80  A/P: Controlled. Continue current medications.   #hyperlipidemia S: Medication:none  -No first-degree relatives with CAD and ASCVD risk under 7.5% A/P: The 10-year ASCVD risk score (Arnett DK, et al., 2019) is: 5.1% Mild elevation- focus on healthy eating and regular exercise (some walking/gardening)   # Depression-recurrent S: Medication:Wellbutrin 150 mg extended release-takes 2 tablets daily, sertraline 25 mg A/P: phq9 still at 5 and she states most of it is situational and short lived- does have some anxiety issues caring for her husband   #Hearing loss and right ear fullness-referred to ENT at last visit for possible OME and encouraged trial of  Flonase while waiting - she states sinuses were better with flonase- now just using as needed (if sinuses flare up)   - saw Dr. Benjamine Mola- hearing loss was mild age related- has follow up in 6 months- still with some eustachian tube dysfunction (causing cracking- auto insufflatoin helps)  #traveling soon- planning to go ahead and get covid booster  Recommended follow up: Return in about 6 months (around 12/26/2022) for physical or sooner if needed.Schedule b4 you leave. Future Appointments  Date Time Provider [place]  05/26/2023  2:15 PM [facility]    Lab/Order associations:   ICD-10-CM   1. Essential hypertension  I10     2. Hyperlipidemia, unspecified hyperlipidemia type  E78.5     3. Recurrent major depressive disorder, in full remission ([place])  F33.42     4. GAD (generalized anxiety disorder)  F41.1       Meds ordered this encounter  Medications   sertraline (ZOLOFT) 50 MG tablet    Sig: Take 1 tablet (50 mg total) by mouth daily.    Dispense:  90 tablet    Refill:  3    Return precautions advised.  Garret Reddish, MD

## 2022-06-25 NOTE — Patient Instructions (Addendum)
Health Maintenance Due  Topic Date Due   COLONOSCOPY (Pts 45-18yr Insurance coverage will need to be confirmed)  07/20/2021  They have bene somewhat backed up- lets have you give them a call [facility] contact Please call to schedule visit and/or procedure [address] [place] [id number]: [phone]  With all of your stressors lets try sertraline 50 mg and follow up in 6 months or sooner if you need [place]- if any thoughts of self harm or if worsening symptoms contact [place]mediately  Recommended follow up: Return in about 6 months (around 12/26/2022) for physical or sooner if needed.Schedule b4 you leave.

## 2022-08-20 ENCOUNTER — Ambulatory Visit (AMBULATORY_SURGERY_CENTER): Payer: Self-pay | Admitting: *Deleted

## 2022-08-20 VITALS — Ht 66.0 in | Wt 143.4 lb

## 2022-08-20 DIAGNOSIS — Z800 Family history of malignant neoplasm of digestive organs: Secondary | ICD-10-CM

## 2022-08-20 DIAGNOSIS — Z86010 Personal history of colonic polyps: Secondary | ICD-10-CM

## 2022-08-20 MED ORDER — NA SULFATE-K SULFATE-MG SULF 17.5-3.13-1.6 GM/177ML PO SOLN: 1.0000 | Freq: Once | ORAL | 0 refills | Status: AC

## 2022-08-20 NOTE — Procedures (Signed)
No egg or soy allergy known to patient  No issues known to pt with past sedation with any surgeries or procedures Patient denies ever being told they had issues or difficulty with intubation  No FH of Malignant Hyperthermia Pt is not on diet pills Pt is not on home 02  Pt is not on blood thinners  Pt denies issues with constipation  No A fib or A flutter Have any cardiac testing pending--NO Pt instructed to use [email or web address] or GoodRx for a price reduction on prep   

## 2022-08-30 ENCOUNTER — Encounter: Payer: Self-pay | Admitting: Gastroenterology

## 2022-09-13 ENCOUNTER — Encounter: Payer: Self-pay | Admitting: Gastroenterology

## 2022-09-13 ENCOUNTER — Encounter: Payer: Self-pay | Admitting: *Deleted

## 2022-09-13 ENCOUNTER — Ambulatory Visit (AMBULATORY_SURGERY_CENTER): Payer: Medicare PPO | Admitting: Gastroenterology

## 2022-09-13 VITALS — BP 129/76 | HR 61 | Temp 98.0°F | Resp 11

## 2022-09-13 DIAGNOSIS — Z86010 Personal history of colonic polyps: Secondary | ICD-10-CM | POA: Diagnosis not present

## 2022-09-13 DIAGNOSIS — D128 Benign neoplasm of rectum: Secondary | ICD-10-CM | POA: Diagnosis not present

## 2022-09-13 DIAGNOSIS — Z09 Encounter for follow-up examination after completed treatment for conditions other than malignant neoplasm: Secondary | ICD-10-CM

## 2022-09-13 DIAGNOSIS — D123 Benign neoplasm of transverse colon: Secondary | ICD-10-CM | POA: Diagnosis not present

## 2022-09-13 DIAGNOSIS — Z800 Family history of malignant neoplasm of digestive organs: Secondary | ICD-10-CM | POA: Diagnosis not present

## 2022-09-13 DIAGNOSIS — D122 Benign neoplasm of ascending colon: Secondary | ICD-10-CM

## 2022-09-13 DIAGNOSIS — K621 Rectal polyp: Secondary | ICD-10-CM | POA: Diagnosis not present

## 2022-09-13 MED ORDER — SODIUM CHLORIDE 0.9 % IV SOLN: 500.0000 mL | Freq: Once | INTRAVENOUS | Status: DC

## 2022-09-13 NOTE — Patient Instructions (Signed)
Handouts provided on polyps, diverticulosis and hemorrhoids.   Recommend a high-fiber diet (see handout).   Await pathology results. Continue present medications.   YOU HAD AN ENDOSCOPIC PROCEDURE TODAY AT [facility]:   Refer to the procedure report that was given to you for any specific questions about what was found during the examination.  If the procedure report does not answer your questions, please call your gastroenterologist to clarify.  If you requested that your care partner not be given the details of your procedure findings, then the procedure report has been included in a sealed envelope for you to review at your convenience later.  YOU SHOULD EXPECT: Some feelings of bloating in the abdomen. Passage of more gas than usual.  Walking can help get rid of the air that was put into your GI tract during the procedure and reduce the bloating. If you had a lower endoscopy (such as a colonoscopy or flexible sigmoidoscopy) you may notice spotting of blood in your stool or on the toilet paper. If you underwent a bowel prep for your procedure, you may not have a normal bowel movement for a few days.  Please Note:  You might notice some irritation and congestion in your nose or some drainage.  This is from the oxygen used during your procedure.  There is no need for concern and it should clear up in a day or so.  SYMPTOMS TO REPORT IMMEDIATELY:  Following lower endoscopy (colonoscopy or flexible sigmoidoscopy):  Excessive amounts of blood in the stool  Significant tenderness or worsening of abdominal pains  Swelling of the abdomen that is new, acute  Fever of 100F or higher  For urgent or emergent issues, a gastroenterologist can be reached at any hour by calling [phone]. Do not use MyChart messaging for urgent concerns.    DIET:  We do recommend a small meal at first, but then you may proceed to your regular diet.  Drink plenty of fluids but you should avoid  alcoholic beverages for 24 hours.  ACTIVITY:  You should plan to take it easy for the rest of today and you should NOT DRIVE or use heavy machinery until tomorrow (because of the sedation medicines used during the test).    FOLLOW UP: Our staff will call the number listed on your records the next business day following your procedure.  We will call around 7:15- 8:00 am to check on you and address any questions or concerns that you may have regarding the information given to you following your procedure. If we do not reach you, we will leave a message.     If any biopsies were taken you will be contacted by phone or by letter within the next 1-3 weeks.  Please call us at [phone] if you have not heard about the biopsies in 3 weeks.    SIGNATURES/CONFIDENTIALITY: You and/or your care partner have signed paperwork which will be entered into your electronic medical record.  These signatures attest to the fact that that the information above on your After Visit Summary has been reviewed and is understood.  Full responsibility of the confidentiality of this discharge information lies with you and/or your care-partner.

## 2022-09-13 NOTE — Progress Notes (Signed)
A and O x3. Report to RN. Tolerated MAC anesthesia well. 

## 2022-09-13 NOTE — Progress Notes (Signed)
History & Physical  Primary Care Physician:  Marin Olp, MD Primary Gastroenterologist: Lucio Edward, MD  CHIEF COMPLAINT:  Personal history of colon polyps, [facility]   HPI: Sharon Medina is a 66 y.o. adult with a personal history of adenomatous colon polyps, family history of colon polyps, first-degree relative, and family history of colon cancer, 2 second-degree relatives, for colonoscopy.   Past Medical History:  Diagnosis Date   Allergy    SEASONAL   Arthritis    HANDS,FOOT -LEFT BIG TOE   Cataract    BILATERAL,SMALL   Claustrophobia    severe   Depression    H/O seasonal allergies    Hemorrhoids    Hypertension    Osteopenia    Postmenopausal HRT (hormone replacement therapy)    per pt, never took oral HR meds    Past Surgical History:  Procedure Laterality Date   CESAREAN SECTION     x2   COLONOSCOPY     POLYPECTOMY     WISDOM TOOTH EXTRACTION      Prior to Admission medications   Medication Sig Start Date End Date Taking? Authorizing Provider  amLODipine-valsartan (EXFORGE) 5-320 MG tablet TAKE 1 TABLET BY MOUTH DAILY 12/07/21  Yes Marin Olp, MD  atenolol (TENORMIN) 50 MG tablet TAKE 1 TABLET BY MOUTH EVERY DAY 04/20/22  Yes Marin Olp, MD  buPROPion (WELLBUTRIN XL) 150 MG 24 hr tablet TAKE 2 TABLETS BY MOUTH EVERY DAY 01/20/22  Yes Marin Olp, MD  Calcium Carbonate-Vitamin D (CALCIUM-D PO) Take by mouth.   Yes [provider]  Coenzyme Q10 (CO Q 10) 10 MG CAPS Take 1 tablet by mouth daily.   Yes [provider]  Estradiol 10 MCG TABS vaginal tablet INSERT 1 TABLET VAGINALLY 2 TIMES A WEEK 02/15/22  Yes Juleen China, Tiffany A, NP  fluticasone (FLONASE) 50 MCG/ACT nasal spray SHAKE LIQUID AND USE 2 SPRAYS IN EACH NOSTRIL DAILY 12/25/21  Yes Marin Olp, MD  glucosamine-chondroitin 500-400 MG tablet Take 1 tablet by mouth 2 (two) times daily as needed.   Yes [provider]  Menaquinone-7 (VITAMIN K2 PO)  Take by mouth daily.   Yes [provider]  sertraline (ZOLOFT) 50 MG tablet Take 1 tablet (50 mg total) by mouth daily. 06/25/22  Yes Marin Olp, MD    Current Outpatient Medications  Medication Sig Dispense Refill   amLODipine-valsartan (EXFORGE) 5-320 MG tablet TAKE 1 TABLET BY MOUTH DAILY 90 tablet 3   atenolol (TENORMIN) 50 MG tablet TAKE 1 TABLET BY MOUTH EVERY DAY 90 tablet 1   buPROPion (WELLBUTRIN XL) 150 MG 24 hr tablet TAKE 2 TABLETS BY MOUTH EVERY DAY 180 tablet 3   Calcium Carbonate-Vitamin D (CALCIUM-D PO) Take by mouth.     Coenzyme Q10 (CO Q 10) 10 MG CAPS Take 1 tablet by mouth daily.     Estradiol 10 MCG TABS vaginal tablet INSERT 1 TABLET VAGINALLY 2 TIMES A WEEK 24 tablet 4   fluticasone (FLONASE) 50 MCG/ACT nasal spray SHAKE LIQUID AND USE 2 SPRAYS IN EACH NOSTRIL DAILY 48 g 1   glucosamine-chondroitin 500-400 MG tablet Take 1 tablet by mouth 2 (two) times daily as needed.     Menaquinone-7 (VITAMIN K2 PO) Take by mouth daily.     sertraline (ZOLOFT) 50 MG tablet Take 1 tablet (50 mg total) by mouth daily. 90 tablet 3   Current Facility-Administered Medications  Medication Dose Route Frequency Provider Last Rate Last Admin  0.9 %  sodium chloride infusion  500 mL Intravenous Once Ladene Artist, MD        Allergies as of 09/13/2022 - Review Complete 09/13/2022  Allergen Reaction Noted   Hydrochlorothiazide  11/18/2008    Family History  Problem Relation Age of Onset   Hypertension Mother        sister as well   Prostate cancer Father        smoker   Hypertension Father    Ulcers Father    Colon polyps Sister    Gout Sister    Hypertension Sister    Gout Sister        brother   Gout Brother    Heart disease Brother    Colon cancer Paternal Aunt    Colon cancer Paternal Aunt    Crohn's disease Neg Hx    Esophageal cancer Neg Hx    Rectal cancer Neg Hx    Stomach cancer Neg Hx    Ulcerative colitis Neg Hx     Social History    Socioeconomic History   Marital status: Married    Spouse name: Not on file   Number of children: Not on file   Years of education: Not on file   Highest education level: Not on file  Occupational History   Not on file  Tobacco Use   Smoking status: Never    Passive exposure: Past (BOTH PARENTS SMOKED)   Smokeless tobacco: Never  Vaping Use   Vaping Use: Never used  Substance and Sexual Activity   Alcohol use: Yes    Alcohol/week: 6.0 standard drinks of alcohol    Types: 6 Standard drinks or equivalent per week    Comment: WINE 2-3 X A WEEK   Drug use: No   Sexual activity: Not Currently    Comment: 1st intercourse 66 yo-Fewer than 5 partners  Other Topics Concern   Not on file  Social History Narrative   Married. 2 living children, 1 died at birth. 4 grandchildren, 1 step grandchild.    Lives with husband.       Retired 2019   Former teacher-retired.    Prior with [employer]- caps and gowns etc. Stressful boss- no woff      Hobbies: rummy cube (tile game), antiquing, cooking, gardening   Social Determinants of Health   Financial Resource Strain: Low Risk  (05/20/2022)   Overall Financial Resource Strain (CARDIA)    Difficulty of Paying Living Expenses: Not hard at all  Food Insecurity: No Food Insecurity (05/20/2022)   Hunger Vital Sign    Worried About Running Out of Food in the Last Year: Never true    Ran Out of Food in the Last Year: Never true  Transportation Needs: No Transportation Needs (05/20/2022)   PRAPARE - Hydrologist (Medical): No    Lack of Transportation (Non-Medical): No  Physical Activity: Inactive (05/20/2022)   Exercise Vital Sign    Days of Exercise per Week: 0 days    Minutes of Exercise per Session: 0 min  Stress: No Stress Concern Present (05/20/2022)   [place]    Feeling of Stress : Not at all  Social Connections: Moderately Isolated (05/20/2022)    Social Connection and Isolation Panel [NHANES]    Frequency of Communication with Friends and Family: More than three times a week    Frequency of Social Gatherings with Friends and Family: Three times  a week    Attends Religious Services: Never    Active Member of Clubs or Organizations: No    Attends Archivist Meetings: Never    Marital Status: Married  Human resources officer Violence: Not At Risk (05/20/2022)   Humiliation, Afraid, Rape, and Kick questionnaire    Fear of Current or Ex-Partner: No    Emotionally Abused: No    Physically Abused: No    Sexually Abused: No    Review of Systems:  All systems reviewed were negative except where noted in HPI.   Physical Exam: General:  Alert, well-developed, in NAD Head:  Normocephalic and atraumatic. Eyes:  Sclera clear, no icterus.   Conjunctiva pink. Ears:  Normal auditory acuity. Mouth:  No deformity or lesions.  Neck:  Supple; no masses . Lungs:  Clear throughout to auscultation.   No wheezes, crackles, or rhonchi. No acute distress. Heart:  Regular rate and rhythm; no murmurs. Abdomen:  Soft, nondistended, nontender. No masses, hepatomegaly. No obvious masses.  Normal bowel .    Rectal:  Deferred   Msk:  Symmetrical without gross deformities.. Pulses:  Normal pulses noted. Extremities:  Without edema. Neurologic:  Alert and  oriented x4;  grossly normal neurologically. Skin:  Intact without significant lesions or rashes. Cervical Nodes:  No significant cervical adenopathy. Psych:  Alert and cooperative. Normal mood and affect.   Impression / Plan:   Personal history of adenomatous colon polyps, family history of colon polyps, first-degree relative, and family history of colon cancer, 2 second-degree relatives, for colonoscopy.  Sharon Medina. Fuller Plan  09/13/2022, 10:28 AM See Shea Evans, [facility], to contact our on call provider

## 2022-09-13 NOTE — Progress Notes (Signed)
Vitals-JF  Pt's states no medical or surgical changes since previsit or office visit.

## 2022-09-13 NOTE — Op Note (Signed)
[place] Patient Name: Sharon Medina Procedure Date: 09/13/2022 10:30 AM MRN: [id number] Endoscopist: Ladene Artist , MD Age: 66 Referring MD:  Date of Birth: 08-13-1956 Gender: Female Account #: [id number] Procedure:                Colonoscopy Indications:              Surveillance: Personal history of adenomatous                            polyps on last colonoscopy > 5 years ago, Family                            history of colon cancer, multiple 2nd-degree                            relatives Medicines:                Monitored Anesthesia Care Procedure:                Pre-Anesthesia Assessment:                           - Prior to the procedure, a History and Physical                            was performed, and patient medications and                            allergies were reviewed. The patient's tolerance of                            previous anesthesia was also reviewed. The risks                            and benefits of the procedure and the sedation                            options and risks were discussed with the patient.                            All questions were answered, and informed consent                            was obtained. Prior Anticoagulants: The patient has                            taken no previous anticoagulant or antiplatelet                            agents. ASA Grade Assessment: II - A patient with                            mild systemic disease. After reviewing the risks  and benefits, the patient was deemed in                            satisfactory condition to undergo the procedure.                           After obtaining informed consent, the colonoscope                            was passed under direct vision. Throughout the                            procedure, the patient's blood pressure, pulse, and                            oxygen saturations were monitored continuously. The                             [id number] PCF-H190TL Slim SB Colonoscope was introduced                            through the anus and advanced to the the cecum,                            identified by appendiceal orifice and ileocecal                            valve. The ileocecal valve, appendiceal orifice,                            and rectum were photographed. The quality of the                            bowel preparation was good. The colonoscopy was                            performed without difficulty. The patient tolerated                            the procedure well. Scope In: 10:42:11 AM Scope Out: 11:02:39 AM Scope Withdrawal Time: 0 hours 15 minutes 20 seconds  Total Procedure Duration: 0 hours 20 minutes 28 seconds  Findings:                 The perianal and digital rectal examinations were                            normal.                           Two sessile polyps were found in the ascending                            colon. The polyps were 5 to 10 mm in size. These  polyps were removed with a cold snare. Resection                            and retrieval were complete.                           There was a medium-sized lipoma, 15 mm in diameter,                            in the distal transverse colon. Biopsies were taken                            with a cold forceps for histology.                           Multiple medium-mouthed diverticula were found in                            the entire colon. There was no evidence of                            diverticular bleeding.                           A 4 mm polyp was found in the rectum. The polyp was                            sessile. The polyp was removed with a cold biopsy                            forceps. Resection and retrieval were complete.                           Internal hemorrhoids were found during                            retroflexion. The hemorrhoids were small and Grade                             I (internal hemorrhoids that do not prolapse).                           The exam was otherwise without abnormality on                            direct and retroflexion views. Complications:            No immediate complications. Estimated blood loss:                            None. Estimated Blood Loss:     Estimated blood loss: none. Impression:               - Two 5 to 10 mm polyps in the ascending colon,  removed with a cold snare. Resected and retrieved.                           - Medium-sized lipoma in the distal transverse                            colon. Biopsied.                           - Moderate diverticulosis in the entire examined                            colon.                           - One 4 mm polyp in the rectum, removed with a cold                            biopsy forceps. Resected and retrieved                           - Internal hemorrhoids.                           - The examination was otherwise normal on direct                            and retroflexion views. Recommendation:           - Repeat colonoscopy after studies are complete for                            surveillance based on pathology results.                           - Patient has a contact number available for                            emergencies. The signs and symptoms of potential                            delayed complications were discussed with the                            patient. Return to normal activities tomorrow.                            Written discharge instructions were provided to the                            patient.                           - High fiber diet.                           - Continue present  medications.                           - Await pathology results. Ladene Artist, MD 09/13/2022 11:07:45 AM This report has been signed electronically.

## 2022-09-13 NOTE — Progress Notes (Signed)
Called to room to assist during endoscopic procedure.  Patient ID and intended procedure confirmed with present staff. Received instructions for my participation in the procedure from the performing physician.  

## 2022-09-14 ENCOUNTER — Telehealth: Payer: Self-pay

## 2022-09-14 NOTE — Telephone Encounter (Signed)
  Follow up Call-     09/13/2022    9:38 AM 09/13/2022    9:34 AM  Call back number  Post procedure Call Back phone  # [phone]   Permission to leave phone message  Yes     Patient questions:  Do you have a fever, pain , or abdominal swelling? No. Pain Score  0 *  Have you tolerated food without any problems? Yes.    Have you been able to return to your normal activities? Yes.    Do you have any questions about your discharge instructions: Diet   No. Medications  No. Follow up visit  No.  Do you have questions or concerns about your Care? No.  Actions: * If pain score is 4 or above: No action needed, pain <4.

## 2022-09-29 ENCOUNTER — Encounter: Payer: Self-pay | Admitting: Gastroenterology

## 2022-09-30 ENCOUNTER — Telehealth: Payer: Self-pay

## 2022-09-30 NOTE — Telephone Encounter (Signed)
Patient called stating she needed refill on her Vagifem that pharmacy said it had run out.  I called and spoke with pharmacist because Rx was sent for the year 02/15/2022.  He research it and said someone had closed the Rx in error. She still has #104 tabs remaining on that Rx. He will "drop it again" and fix it and run it for her and get it ready.  Spoke with patient and informed her.

## 2022-10-05 DIAGNOSIS — H6983 Other specified disorders of Eustachian tube, bilateral: Secondary | ICD-10-CM | POA: Diagnosis not present

## 2022-10-05 DIAGNOSIS — J342 Deviated nasal septum: Secondary | ICD-10-CM | POA: Diagnosis not present

## 2022-10-05 DIAGNOSIS — H903 Sensorineural hearing loss, bilateral: Secondary | ICD-10-CM | POA: Diagnosis not present

## 2022-10-05 DIAGNOSIS — J343 Hypertrophy of nasal turbinates: Secondary | ICD-10-CM | POA: Diagnosis not present

## 2022-10-05 DIAGNOSIS — J310 Chronic rhinitis: Secondary | ICD-10-CM | POA: Diagnosis not present

## 2022-10-17 ENCOUNTER — Other Ambulatory Visit: Payer: Self-pay | Admitting: Family Medicine

## 2022-11-10 IMAGING — MG MM DIGITAL SCREENING BILAT W/ TOMO AND CAD
8 series · 9 of 24 positions shown · non-contrast
Comparison: Previous exam(s).

CLINICAL DATA: Screening.

EXAM:
DIGITAL SCREENING BILATERAL MAMMOGRAM WITH TOMOSYNTHESIS AND CAD
TECHNIQUE: Bilateral screening digital craniocaudal and mediolateral oblique
mammograms were obtained. Bilateral screening digital breast
tomosynthesis was performed. The images were evaluated with
computer-aided detection.

[L CC synth-2D]
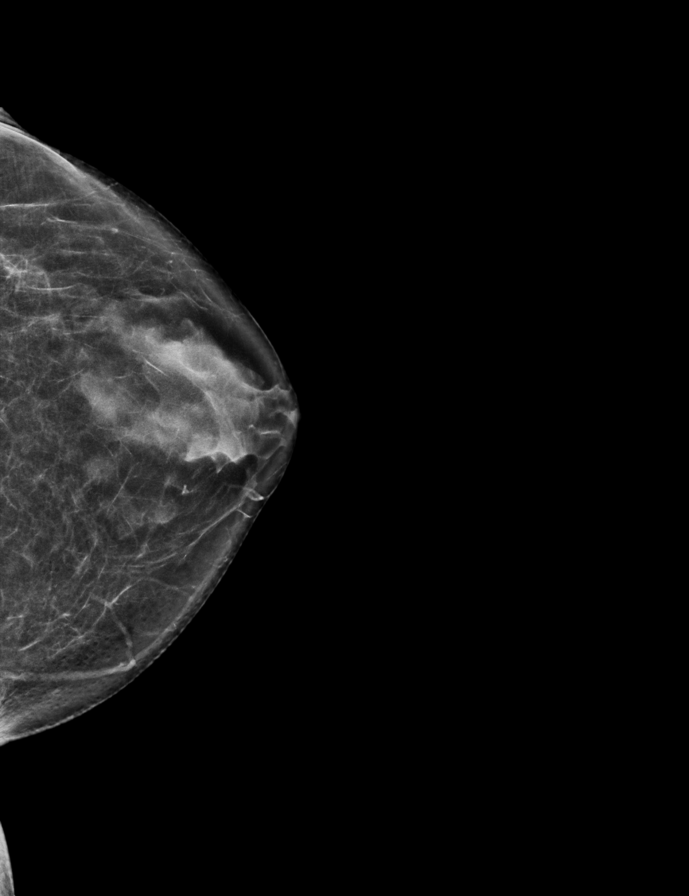

[R MLO synth-2D]
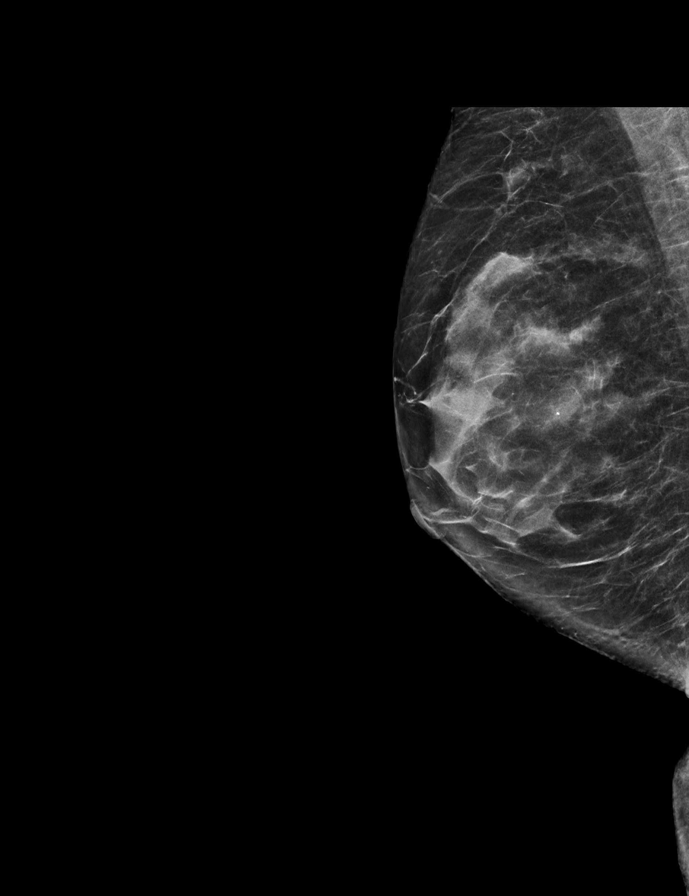

[R CC synth-2D]
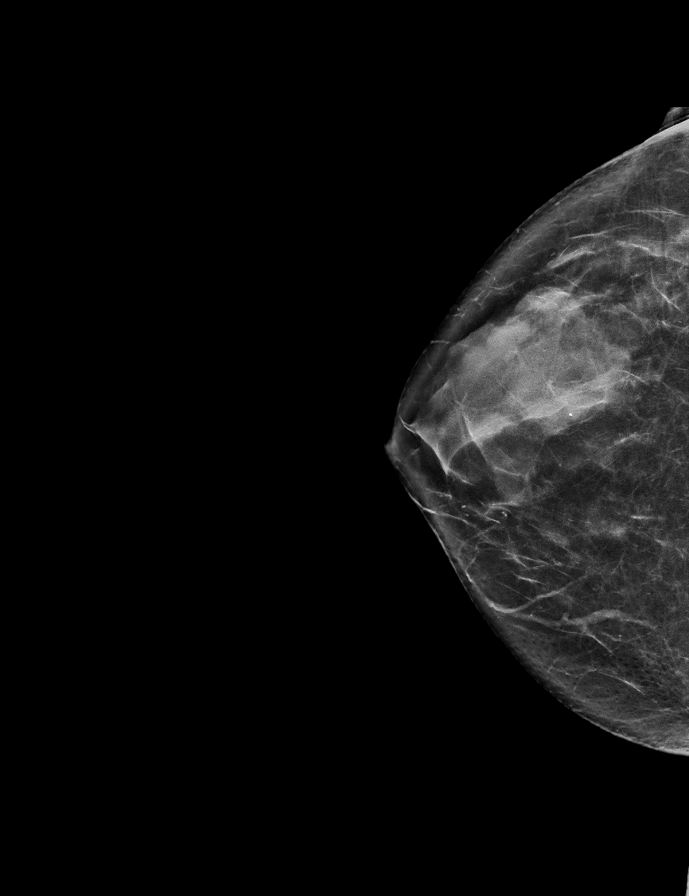

[L MLO synth-2D]
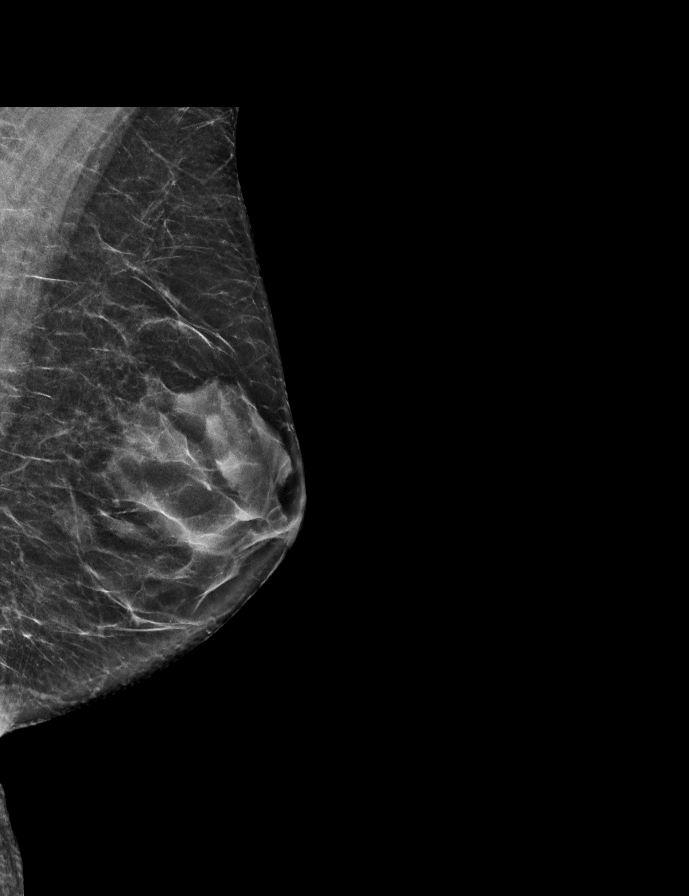

[L MLO tomo · 2 of 60 frames shown]
[frame 20/60]
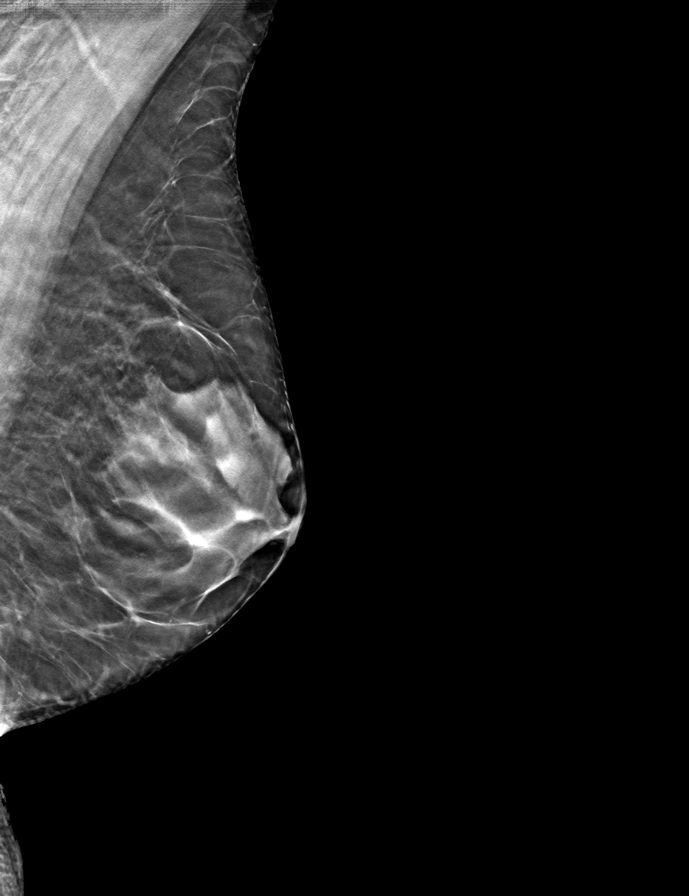
[frame 31/60]
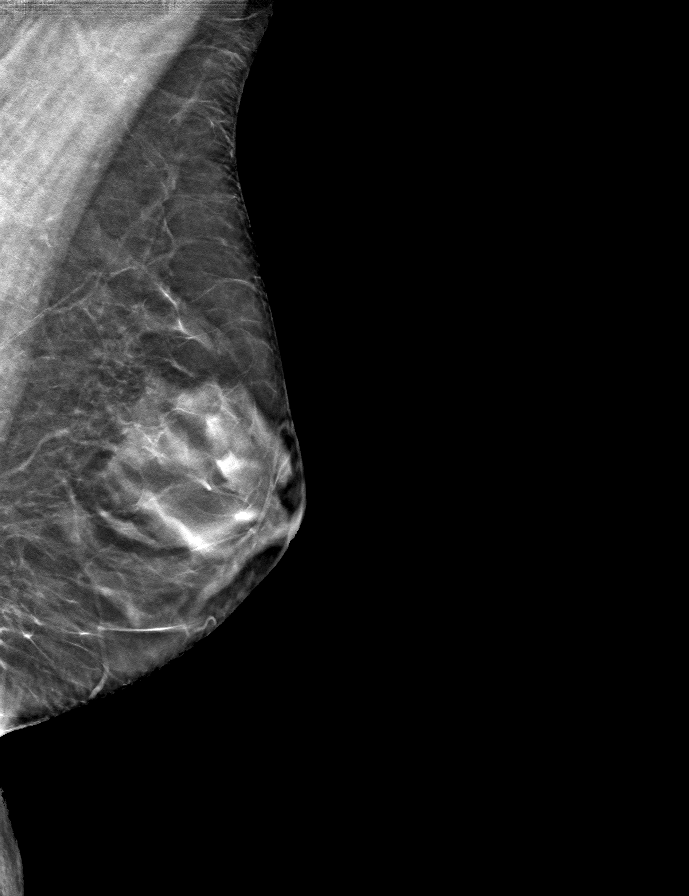

[R CC tomo · tomo slice 32/63.0]
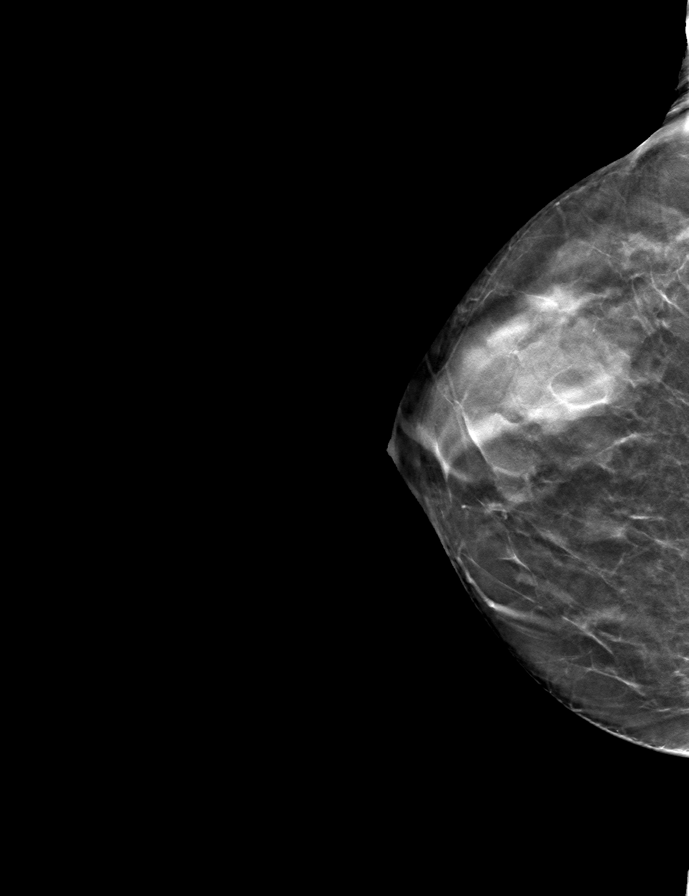

[R MLO tomo · tomo slice 31/61.0]
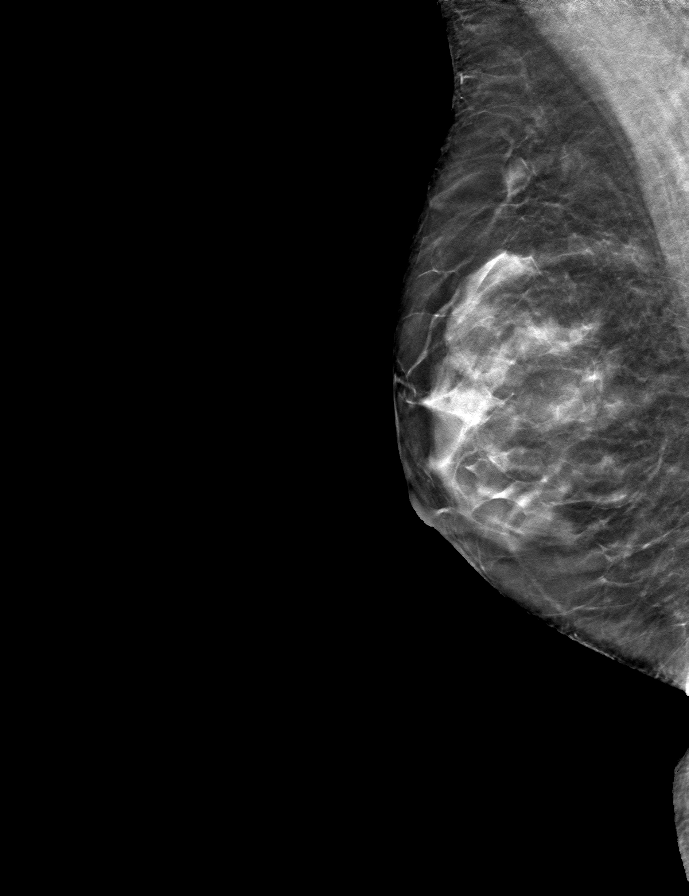

[L CC tomo · tomo slice 31/61.0]
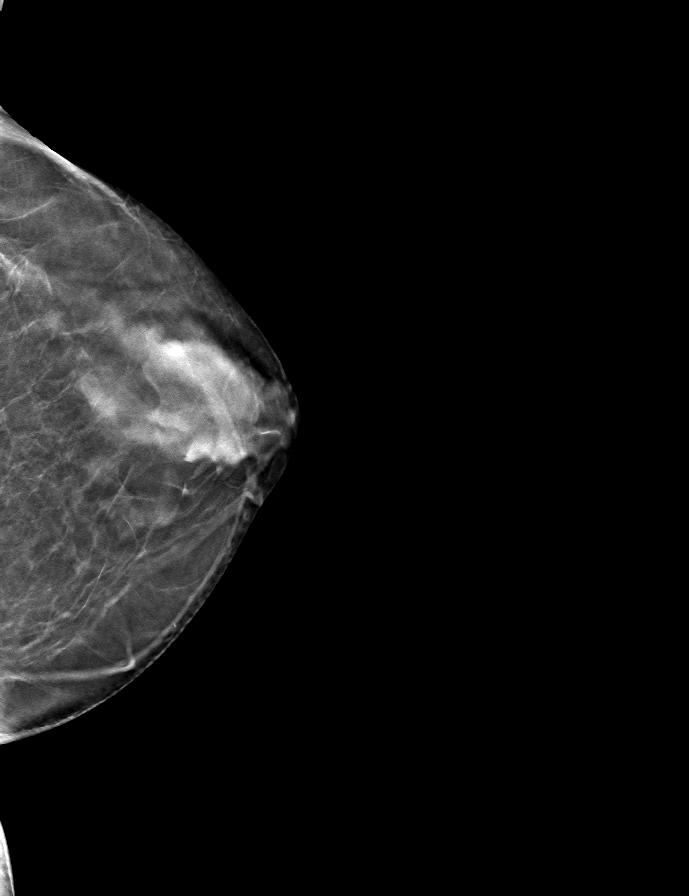

[9 of 24 positions shown; findings below may reference images not displayed]

ACR Breast Density Category c: The breast tissue is heterogeneously
dense, which may obscure small masses.
FINDINGS: There are no findings suspicious for malignancy.
IMPRESSION: No mammographic evidence of malignancy. A result letter of this
screening mammogram will be mailed directly to the patient.

RECOMMENDATION:
Screening mammogram in one year. (Code:[id number])

BI-RADS CATEGORY  1: Negative.

## 2022-12-02 ENCOUNTER — Encounter: Payer: Self-pay | Admitting: *Deleted

## 2022-12-08 ENCOUNTER — Other Ambulatory Visit: Payer: Self-pay | Admitting: Family Medicine

## 2022-12-30 ENCOUNTER — Encounter: Payer: Self-pay | Admitting: Family Medicine

## 2022-12-30 ENCOUNTER — Ambulatory Visit (INDEPENDENT_AMBULATORY_CARE_PROVIDER_SITE_OTHER): Payer: Medicare PPO | Admitting: Family Medicine

## 2022-12-30 VITALS — BP 122/74 | HR 70 | Temp 98.1°F | Ht 66.0 in | Wt 142.8 lb

## 2022-12-30 DIAGNOSIS — Z0000 Encounter for general adult medical examination without abnormal findings: Secondary | ICD-10-CM

## 2022-12-30 DIAGNOSIS — F3342 Major depressive disorder, recurrent, in full remission: Secondary | ICD-10-CM | POA: Diagnosis not present

## 2022-12-30 DIAGNOSIS — E785 Hyperlipidemia, unspecified: Secondary | ICD-10-CM | POA: Diagnosis not present

## 2022-12-30 DIAGNOSIS — I10 Essential (primary) hypertension: Secondary | ICD-10-CM

## 2022-12-30 LAB — COMPREHENSIVE METABOLIC PANEL
ALT: 15 U/L (ref 0–35)
AST: 18 U/L (ref 0–37)
Albumin: 4.4 g/dL (ref 3.5–5.2)
Alkaline Phosphatase: 54 U/L (ref 39–117)
BUN: 17 mg/dL (ref 6–23)
CO2: 33 mEq/L — ABNORMAL HIGH (ref 19–32)
Calcium: 9.5 mg/dL (ref 8.4–10.5)
Chloride: 101 mEq/L (ref 96–112)
Creatinine, Ser: 0.86 mg/dL (ref 0.40–1.20)
GFR: 70.31 mL/min (ref >60.00)
Glucose, Bld: 63 mg/dL — ABNORMAL LOW (ref 70–99)
Potassium: 4.6 mEq/L (ref 3.5–5.1)
Sodium: 139 mEq/L (ref 135–145)
Total Bilirubin: 0.4 mg/dL (ref 0.2–1.2)
Total Protein: 6.6 g/dL (ref 6.0–8.3)

## 2022-12-30 LAB — CBC WITH DIFFERENTIAL/PLATELET
Basophils Absolute: 0.1 10*3/uL (ref 0.0–0.1)
Basophils Relative: 1.2 % (ref 0.0–3.0)
Eosinophils Absolute: 0.2 10*3/uL (ref 0.0–0.7)
Eosinophils Relative: 4.1 % (ref 0.0–5.0)
HCT: 39.5 % (ref 36.0–46.0)
Hemoglobin: 13.3 g/dL (ref 12.0–15.0)
Lymphocytes Relative: 27.2 % (ref 12.0–46.0)
Lymphs Abs: 1.5 10*3/uL (ref 0.7–4.0)
MCHC: 33.6 g/dL (ref 30.0–36.0)
MCV: 90.2 fl (ref 78.0–100.0)
Monocytes Absolute: 0.4 10*3/uL (ref 0.1–1.0)
Monocytes Relative: 7.7 % (ref 3.0–12.0)
Neutro Abs: 3.3 10*3/uL (ref 1.4–7.7)
Neutrophils Relative %: 59.8 % (ref 43.0–77.0)
Platelets: 254 10*3/uL (ref 150.0–400.0)
RBC: 4.38 Mil/uL (ref 3.87–5.11)
RDW: 13.4 % (ref 11.5–15.5)
WBC: 5.5 10*3/uL (ref 4.0–10.5)

## 2022-12-30 LAB — LIPID PANEL
Cholesterol: 214 mg/dL — ABNORMAL HIGH (ref 0–200)
HDL: 86.6 mg/dL (ref >39.00)
LDL Cholesterol: 106 mg/dL — ABNORMAL HIGH (ref 0–99)
NonHDL: 127.54
Total CHOL/HDL Ratio: 2
Triglycerides: 108 mg/dL (ref 0.0–149.0)
VLDL: 21.6 mg/dL (ref 0.0–40.0)

## 2022-12-30 NOTE — Patient Instructions (Addendum)
Team please log covid shot from state database  Please stop by lab before you go If you have mychart- we will send your results within 3 business days of [place] receiving them.  If you do not have mychart- we will call you about results within 5 business days of [place] receiving them.  *please also note that you will see labs on mychart as soon as they post. I will later go in and write notes on them- will say "notes from Dr. Yong Channel"   Recommended follow up: Return in about 6 months (around 06/30/2023) for followup or sooner if needed.Schedule b4 you leave.

## 2022-12-30 NOTE — Progress Notes (Signed)
Phone [phone]   Subjective:  Patient presents today for their annual physical. Chief complaint-noted.   See problem oriented charting- ROS- full  review of systems was completed and negative except for: ear pain, mouth sores, post nasal drip, runny nose, sinus pressure, sneezing, cough- allergies bothering her particularly with weather change- working with DR. Teoh, joint pain, some numbness in fingers- carpal tunnel usually in mornings- braces still help , som sad mood and sleep issues- see depression section, stress related confusion- reports very hard year- worried about son Lissa Hoard  The following were reviewed and entered/updated in epic: Past Medical History:  Diagnosis Date   Allergy    SEASONAL   Arthritis    HANDS,FOOT -LEFT BIG TOE   Cataract    BILATERAL,SMALL   Claustrophobia    severe   Depression    H/O seasonal allergies    Hemorrhoids    Hypertension    Osteopenia    Postmenopausal HRT (hormone replacement therapy)    per pt, never took oral HR meds   Patient Active Problem List   Diagnosis Date Noted   Hyperlipidemia, unspecified 06/05/2020    Priority: Medium    Osteopenia 12/02/2017    Priority: Medium    GAD (generalized anxiety disorder) 12/02/2017    Priority: Medium    Major depression in full remission ([place]) 08/24/2007    Priority: Medium    Essential hypertension 08/24/2007    Priority: Medium    History of colonic polyps 06/13/2008    Priority: Low   Allergic rhinitis 08/24/2007    Priority: Low   Past Surgical History:  Procedure Laterality Date   CESAREAN SECTION     x2   COLONOSCOPY     POLYPECTOMY     WISDOM TOOTH EXTRACTION      Family History  Problem Relation Age of Onset   Hypertension Mother        sister as well   Prostate cancer Father        smoker   Hypertension Father    Ulcers Father    Colon polyps Sister    Gout Sister    Hypertension Sister    Gout Sister        brother   Gout Brother    Heart disease  Brother    Colon cancer Paternal Aunt    Colon cancer Paternal Aunt    Crohn's disease Neg Hx    Esophageal cancer Neg Hx    Rectal cancer Neg Hx    Stomach cancer Neg Hx    Ulcerative colitis Neg Hx     Medications- reviewed and updated Current Outpatient Medications  Medication Sig Dispense Refill   amLODipine-valsartan (EXFORGE) 5-320 MG tablet TAKE 1 TABLET BY MOUTH DAILY 90 tablet 3   atenolol (TENORMIN) 50 MG tablet TAKE 1 TABLET BY MOUTH EVERY DAY 90 tablet 1   buPROPion (WELLBUTRIN XL) 150 MG 24 hr tablet TAKE 2 TABLETS BY MOUTH EVERY DAY 180 tablet 3   Calcium Carbonate-Vitamin D (CALCIUM-D PO) Take by mouth.     Coenzyme Q10 (CO Q 10) 10 MG CAPS Take 1 tablet by mouth daily.     Estradiol 10 MCG TABS vaginal tablet INSERT 1 TABLET VAGINALLY 2 TIMES A WEEK 24 tablet 4   fluticasone (FLONASE) 50 MCG/ACT nasal spray SHAKE LIQUID AND USE 2 SPRAYS IN EACH NOSTRIL DAILY 48 g 1   glucosamine-chondroitin 500-400 MG tablet Take 1 tablet by mouth 2 (two) times daily as needed.  Menaquinone-7 (VITAMIN K2 PO) Take by mouth daily.     sertraline (ZOLOFT) 50 MG tablet Take 1 tablet (50 mg total) by mouth daily. 90 tablet 3   No current facility-administered medications for this visit.    Allergies-reviewed and updated Allergies  Allergen Reactions   Hydrochlorothiazide     REACTION: rash    Social History   Social History Narrative   Married. 2 living children, 1 died at birth. 4 grandchildren, 1 step grandchild.    Lives with husband.       Retired 2019   Former teacher-retired.    Prior with [employer]- caps and gowns etc. Stressful boss- no woff      Hobbies: rummy cube (tile game), antiquing, cooking, gardening   Objective  Objective:  BP 122/74   Pulse 70   Temp 98.1 F (36.7 C)   Ht '5\' 6"'$  (1.676 m)   Wt 142 lb 12.8 oz (64.8 kg)   LMP 10/02/2014   SpO2 98%   BMI 23.05 kg/m  Gen: NAD, resting comfortably HEENT: Mucous membranes are moist. Oropharynx  normal Neck: no thyromegaly CV: RRR no murmurs rubs or gallops Lungs: CTAB no crackles, wheeze, rhonchi Abdomen: soft/nontender/nondistended/normal bowel sounds. No rebound or guarding.  Ext: no edema Skin: warm, dry Neuro: grossly normal, moves all extremities, PERRLA   Assessment and Plan   67 y.o. adult presenting for annual physical.  Health Maintenance counseling: 1. Anticipatory guidance: Patient counseled regarding regular dental exams -q6 months, eye exams - yearly,  avoiding smoking and second hand smoke , limiting alcohol to 1 beverage per RCV-8/LFY , no illicit drugs .   2. Risk factor reduction:  Advised patient of need for regular exercise and diet rich and fruits and vegetables to reduce risk of heart attack and stroke.  Exercise-  not well lately- goal 150 minutes a week Diet/weight management-within a few lbs from last year- reasonably healthy diet .  Wt Readings from Last 3 Encounters:  12/30/22 142 lb 12.8 oz (64.8 kg)  08/20/22 143 lb 6.4 oz (65 kg)  06/25/22 144 lb (65.3 kg)  3. Immunizations/screenings/ancillary studies-fully up-to-date-COVID again after July to update new vaccine Immunization History  Administered Date(s) Administered   Fluad Quad(high Dose 65+) 10/06/2020   Influenza Split 09/10/2011, 09/18/2012   Influenza Whole 09/07/2010   Influenza, High Dose Seasonal PF 10/03/2022   Influenza,inj,Quad PF,6+ Mos 09/24/2013, 09/25/2014, 10/16/2015, 09/17/2016, 08/17/2017, 11/06/2018, 08/22/2019, 10/02/2021   Influenza,inj,quad, With Preservative 11/06/2018   Influenza-Unspecified 08/17/2017, 08/22/2019   Moderna Covid-19 Vaccine Bivalent Booster 37yr & up 07/16/2021, 10/02/2021   Moderna SARS-COV2 Booster Vaccination 06/25/2022   Moderna Sars-Covid-2 Vaccination 02/24/2020, 03/23/2020, 10/16/2020   PNEUMOCOCCAL CONJUGATE-20 06/11/2021   PPD Test 04/10/2012   Respiratory Syncytial Virus Vaccine,Recomb Aduvanted(Arexvy) 09/16/2022   Td 12/20/2000   Tdap  09/18/2012   Zoster Recombinat (Shingrix) 12/08/2020, 04/09/2021  4. Cervical cancer screening-still sees GYN and decision on Pap smear for 65 made with them-Tiffany WGilford Rile DNP.  Atrophic vaginitis on vaginal estrogen 5. Breast cancer screening-  breast exam with GYN and mammogram 04/02/2022  with 1 year repeat planned - 3d 6. Colon cancer screening - History of precancerous polyps-patient with precancerous polyp on later 09/29/2022 with Dr. SFuller Planwith plan for 3-year follow-up 7. Skin cancer screening-regularly sees dermatologist. advised regular sunscreen use. Denies worrisome, changing, or new skin lesions.  8. Birth control/STD check- monogamous/postmenopausal 9. Osteoporosis screening at 620 03/31/2021 with 3-year repeat planned for osteopenia-discussed calcium/vitamin D/weightbearing exercise goals  10. Smoking  associated screening -never smoker  Status of chronic or acute concerns   #social update- son Lissa Hoard has had health issues and has been tough year- multiple other health issues, upcoming election year stressful -plans to go to beach for a month and hoping that is refreshing - and [place] cruise later this year in June -[place] trip went well  #hypertension S: medication: Atenolol 50 mg daily, amlodipine-valsartan 5-320 mg daily BP Readings from Last 3 Encounters:  12/30/22 122/74  09/13/22 129/76  06/25/22 108/74   A/P: stable- continue current medicines   #hyperlipidemia S: Medication:none  -No first-degree relatives with CAD and ASCVD risk under 7.5% Lab Results  Component Value Date   CHOL 190 12/25/2021   HDL 78.10 12/25/2021   Bruce 93 12/25/2021   TRIG 91.0 12/25/2021   CHOLHDL 2 12/25/2021   A/P: Very mild lipid elevation in 10-year ASCVD risk under 7.5%-hold off on further evaluation or medicine and continue to work on diet and exercise   # Depression -recurrent S: Medication:Wellbutrin 150 mg extended release-takes 2 tablets daily, sertraline 25 mg- trying 50  mg july 2023    12/30/2022    9:41 AM 06/25/2022    8:55 AM 05/20/2022    2:03 PM  Depression screen PHQ 2/9  Decreased Interest 1 2 0  Down, Depressed, Hopeless 1 2 0  PHQ - 2 Score 2 4 0  Altered sleeping 1 0   Tired, decreased energy 0 1   Change in appetite 0 0   Feeling bad or failure about yourself  0 0   Trouble concentrating 0 0   Moving slowly or fidgety/restless 0 0   Suicidal thoughts 0 0   PHQ-9 Score 3 5   Difficult doing work/chores Not difficult at all Somewhat difficult   A/P: Full remission/improved-continue current medication- doing well despite tremendous stressors  Recommended follow up: Return in about 6 months (around 06/30/2023) for followup or sooner if needed.Schedule b4 you leave. Future Appointments  Date Time Provider [place]  02/14/2023  8:00 AM Tamela Gammon, NP [facility] None  05/26/2023  2:15 PM [facility] HEALTH COACH [facility]   Lab/Order associations: Not fasting   ICD-10-CM   1. Preventative health care  Z00.00     2. Hyperlipidemia, unspecified hyperlipidemia type  E78.5     3. Recurrent major depressive disorder, in full remission ([place]) Chronic F33.42     4. Essential hypertension  I10       No orders of the defined types were placed in this encounter.   Return precautions advised.  Garret Reddish, MD

## 2023-01-15 ENCOUNTER — Other Ambulatory Visit: Payer: Self-pay | Admitting: Family Medicine

## 2023-02-14 ENCOUNTER — Encounter: Payer: Self-pay | Admitting: Nurse Practitioner

## 2023-02-14 ENCOUNTER — Ambulatory Visit: Payer: Medicare PPO | Admitting: Nurse Practitioner

## 2023-02-14 VITALS — BP 112/74 | HR 68 | Wt 143.0 lb

## 2023-02-14 DIAGNOSIS — N952 Postmenopausal atrophic vaginitis: Secondary | ICD-10-CM

## 2023-02-14 DIAGNOSIS — M8589 Other specified disorders of bone density and structure, multiple sites: Secondary | ICD-10-CM

## 2023-02-14 NOTE — Progress Notes (Signed)
   Sharon Medina 1956/08/20 [id number]   History:  67 y.o. G3P2 presents for medication management. Postmenopausal, uses vaginal estradiol for atrophy and dryness with good management. Osteopenia.   Gynecologic History Patient's last menstrual period was 10/02/2014.   Contraception/Family planning: post menopausal status Sexually active: Yes  Health Maintenance Last Pap: 02/04/2021. Results were: Normal, 3-year repeat Last mammogram: 04/02/2022. Results were:  Normal Last colonoscopy: 09/13/2022. Results were: SSP, tubular adenoma, 3-year recall Last Dexa: 03/31/2021. Results were: T-score -1.6, FRAX 8.6% / 1.0%  Past medical history, past surgical history, family history and social history were all reviewed and documented in the EPIC chart. Married. Retired. 2 children, live in [place] area. 2 paternal aunts with history of colon cancer.   ROS:  A ROS was performed and pertinent positives and negatives are included.  Exam:  Vitals:   02/14/23 0753  BP: 112/74  Pulse: 68  SpO2: 100%  Weight: 143 lb (64.9 kg)     Body mass index is 23.08 kg/m.  General appearance:  Normal Thyroid:  Symmetrical, normal in size, without palpable masses or nodularity. Respiratory  Auscultation:  Clear without wheezing or rhonchi Cardiovascular  Auscultation:  Regular rate, without rubs, murmurs or gallops  Edema/varicosities:  Not grossly evident Abdominal  Soft,nontender, without masses, guarding or rebound.  Liver/spleen:  No organomegaly noted  Hernia:  None appreciated  Skin  Inspection:  Grossly normal Breasts: Not indicated Genitourinary : Not indicated  Assessment/Plan:  67 y.o. G3P2 for medication management.   Postmenopausal atrophic vaginitis - Estradiol 10 mcg vaginal tablets twice weekly. Does not need refill at this time. Good management.   Osteopenia of multiple sites - Plan: DG Bone Density. T-score -1.25 March 2021 without elevated FRAX. Continue Vitamin D supplement  and incorporate weightbearing exercises into routine.  Return in 1 year for breast and pelvic exam.       Tamela Gammon [facility], 8:25 AM 02/14/2023

## 2023-02-21 ENCOUNTER — Other Ambulatory Visit: Payer: Self-pay | Admitting: Family Medicine

## 2023-02-21 DIAGNOSIS — Z1231 Encounter for screening mammogram for malignant neoplasm of breast: Secondary | ICD-10-CM

## 2023-03-17 ENCOUNTER — Other Ambulatory Visit: Payer: Self-pay

## 2023-03-17 DIAGNOSIS — N952 Postmenopausal atrophic vaginitis: Secondary | ICD-10-CM

## 2023-03-17 MED ORDER — ESTRADIOL 10 MCG VA TABS: ORAL_TABLET | VAGINAL | 3 refills | Status: DC

## 2023-03-17 NOTE — Telephone Encounter (Signed)
AEX 02/14/23

## 2023-04-05 ENCOUNTER — Other Ambulatory Visit: Payer: Self-pay | Admitting: Nurse Practitioner

## 2023-04-05 ENCOUNTER — Ambulatory Visit (INDEPENDENT_AMBULATORY_CARE_PROVIDER_SITE_OTHER): Payer: Medicare PPO

## 2023-04-05 DIAGNOSIS — N952 Postmenopausal atrophic vaginitis: Secondary | ICD-10-CM

## 2023-04-05 DIAGNOSIS — Z13820 Encounter for screening for osteoporosis: Secondary | ICD-10-CM

## 2023-04-05 DIAGNOSIS — Z780 Asymptomatic menopausal state: Secondary | ICD-10-CM

## 2023-04-05 DIAGNOSIS — M8589 Other specified disorders of bone density and structure, multiple sites: Secondary | ICD-10-CM

## 2023-04-06 ENCOUNTER — Ambulatory Visit
Admission: RE | Admit: 2023-04-06 | Discharge: 2023-04-06 | Disposition: A | Discharge status: Home / Self Care | Payer: Medicare PPO | Source: Ambulatory Visit

## 2023-04-06 DIAGNOSIS — Z1231 Encounter for screening mammogram for malignant neoplasm of breast: Secondary | ICD-10-CM

## 2023-04-07 DIAGNOSIS — J310 Chronic rhinitis: Secondary | ICD-10-CM | POA: Diagnosis not present

## 2023-04-07 DIAGNOSIS — J343 Hypertrophy of nasal turbinates: Secondary | ICD-10-CM | POA: Diagnosis not present

## 2023-04-07 DIAGNOSIS — H6983 Other specified disorders of Eustachian tube, bilateral: Secondary | ICD-10-CM | POA: Diagnosis not present

## 2023-04-07 DIAGNOSIS — H903 Sensorineural hearing loss, bilateral: Secondary | ICD-10-CM | POA: Diagnosis not present

## 2023-04-07 DIAGNOSIS — J342 Deviated nasal septum: Secondary | ICD-10-CM | POA: Diagnosis not present

## 2023-04-28 ENCOUNTER — Other Ambulatory Visit: Payer: Self-pay

## 2023-04-28 MED ORDER — ATENOLOL 50 MG PO TABS: 50.0000 mg | ORAL_TABLET | Freq: Every day | ORAL | 2 refills | Status: DC

## 2023-05-05 DIAGNOSIS — L538 Other specified erythematous conditions: Secondary | ICD-10-CM | POA: Diagnosis not present

## 2023-05-05 DIAGNOSIS — Z789 Other specified health status: Secondary | ICD-10-CM | POA: Diagnosis not present

## 2023-05-05 DIAGNOSIS — L820 Inflamed seborrheic keratosis: Secondary | ICD-10-CM | POA: Diagnosis not present

## 2023-05-05 DIAGNOSIS — L570 Actinic keratosis: Secondary | ICD-10-CM | POA: Diagnosis not present

## 2023-05-05 DIAGNOSIS — L814 Other melanin hyperpigmentation: Secondary | ICD-10-CM | POA: Diagnosis not present

## 2023-05-05 DIAGNOSIS — L821 Other seborrheic keratosis: Secondary | ICD-10-CM | POA: Diagnosis not present

## 2023-05-05 DIAGNOSIS — D1801 Hemangioma of skin and subcutaneous tissue: Secondary | ICD-10-CM | POA: Diagnosis not present

## 2023-05-05 DIAGNOSIS — L298 Other pruritus: Secondary | ICD-10-CM | POA: Diagnosis not present

## 2023-06-07 ENCOUNTER — Telehealth: Payer: Medicare PPO | Admitting: Family Medicine

## 2023-06-07 VITALS — Ht 66.0 in | Wt 140.0 lb

## 2023-06-07 DIAGNOSIS — U071 COVID-19: Secondary | ICD-10-CM

## 2023-06-07 NOTE — Progress Notes (Signed)
Virtual Visit via Video Note  Subjective  CC:  Chief Complaint  Patient presents with   Covid Positive     I connected with Sharon Medina on 06/07/23 at 11:15 AM EDT by a video enabled telemedicine application and verified that I am speaking with the correct person using two identifiers. Location patient: Home Location provider:  [facility] [address] Office Persons participating in the virtual visit: Janneth, [place], MD Trudie Reed  CMA  I discussed the limitations of evaluation and management by telemedicine and the availability of in person appointments. The patient expressed understanding and agreed to proceed. HPI: Sharon Medina is a 67 y.o. adult who was contacted today to address the problems listed above in the chief complaint. 67 year old female presents with mild symptoms of COVID.  She is on day 3 of illness.  Recently returned from a cruise.  Has congestion and mild deep cough.  Some fatigue.  Lack of appetite and myalgias.  Had a low-grade fever 99.8 yesterday, none today.  No nausea vomiting or shortness of breath.  Assessment  1. COVID-19      Plan  Mild COVID infection: Day 3 of illness, due to mild symptoms, recent vaccination as of May and mostly healthy (qualifies as high risk for age and history of hypertension) we elect to manage her symptoms supportively.  Over-the-counter Robitussin for cough, Tylenol or Advil for body aches.  Nutrition and rest for fatigue.  Monitor for shortness of breath.  Discussed risk and benefits of Paxlovid, we elect not to use it. I discussed the assessment and treatment plan with the patient. The patient was provided an opportunity to ask questions and all were answered. The patient agreed with the plan and demonstrated an understanding of the instructions.   The patient was advised to call back or seek an in-person evaluation if the symptoms worsen or if the condition fails to  improve as anticipated. Follow up: As scheduled with PCP 07/07/2023  No orders of the defined types were placed in this encounter.     I reviewed the patients updated PMH, FH, and SocHx.    Patient Active Problem List   Diagnosis Date Noted   Hyperlipidemia, unspecified 06/05/2020   Osteopenia 12/02/2017   GAD (generalized anxiety disorder) 12/02/2017   History of colonic polyps 06/13/2008   Major depression in full remission ([facility]) 08/24/2007   Essential hypertension 08/24/2007   Allergic rhinitis 08/24/2007   Current Meds  Medication Sig   amLODipine-valsartan (EXFORGE) 5-320 MG tablet TAKE 1 TABLET BY MOUTH DAILY   atenolol (TENORMIN) 50 MG tablet Take 1 tablet (50 mg total) by mouth daily.   buPROPion (WELLBUTRIN XL) 150 MG 24 hr tablet TAKE 2 TABLETS BY MOUTH EVERY DAY   Calcium Carbonate-Vitamin D (CALCIUM-D PO) Take by mouth.   Coenzyme Q10 (CO Q 10) 10 MG CAPS Take 1 tablet by mouth daily.   Estradiol 10 MCG TABS vaginal tablet INSERT 1 TABLET VAGINALLY 2 TIMES A WEEK   fluticasone (FLONASE) 50 MCG/ACT nasal spray SHAKE LIQUID AND USE 2 SPRAYS IN EACH NOSTRIL DAILY   glucosamine-chondroitin 500-400 MG tablet Take 1 tablet by mouth 2 (two) times daily as needed.   Menaquinone-7 (VITAMIN K2 PO) Take by mouth daily.   sertraline (ZOLOFT) 50 MG tablet Take 1 tablet (50 mg total) by mouth daily.    Allergies: Patient is allergic to hydrochlorothiazide. Family History: Patient family history includes Colon cancer in  her paternal aunt and paternal aunt; Colon polyps in her sister; Gout in her brother, sister, and sister; Heart disease in her brother; Hypertension in her father, mother, and sister; Prostate cancer in her father; Ulcers in her father. Social History:  Patient  reports that she has never smoked. She has been exposed to tobacco smoke. She has never used smokeless tobacco. She reports current alcohol use of about 6.0 standard drinks of alcohol per week. She reports  that she does not use drugs.  Review of Systems: Constitutional: Negative for fever malaise or anorexia Cardiovascular: negative for chest pain Respiratory: negative for SOB or persistent cough Gastrointestinal: negative for abdominal pain  OBJECTIVE Vitals: Ht 5\' 6"  (1.676 m)   Wt 140 lb (63.5 kg)   LMP 10/02/2014   BMI 22.60 kg/m  General: no acute distress , A&Ox3 Mild nasal congestion, no respiratory distress  Willow Ora, MD

## 2023-06-28 DIAGNOSIS — H35363 Drusen (degenerative) of macula, bilateral: Secondary | ICD-10-CM | POA: Diagnosis not present

## 2023-06-28 DIAGNOSIS — H52223 Regular astigmatism, bilateral: Secondary | ICD-10-CM | POA: Diagnosis not present

## 2023-06-28 DIAGNOSIS — H2513 Age-related nuclear cataract, bilateral: Secondary | ICD-10-CM | POA: Diagnosis not present

## 2023-06-28 DIAGNOSIS — H35033 Hypertensive retinopathy, bilateral: Secondary | ICD-10-CM | POA: Diagnosis not present

## 2023-06-28 DIAGNOSIS — H524 Presbyopia: Secondary | ICD-10-CM | POA: Diagnosis not present

## 2023-06-30 ENCOUNTER — Ambulatory Visit (INDEPENDENT_AMBULATORY_CARE_PROVIDER_SITE_OTHER): Payer: Medicare PPO

## 2023-06-30 VITALS — Wt 140.0 lb

## 2023-06-30 DIAGNOSIS — Z0000 Encounter for general adult medical examination without abnormal findings: Secondary | ICD-10-CM | POA: Diagnosis not present

## 2023-06-30 NOTE — Progress Notes (Signed)
Subjective:   Sharon Medina is a 67 y.o. female who presents for Medicare Annual (Subsequent) preventive examination.  Visit Complete: Virtual  I connected with  Sharon Medina on 06/30/23 by a audio enabled telemedicine application and verified that I am speaking with the correct person using two identifiers.  Patient Location: Home  Provider Location: Office/Clinic  I discussed the limitations of evaluation and management by telemedicine. The patient expressed understanding and agreed to proceed.  Patient Medicare AWV questionnaire was completed by the patient on 06/26/23; I have confirmed that all information answered by patient is correct and no changes since this date.  Review of Systems     Cardiac Risk Factors include: advanced age (>59men, >30 women);dyslipidemia;hypertension     Objective:    Today's Vitals   06/30/23 0901  Weight: 140 lb (63.5 kg)   Body mass index is 22.6 kg/m.     06/30/2023    9:08 AM 05/20/2022    2:04 PM 07/20/2016    8:06 AM  Advanced Directives  Does Patient Have a Medical Advance Directive? Yes Yes No  Type of Estate agent of [place];Living will Healthcare Power of Attorney   Copy of Healthcare Power of Attorney in Chart? No - copy requested No - copy requested     Current Medications (verified) Outpatient Encounter Medications as of 06/30/2023  Medication Sig   amLODipine-valsartan (EXFORGE) 5-320 MG tablet TAKE 1 TABLET BY MOUTH DAILY   atenolol (TENORMIN) 50 MG tablet Take 1 tablet (50 mg total) by mouth daily.   buPROPion (WELLBUTRIN XL) 150 MG 24 hr tablet TAKE 2 TABLETS BY MOUTH EVERY DAY   Calcium Carbonate-Vitamin D (CALCIUM-D PO) Take by mouth.   Coenzyme Q10 (CO Q 10) 10 MG CAPS Take 1 tablet by mouth daily.   Estradiol 10 MCG TABS vaginal tablet INSERT 1 TABLET VAGINALLY 2 TIMES A WEEK   fluticasone (FLONASE) 50 MCG/ACT nasal spray SHAKE LIQUID AND USE 2 SPRAYS IN EACH NOSTRIL DAILY    glucosamine-chondroitin 500-400 MG tablet Take 1 tablet by mouth 2 (two) times daily as needed.   Menaquinone-7 (VITAMIN K2 PO) Take by mouth daily.   sertraline (ZOLOFT) 50 MG tablet Take 1 tablet (50 mg total) by mouth daily.   No facility-administered encounter medications on file as of 06/30/2023.    Allergies (verified) Hydrochlorothiazide   History: Past Medical History:  Diagnosis Date   Allergy    SEASONAL   Arthritis    HANDS,FOOT -LEFT BIG TOE   Cataract    BILATERAL,SMALL   Claustrophobia    severe   Depression    H/O seasonal allergies    Hemorrhoids    Hypertension    Osteopenia    Postmenopausal HRT (hormone replacement therapy)    per pt, never took oral HR meds   Past Surgical History:  Procedure Laterality Date   CESAREAN SECTION     x2   COLONOSCOPY     POLYPECTOMY     WISDOM TOOTH EXTRACTION     Family History  Problem Relation Age of Onset   Hypertension Mother        sister as well   Prostate cancer Father        smoker   Hypertension Father    Ulcers Father    Colon polyps Sister    Gout Sister    Hypertension Sister    Gout Sister        brother   Gout Brother  Heart disease Brother    Colon cancer Paternal Aunt    Colon cancer Paternal Aunt    Crohn's disease Neg Hx    Esophageal cancer Neg Hx    Rectal cancer Neg Hx    Stomach cancer Neg Hx    Ulcerative colitis Neg Hx    Social History   Socioeconomic History   Marital status: Married    Spouse name: Not on file   Number of children: Not on file   Years of education: Not on file   Highest education level: Not on file  Occupational History   Not on file  Tobacco Use   Smoking status: Never    Passive exposure: Past (BOTH PARENTS SMOKED)   Smokeless tobacco: Never  Vaping Use   Vaping status: Never Used  Substance and Sexual Activity   Alcohol use: Yes    Alcohol/week: 6.0 standard drinks of alcohol    Types: 6 Standard drinks or equivalent per week    Comment:  WINE 2-3 X A WEEK   Drug use: No   Sexual activity: Not Currently    Comment: 1st intercourse 67 yo-Fewer than 5 partners  Other Topics Concern   Not on file  Social History Narrative   Married. 2 living children, 1 died at birth. 4 grandchildren, 1 step grandchild.    Lives with husband.       Retired 2019   Former teacher-retired.    Prior with [employer]- caps and gowns etc. Stressful boss- no woff      Hobbies: rummy cube (tile game), antiquing, cooking, gardening   Social Determinants of Health   Financial Resource Strain: Low Risk  (06/26/2023)   Overall Financial Resource Strain (CARDIA)    Difficulty of Paying Living Expenses: Not hard at all  Food Insecurity: No Food Insecurity (06/26/2023)   Hunger Vital Sign    Worried About Running Out of Food in the Last Year: Never true    Ran Out of Food in the Last Year: Never true  Transportation Needs: No Transportation Needs (06/26/2023)   PRAPARE - Administrator, Civil Service (Medical): No    Lack of Transportation (Non-Medical): No  Physical Activity: Insufficiently Active (06/26/2023)   Exercise Vital Sign    Days of Exercise per Week: 1 day    Minutes of Exercise per Session: 20 min  Stress: Stress Concern Present (06/26/2023)   [employer] of Occupational Health - Occupational Stress Questionnaire    Feeling of Stress : To some extent  Social Connections: Moderately Isolated (06/26/2023)   Social Connection and Isolation Panel [NHANES]    Frequency of Communication with Friends and Family: More than three times a week    Frequency of Social Gatherings with Friends and Family: Three times a week    Attends Religious Services: Never    Active Member of Clubs or Organizations: No    Attends Banker Meetings: Never    Marital Status: Married    Tobacco Counseling Counseling given: Not Answered   Clinical Intake:  Pre-visit preparation completed: Yes  Pain : No/denies pain     BMI -  recorded: 22.6 Nutritional Status: BMI of 19-24  Normal Nutritional Risks: None Diabetes: No  How often do you need to have someone help you when you read instructions, pamphlets, or other written materials from your doctor or pharmacy?: 1 - Never  Interpreter Needed?: No  Information entered by :: Lanier Ensign, LPN   Activities of Daily Living  06/26/2023    9:57 AM  In your present state of health, do you have any difficulty performing the following activities:  Hearing? 0  Vision? 0  Difficulty concentrating or making decisions? 1  Walking or climbing stairs? 0  Dressing or bathing? 0  Doing errands, shopping? 0  Preparing Food and eating ? N  Using the Toilet? N  In the past six months, have you accidently leaked urine? N  Do you have problems with loss of bowel control? N  Managing your Medications? N  Managing your Finances? N  Housekeeping or managing your Housekeeping? N    Patient Care Team: Shelva Majestic, MD as PCP - General (Family Medicine)  Indicate any recent Medical Services you may have received from other than [facility] providers in the past year (date may be approximate).     Assessment:   This is a routine wellness examination for Alicen.  Hearing/Vision screen Hearing Screening - Comments:: Pt denies any hearing issues  Vision Screening - Comments:: Pt follows up with Dr Lucretia Roers for annual eye exams   Dietary issues and exercise activities discussed:     Goals Addressed               This Visit's Progress     Patient Stated (pt-stated)        Keep weight stable and eat healthy        Depression Screen    06/30/2023    9:06 AM 06/07/2023   11:06 AM 12/30/2022    9:41 AM 06/25/2022    8:55 AM 05/20/2022    2:03 PM 12/25/2021   11:53 AM 06/11/2021    9:51 AM  PHQ 2/9 Scores  PHQ - 2 Score 0 0 2 4 0 1 0  PHQ- 9 Score 0 0 3 5  3 4     Fall Risk    06/26/2023    9:57 AM 06/07/2023   11:06 AM 04/05/2023    8:50 AM 05/20/2022    2:05 PM  06/11/2021    9:51 AM  Fall Risk   Falls in the past year? 0 0 0 0 0  Number falls in past yr: 0 0 0 0 0  Injury with Fall?  0 0 0 0  Risk for fall due to : Impaired vision No Fall Risks No Fall Risks Impaired vision No Fall Risks  Follow up Falls prevention discussed Falls evaluation completed Falls evaluation completed Falls prevention discussed Falls evaluation completed    MEDICARE RISK AT HOME:   TIMED UP AND GO:  Was the test performed?  No    Cognitive Function:        06/30/2023    9:09 AM 05/20/2022    2:06 PM  6CIT Screen  What Year? 0 points 0 points  What month? 0 points 0 points  What time? 0 points 0 points  Count back from 20 0 points 0 points  Months in reverse 0 points 0 points  Repeat phrase 0 points 0 points  Total Score 0 points 0 points    Immunizations Immunization History  Administered Date(s) Administered   Fluad Quad(high Dose 65+) 10/06/2020   Influenza Split 09/10/2011, 09/18/2012   Influenza Whole 09/07/2010   Influenza, High Dose Seasonal PF 10/03/2022   Influenza,inj,Quad PF,6+ Mos 09/24/2013, 09/25/2014, 10/16/2015, 09/17/2016, 08/17/2017, 11/06/2018, 08/22/2019, 10/02/2021   Influenza,inj,quad, With Preservative 11/06/2018   Influenza-Unspecified 08/17/2017, 08/22/2019   Moderna Covid-19 Vaccine Bivalent Booster 35yrs & up 07/16/2021, 10/02/2021   Moderna SARS-COV2  Booster Vaccination 06/25/2022   Moderna Sars-Covid-2 Vaccination 02/24/2020, 03/23/2020, 10/16/2020   PNEUMOCOCCAL CONJUGATE-20 06/11/2021   PPD Test 04/10/2012   Respiratory Syncytial Virus Vaccine,Recomb Aduvanted(Arexvy) 09/16/2022   Td 12/20/2000   Tdap 09/18/2012   Zoster Recombinant(Shingrix) 12/08/2020, 04/09/2021    TDAP status: Up to date  Flu Vaccine status: Due, Education has been provided regarding the importance of this vaccine. Advised may receive this vaccine at local pharmacy or Health Dept. Aware to provide a copy of the vaccination record if obtained  from local pharmacy or Health Dept. Verbalized acceptance and understanding.  Pneumococcal vaccine status: Up to date  Covid-19 vaccine status: Completed vaccines  Qualifies for Shingles Vaccine? Yes   Zostavax completed Yes   Shingrix Completed?: Yes  Screening Tests Health Maintenance  Topic Date Due   COVID-19 Vaccine (7 - 2023-24 season) 08/20/2022   INFLUENZA VACCINE  07/21/2023   Medicare Annual Wellness (AWV)  06/29/2024   MAMMOGRAM  04/05/2025   Colonoscopy  09/13/2025   DEXA SCAN  04/04/2026   Pneumonia Vaccine 26+ Years old  Completed   Hepatitis C Screening  Completed   Zoster Vaccines- Shingrix  Completed   HPV VACCINES  Aged Out   DTaP/Tdap/Td  Discontinued    Health Maintenance  Health Maintenance Due  Topic Date Due   COVID-19 Vaccine (7 - 2023-24 season) 08/20/2022    Colorectal cancer screening: Type of screening: Colonoscopy. Completed 09/13/22. Repeat every 10 years  Mammogram status: Completed 04/06/23. Repeat every year  Bone Density status: Completed 04/05/23. Results reflect: Bone density results: OSTEOPENIA. Repeat every 2 years.   Additional Screening:  Hepatitis C Screening: Completed 12/02/17  Vision Screening: Recommended annual ophthalmology exams for early detection of glaucoma and other disorders of the eye. Is the patient up to date with their annual eye exam?  Yes  Who is the provider or what is the name of the office in which the patient attends annual eye exams? Dr Lucretia Roers  If pt is not established with a provider, would they like to be referred to a provider to establish care? No .   Dental Screening: Recommended annual dental exams for proper oral hygiene    Community Resource Referral / Chronic Care Management: CRR required this visit?  No   CCM required this visit?  No     Plan:     I have personally reviewed and noted the following in the patient's chart:   Medical and social history Use of alcohol, tobacco or illicit  drugs  Current medications and supplements including opioid prescriptions. Patient is not currently taking opioid prescriptions. Functional ability and status Nutritional status Physical activity Advanced directives List of other physicians Hospitalizations, surgeries, and ER visits in previous 12 months Vitals Screenings to include cognitive, depression, and falls Referrals and appointments  In addition, I have reviewed and discussed with patient certain preventive protocols, quality metrics, and best practice recommendations. A written personalized care plan for preventive services as well as general preventive health recommendations were provided to patient.     Marzella Schlein, LPN   1/61/0960   After Visit Summary: (MyChart) Due to this being a telephonic visit, the after visit summary with patients personalized plan was offered to patient via MyChart   Nurse Notes: none

## 2023-06-30 NOTE — Patient Instructions (Signed)
Sharon Medina , Thank you for taking time to come for your Medicare Wellness Visit. I appreciate your ongoing commitment to your health goals. Please review the following plan we discussed and let me know if I can assist you in the future.   These are the goals we discussed:  Goals       Patient Stated      None at this time       Patient Stated (pt-stated)      Keep weight stable and eat healthy         This is a list of the screening recommended for you and due dates:  Health Maintenance  Topic Date Due   COVID-19 Vaccine (7 - 2023-24 season) 08/20/2022   Flu Shot  07/21/2023   Medicare Annual Wellness Visit  06/29/2024   Mammogram  04/05/2025   Colon Cancer Screening  09/13/2025   DEXA scan (bone density measurement)  04/04/2026   Pneumonia Vaccine  Completed   Hepatitis C Screening  Completed   Zoster (Shingles) Vaccine  Completed   HPV Vaccine  Aged Out   DTaP/Tdap/Td vaccine  Discontinued    Advanced directives: Please bring a copy of your health care power of attorney and living will to the office at your convenience.  Conditions/risks identified: keep weight down and eat healthy   Next appointment: Follow up in one year for your annual wellness visit    Preventive Care 65 Years and Older, Female Preventive care refers to lifestyle choices and visits with your health care provider that can promote health and wellness. What does preventive care include? A yearly physical exam. This is also called an annual well check. Dental exams once or twice a year. Routine eye exams. Ask your health care provider how often you should have your eyes checked. Personal lifestyle choices, including: Daily care of your teeth and gums. Regular physical activity. Eating a healthy diet. Avoiding tobacco and drug use. Limiting alcohol use. Practicing safe sex. Taking low-dose aspirin every day. Taking vitamin and mineral supplements as recommended by your health care  provider. What happens during an annual well check? The services and screenings done by your health care provider during your annual well check will depend on your age, overall health, lifestyle risk factors, and family history of disease. Counseling  Your health care provider may ask you questions about your: Alcohol use. Tobacco use. Drug use. Emotional well-being. Home and relationship well-being. Sexual activity. Eating habits. History of falls. Memory and ability to understand (cognition). Work and work Astronomer. Reproductive health. Screening  You may have the following tests or measurements: Height, weight, and BMI. Blood pressure. Lipid and cholesterol levels. These may be checked every 5 years, or more frequently if you are over 72 years old. Skin check. Lung cancer screening. You may have this screening every year starting at age 76 if you have a 30-pack-year history of smoking and currently smoke or have quit within the past 15 years. Fecal occult blood test (FOBT) of the stool. You may have this test every year starting at age 76. Flexible sigmoidoscopy or colonoscopy. You may have a sigmoidoscopy every 5 years or a colonoscopy every 10 years starting at age 93. Hepatitis C blood test. Hepatitis B blood test. Sexually transmitted disease (STD) testing. Diabetes screening. This is done by checking your blood sugar (glucose) after you have not eaten for a while (fasting). You may have this done every 1-3 years. Bone density scan. This is done to  screen for osteoporosis. You may have this done starting at age 19. Mammogram. This may be done every 1-2 years. Talk to your health care provider about how often you should have regular mammograms. Talk with your health care provider about your test results, treatment options, and if necessary, the need for more tests. Vaccines  Your health care provider may recommend certain vaccines, such as: Influenza vaccine. This is  recommended every year. Tetanus, diphtheria, and acellular pertussis (Tdap, Td) vaccine. You may need a Td booster every 10 years. Zoster vaccine. You may need this after age 46. Pneumococcal 13-valent conjugate (PCV13) vaccine. One dose is recommended after age 62. Pneumococcal polysaccharide (PPSV23) vaccine. One dose is recommended after age 41. Talk to your health care provider about which screenings and vaccines you need and how often you need them. This information is not intended to replace advice given to you by your health care provider. Make sure you discuss any questions you have with your health care provider. Document Released: 01/02/2016 Document Revised: 08/25/2016 Document Reviewed: 10/07/2015 Elsevier Interactive Patient Education  2017 [employer].  Fall Prevention in the Home Falls can cause injuries. They can happen to people of all ages. There are many things you can do to make your home safe and to help prevent falls. What can I do on the outside of my home? Regularly fix the edges of walkways and driveways and fix any cracks. Remove anything that might make you trip as you walk through a door, such as a raised step or threshold. Trim any bushes or trees on the path to your home. Use bright outdoor lighting. Clear any walking paths of anything that might make someone trip, such as rocks or tools. Regularly check to see if handrails are loose or broken. Make sure that both sides of any steps have handrails. Any raised decks and porches should have guardrails on the edges. Have any leaves, snow, or ice cleared regularly. Use sand or salt on walking paths during winter. Clean up any spills in your garage right away. This includes oil or grease spills. What can I do in the bathroom? Use night lights. Install grab bars by the toilet and in the tub and shower. Do not use towel bars as grab bars. Use non-skid mats or decals in the tub or shower. If you need to sit down in  the shower, use a plastic, non-slip stool. Keep the floor dry. Clean up any water that spills on the floor as soon as it happens. Remove soap buildup in the tub or shower regularly. Attach bath mats securely with double-sided non-slip rug tape. Do not have throw rugs and other things on the floor that can make you trip. What can I do in the bedroom? Use night lights. Make sure that you have a light by your bed that is easy to reach. Do not use any sheets or blankets that are too big for your bed. They should not hang down onto the floor. Have a firm chair that has side arms. You can use this for support while you get dressed. Do not have throw rugs and other things on the floor that can make you trip. What can I do in the kitchen? Clean up any spills right away. Avoid walking on wet floors. Keep items that you use a lot in easy-to-reach places. If you need to reach something above you, use a strong step stool that has a grab bar. Keep electrical cords out of the way.  Do not use floor polish or wax that makes floors slippery. If you must use wax, use non-skid floor wax. Do not have throw rugs and other things on the floor that can make you trip. What can I do with my stairs? Do not leave any items on the stairs. Make sure that there are handrails on both sides of the stairs and use them. Fix handrails that are broken or loose. Make sure that handrails are as long as the stairways. Check any carpeting to make sure that it is firmly attached to the stairs. Fix any carpet that is loose or worn. Avoid having throw rugs at the top or bottom of the stairs. If you do have throw rugs, attach them to the floor with carpet tape. Make sure that you have a light switch at the top of the stairs and the bottom of the stairs. If you do not have them, ask someone to add them for you. What else can I do to help prevent falls? Wear shoes that: Do not have high heels. Have rubber bottoms. Are comfortable  and fit you well. Are closed at the toe. Do not wear sandals. If you use a stepladder: Make sure that it is fully opened. Do not climb a closed stepladder. Make sure that both sides of the stepladder are locked into place. Ask someone to hold it for you, if possible. Clearly mark and make sure that you can see: Any grab bars or handrails. First and last steps. Where the edge of each step is. Use tools that help you move around (mobility aids) if they are needed. These include: Canes. Walkers. Scooters. Crutches. Turn on the lights when you go into a dark area. Replace any light bulbs as soon as they burn out. Set up your furniture so you have a clear path. Avoid moving your furniture around. If any of your floors are uneven, fix them. If there are any pets around you, be aware of where they are. Review your medicines with your doctor. Some medicines can make you feel dizzy. This can increase your chance of falling. Ask your doctor what other things that you can do to help prevent falls. This information is not intended to replace advice given to you by your health care provider. Make sure you discuss any questions you have with your health care provider. Document Released: 10/02/2009 Document Revised: 05/13/2016 Document Reviewed: 01/10/2015 Elsevier Interactive Patient Education  2017 [employer].

## 2023-07-01 ENCOUNTER — Other Ambulatory Visit: Payer: Self-pay | Admitting: *Deleted

## 2023-07-01 ENCOUNTER — Other Ambulatory Visit: Payer: Self-pay | Admitting: Radiology

## 2023-07-01 ENCOUNTER — Ambulatory Visit: Payer: Medicare PPO | Admitting: Radiology

## 2023-07-01 VITALS — BP 116/82

## 2023-07-01 DIAGNOSIS — R310 Gross hematuria: Secondary | ICD-10-CM | POA: Diagnosis not present

## 2023-07-01 DIAGNOSIS — N950 Postmenopausal bleeding: Secondary | ICD-10-CM | POA: Diagnosis not present

## 2023-07-01 LAB — URINALYSIS, MICROSCOPIC ONLY: Hyaline Cast: NONE SEEN /LPF

## 2023-07-01 LAB — WET PREP FOR TRICH, YEAST, CLUE

## 2023-07-01 NOTE — Progress Notes (Signed)
      Subjective: Sharon Medina is a 67 y.o. adult who complains of spot bleeding when wiping twice in 24 hours. Nothing in her underwear. Unsure if it was coming from her vaginal or urthera. Denies any urinary symptoms.  Review of Systems  All other systems reviewed and are negative.   Past Medical History:  Diagnosis Date   Allergy    SEASONAL   Arthritis    HANDS,FOOT -LEFT BIG TOE   Cataract    BILATERAL,SMALL   Claustrophobia    severe   Depression    H/O seasonal allergies    Hemorrhoids    Hypertension    Osteopenia    Postmenopausal HRT (hormone replacement therapy)    per pt, never took oral HR meds      Objective:  Today's Vitals   07/01/23 0808  BP: 116/82   There is no height or weight on file to calculate BMI.   -General: no acute distress -Vulva: without lesions or discharge -Vagina: white discharge present,  wet prep obtained. No bleeding -Cervix: no lesion or discharge, no CMT -Perineum: no lesions -Uterus: Mobile, non tender -Adnexa: no masses or tenderness   Microscopic wet-mount exam shows negative for pathogens, normal epithelial cells.   Raynelle Fanning, CMA present for exam  Assessment:/Plan:   1. PMB (postmenopausal bleeding) Expectant management, if bleeding returns will schedule u/s - WET PREP FOR TRICH, YEAST, CLUE - Urine Microscopic Only

## 2023-07-04 ENCOUNTER — Ambulatory Visit: Payer: Medicare PPO | Admitting: Family Medicine

## 2023-07-04 LAB — URINE CULTURE
MICRO NUMBER:: 15193356
SPECIMEN QUALITY:: ADEQUATE

## 2023-07-05 ENCOUNTER — Other Ambulatory Visit: Payer: Self-pay

## 2023-07-05 DIAGNOSIS — N390 Urinary tract infection, site not specified: Secondary | ICD-10-CM

## 2023-07-05 MED ORDER — NITROFURANTOIN MONOHYD MACRO 100 MG PO CAPS: 100.0000 mg | ORAL_CAPSULE | Freq: Two times a day (BID) | ORAL | 0 refills | Status: AC

## 2023-07-07 ENCOUNTER — Encounter: Payer: Self-pay | Admitting: Family Medicine

## 2023-07-07 ENCOUNTER — Ambulatory Visit: Payer: Medicare PPO | Admitting: Family Medicine

## 2023-07-07 VITALS — BP 132/80 | HR 68 | Temp 98.3°F | Ht 66.0 in | Wt 142.4 lb

## 2023-07-07 DIAGNOSIS — I10 Essential (primary) hypertension: Secondary | ICD-10-CM

## 2023-07-07 DIAGNOSIS — F3342 Major depressive disorder, recurrent, in full remission: Secondary | ICD-10-CM

## 2023-07-07 DIAGNOSIS — E785 Hyperlipidemia, unspecified: Secondary | ICD-10-CM

## 2023-07-07 NOTE — Progress Notes (Signed)
Phone [phone] In person visit   Subjective:   Sharon Medina is a 67 y.o. year old very pleasant adult patient who presents for/with See problem oriented charting Chief Complaint  Patient presents with   Medical Management of Chronic Issues   Hypertension    Past Medical History-  Patient Active Problem List   Diagnosis Date Noted   Hyperlipidemia, unspecified 06/05/2020    Priority: Medium    Osteopenia 12/02/2017    Priority: Medium    GAD (generalized anxiety disorder) 12/02/2017    Priority: Medium    Major depression in full remission (HCC) 08/24/2007    Priority: Medium    Essential hypertension 08/24/2007    Priority: Medium    History of colonic polyps 06/13/2008    Priority: Low   Allergic rhinitis 08/24/2007    Priority: Low    Medications- reviewed and updated Current Outpatient Medications  Medication Sig Dispense Refill   amLODipine-valsartan (EXFORGE) 5-320 MG tablet TAKE 1 TABLET BY MOUTH DAILY 90 tablet 3   atenolol (TENORMIN) 50 MG tablet Take 1 tablet (50 mg total) by mouth daily. 90 tablet 2   buPROPion (WELLBUTRIN XL) 150 MG 24 hr tablet TAKE 2 TABLETS BY MOUTH EVERY DAY 180 tablet 3   Calcium Carbonate-Vitamin D (CALCIUM-D PO) Take by mouth.     Coenzyme Q10 (CO Q 10) 10 MG CAPS Take 1 tablet by mouth daily.     Estradiol 10 MCG TABS vaginal tablet INSERT 1 TABLET VAGINALLY 2 TIMES A WEEK 24 tablet 3   fluticasone (FLONASE) 50 MCG/ACT nasal spray SHAKE LIQUID AND USE 2 SPRAYS IN EACH NOSTRIL DAILY 48 g 1   glucosamine-chondroitin 500-400 MG tablet Take 1 tablet by mouth 2 (two) times daily as needed.     Menaquinone-7 (VITAMIN K2 PO) Take by mouth daily.     nitrofurantoin, macrocrystal-monohydrate, (MACROBID) 100 MG capsule Take 1 capsule (100 mg total) by mouth 2 (two) times daily for 7 days. 14 capsule 0   sertraline (ZOLOFT) 50 MG tablet Take 1 tablet (50 mg total) by mouth daily. 90 tablet 3   No current facility-administered  medications for this visit.     Objective:  BP 132/80   Pulse 68   Temp 98.3 F (36.8 C)   Ht 5\' 6"  (1.676 m)   Wt 142 lb 6.4 oz (64.6 kg)   LMP 10/02/2014   SpO2 98%   BMI 22.98 kg/m  Gen: NAD, resting comfortably CV: RRR no murmurs rubs or gallops Lungs: CTAB no crackles, wheeze, rhonchi Ext: no edema Skin: warm, dry     Assessment and Plan   #social update- [place] trip including cruise- has healed up from COVID afterwards  #hypertension S: medication: Atenolol 50 mg daily, amlodipine-valsartan 5-320 mg daily BP Readings from Last 3 Encounters:  07/07/23 132/80  07/01/23 116/82  02/14/23 112/74  A/P: stable- continue current medicines   #hyperlipidemia S: Medication:none  -No first-degree relatives with CAD Lab Results  Component Value Date   CHOL 214 (H) 12/30/2022   HDL 86.60 12/30/2022   LDLCALC 106 (H) 12/30/2022   TRIG 108.0 12/30/2022   CHOLHDL 2 12/30/2022  A/P: The 10-year ASCVD risk score (Arnett DK, et al., 2019) is: 8.7%. tries to eat heart healthy - wants to work on healthy eating and regular exercise and recheck in 6 months and consider CT calcium scoring if not improving   # Depression-recurrent S: Medication:Wellbutrin 150 mg extended release-takes 2 tablets daily, sertraline 50 mg -still even  with medicine gets some swings in mood including getting anxious at times    06/30/2023    9:06 AM 06/07/2023   11:06 AM 12/30/2022    9:41 AM  Depression screen PHQ 2/9  Decreased Interest 0 0 1  Down, Depressed, Hopeless 0 0 1  PHQ - 2 Score 0 0 2  Altered sleeping 0 0 1  Tired, decreased energy 0 0 0  Change in appetite 0 0 0  Feeling bad or failure about yourself  0 0 0  Trouble concentrating 0 0 0  Moving slowly or fidgety/restless 0 0 0  Suicidal thoughts   0  PHQ-9 Score 0 0 3  Difficult doing work/chores Not difficult at all Not difficult at all Not difficult at all  A/P: overall reasonable control/full remission- continue current  medications    Recommended follow up: Return in about 6 months (around 01/07/2024) for physical or sooner if needed.Schedule b4 you leave. Future Appointments  Date Time Provider Department Center  07/05/2024  9:45 AM [facility] ANNUAL WELLNESS VISIT 1 [facility]    Lab/Order associations:   ICD-10-CM   1. Essential hypertension  I10     2. Recurrent major depressive disorder, in full remission (HCC)  F33.42     3. Hyperlipidemia, unspecified hyperlipidemia type  E78.5       No orders of the defined types were placed in this encounter.   Return precautions advised.  Tana Conch, MD

## 2023-07-07 NOTE — Patient Instructions (Addendum)
Glad you are doing so well overall  Lets work on healthy eating and regular exercise and recheck cholesterol next visit  Recommended follow up: Return in about 6 months (around 01/07/2024) for physical or sooner if needed.Schedule b4 you leave.

## 2023-07-25 ENCOUNTER — Other Ambulatory Visit: Payer: Self-pay | Admitting: Family Medicine

## 2023-09-11 ENCOUNTER — Other Ambulatory Visit: Payer: Self-pay | Admitting: Family Medicine

## 2023-11-22 DIAGNOSIS — M7702 Medial epicondylitis, left elbow: Secondary | ICD-10-CM | POA: Diagnosis not present

## 2023-11-22 DIAGNOSIS — M25522 Pain in left elbow: Secondary | ICD-10-CM | POA: Diagnosis not present

## 2024-01-03 DIAGNOSIS — M7702 Medial epicondylitis, left elbow: Secondary | ICD-10-CM | POA: Diagnosis not present

## 2024-01-18 ENCOUNTER — Other Ambulatory Visit: Payer: Self-pay | Admitting: Family Medicine

## 2024-02-04 ENCOUNTER — Other Ambulatory Visit: Payer: Self-pay | Admitting: Family Medicine

## 2024-02-07 ENCOUNTER — Encounter: Payer: Self-pay | Admitting: Family Medicine

## 2024-02-07 ENCOUNTER — Ambulatory Visit (INDEPENDENT_AMBULATORY_CARE_PROVIDER_SITE_OTHER): Payer: Medicare PPO | Admitting: Family Medicine

## 2024-02-07 VITALS — BP 124/70 | HR 78 | Temp 97.1°F | Ht 66.0 in | Wt 139.2 lb

## 2024-02-07 DIAGNOSIS — E785 Hyperlipidemia, unspecified: Secondary | ICD-10-CM

## 2024-02-07 DIAGNOSIS — M8589 Other specified disorders of bone density and structure, multiple sites: Secondary | ICD-10-CM | POA: Diagnosis not present

## 2024-02-07 DIAGNOSIS — M7702 Medial epicondylitis, left elbow: Secondary | ICD-10-CM | POA: Diagnosis not present

## 2024-02-07 DIAGNOSIS — F3341 Major depressive disorder, recurrent, in partial remission: Secondary | ICD-10-CM

## 2024-02-07 DIAGNOSIS — Z0000 Encounter for general adult medical examination without abnormal findings: Secondary | ICD-10-CM | POA: Diagnosis not present

## 2024-02-07 DIAGNOSIS — I10 Essential (primary) hypertension: Secondary | ICD-10-CM | POA: Diagnosis not present

## 2024-02-07 LAB — COMPREHENSIVE METABOLIC PANEL
ALT: 12 U/L (ref 0–35)
AST: 16 U/L (ref 0–37)
Albumin: 4.1 g/dL (ref 3.5–5.2)
Alkaline Phosphatase: 47 U/L (ref 39–117)
BUN: 12 mg/dL (ref 6–23)
CO2: 31 meq/L (ref 19–32)
Calcium: 9 mg/dL (ref 8.4–10.5)
Chloride: 101 meq/L (ref 96–112)
Creatinine, Ser: 0.92 mg/dL (ref 0.40–1.20)
GFR: 64.34 mL/min (ref >60.00)
Glucose, Bld: 85 mg/dL (ref 70–99)
Potassium: 4 meq/L (ref 3.5–5.1)
Sodium: 137 meq/L (ref 135–145)
Total Bilirubin: 0.5 mg/dL (ref 0.2–1.2)
Total Protein: 6.6 g/dL (ref 6.0–8.3)

## 2024-02-07 LAB — LIPID PANEL
Cholesterol: 190 mg/dL (ref 0–200)
HDL: 84.5 mg/dL (ref >39.00)
LDL Cholesterol: 94 mg/dL (ref 0–99)
NonHDL: 105.35
Total CHOL/HDL Ratio: 2
Triglycerides: 56 mg/dL (ref 0.0–149.0)
VLDL: 11.2 mg/dL (ref 0.0–40.0)

## 2024-02-07 LAB — CBC WITH DIFFERENTIAL/PLATELET
Basophils Absolute: 0 10*3/uL (ref 0.0–0.1)
Basophils Relative: 1.1 % (ref 0.0–3.0)
Eosinophils Absolute: 0.1 10*3/uL (ref 0.0–0.7)
Eosinophils Relative: 3.7 % (ref 0.0–5.0)
HCT: 38.7 % (ref 36.0–46.0)
Hemoglobin: 12.9 g/dL (ref 12.0–15.0)
Lymphocytes Relative: 34.5 % (ref 12.0–46.0)
Lymphs Abs: 1.3 10*3/uL (ref 0.7–4.0)
MCHC: 33.3 g/dL (ref 30.0–36.0)
MCV: 91.3 fL (ref 78.0–100.0)
Monocytes Absolute: 0.3 10*3/uL (ref 0.1–1.0)
Monocytes Relative: 8.5 % (ref 3.0–12.0)
Neutro Abs: 2 10*3/uL (ref 1.4–7.7)
Neutrophils Relative %: 52.2 % (ref 43.0–77.0)
Platelets: 238 10*3/uL (ref 150.0–400.0)
RBC: 4.24 Mil/uL (ref 3.87–5.11)
RDW: 13.4 % (ref 11.5–15.5)
WBC: 3.9 10*3/uL — ABNORMAL LOW (ref 4.0–10.5)

## 2024-02-07 NOTE — Patient Instructions (Addendum)
 Tetanus, Diphtheria, and Pertussis (Tdap) due at pharmacy  Please call [phone] to schedule a visit with [facility] - please tell the office you were directly referred by Dr. Durene Cal- we do have Dr. Monna Fam in our office  [place] sometimes can be backed up for up to 2 months so here are some other options to check in on: - [email or web address]  at  [phone] [facility] [email or web address] at [phone]  Please stop by lab before you go If you have mychart- we will send your results within 3 business days of [place] receiving them.  If you do not have mychart- we will call you about results within 5 business days of [place] receiving them.  *please also note that you will see labs on mychart as soon as they post. I will later go in and write notes on them- will say "notes from Dr. Durene Cal"    Recommended follow up: Return in about 6 months (around 08/06/2024) for followup or sooner if needed.Schedule b4 you leave. Or sooner  if needed with depression

## 2024-02-07 NOTE — Progress Notes (Signed)
 Phone [phone]   Subjective:  Patient presents today for their annual physical. Chief complaint-noted.   See problem oriented charting- ROS- full  review of systems was completed and negative Per full ROS sheet completed by patient other than depression/stress, elbow pain, hand pain and occasional tingling  The following were reviewed and entered/updated in epic: Past Medical History:  Diagnosis Date   Allergy    SEASONAL   Anxiety    Arthritis    HANDS,FOOT -LEFT BIG TOE   Cataract    BILATERAL,SMALL   Claustrophobia    severe   Depression    GERD (gastroesophageal reflux disease)    H/O seasonal allergies    Hemorrhoids    Hypertension    Osteopenia    Postmenopausal HRT (hormone replacement therapy)    per pt, never took oral HR meds   Patient Active Problem List   Diagnosis Date Noted   Hyperlipidemia, unspecified 06/05/2020    Priority: Medium    Osteopenia 12/02/2017    Priority: Medium    GAD (generalized anxiety disorder) 12/02/2017    Priority: Medium    Major depression in full remission (HCC) 08/24/2007    Priority: Medium    Essential hypertension 08/24/2007    Priority: Medium    History of colonic polyps 06/13/2008    Priority: Low   Allergic rhinitis 08/24/2007    Priority: Low   Past Surgical History:  Procedure Laterality Date   CESAREAN SECTION     x2   COLONOSCOPY     POLYPECTOMY     WISDOM TOOTH EXTRACTION      Family History  Problem Relation Age of Onset   Hypertension Mother        sister as well   Prostate cancer Father        smoker   Hypertension Father    Ulcers Father    Cancer Father    Colon polyps Sister    Gout Sister    Hypertension Sister    Arthritis Sister    Obesity Sister    Gout Sister        brother   Gout Brother    Heart disease Brother    Colon cancer Paternal Aunt    Colon cancer Paternal Aunt    Cancer Paternal Aunt    Depression Maternal Grandmother    Cancer Paternal Grandmother    ADD  / ADHD Son    Anxiety disorder Son    Learning disabilities Son    Anxiety disorder Daughter    Cancer Paternal Aunt    Cancer Paternal Aunt    Crohn's disease Neg Hx    Esophageal cancer Neg Hx    Rectal cancer Neg Hx    Stomach cancer Neg Hx    Ulcerative colitis Neg Hx     Medications- reviewed and updated Current Outpatient Medications  Medication Sig Dispense Refill   amLODipine-valsartan (EXFORGE) 5-320 MG tablet TAKE 1 TABLET BY MOUTH DAILY 90 tablet 3   atenolol (TENORMIN) 50 MG tablet TAKE 1 TABLET(50 MG) BY MOUTH DAILY 90 tablet 2   buPROPion (WELLBUTRIN XL) 150 MG 24 hr tablet TAKE 2 TABLETS BY MOUTH EVERY DAY 180 tablet 3   Calcium Carbonate-Vitamin D (CALCIUM-D PO) Take by mouth.     Coenzyme Q10 (CO Q 10) 10 MG CAPS Take 1 tablet by mouth daily.     Estradiol 10 MCG TABS vaginal tablet INSERT 1 TABLET VAGINALLY 2 TIMES A WEEK 24 tablet 3   fluticasone (FLONASE)  50 MCG/ACT nasal spray SHAKE LIQUID AND USE 2 SPRAYS IN EACH NOSTRIL DAILY 48 g 1   glucosamine-chondroitin 500-400 MG tablet Take 1 tablet by mouth 2 (two) times daily as needed.     Menaquinone-7 (VITAMIN K2 PO) Take by mouth daily.     sertraline (ZOLOFT) 50 MG tablet TAKE 1 TABLET(50 MG) BY MOUTH DAILY 90 tablet 3   No current facility-administered medications for this visit.    Allergies-reviewed and updated Allergies  Allergen Reactions   Hydrochlorothiazide     REACTION: rash    Social History   Social History Narrative   Married. 2 living children, 1 died at birth. 4 grandchildren, 1 step grandchild.    Lives with husband.       Retired 2019   Former teacher-retired.    Prior with [employer]- caps and gowns etc. Stressful boss- no woff      Hobbies: rummy cube (tile game), antiquing, cooking, gardening   Objective  Objective:  BP 124/70   Pulse 78   Temp (!) 97.1 F (36.2 C)   Ht 5\' 6"  (1.676 m)   Wt 139 lb 3.2 oz (63.1 kg)   LMP 10/02/2014   SpO2 99%   BMI 22.47 kg/m  Gen:  NAD, resting comfortably HEENT: Mucous membranes are moist. Oropharynx normal Neck: no thyromegaly CV: RRR no murmurs rubs or gallops Lungs: CTAB no crackles, wheeze, rhonchi Abdomen: soft/nontender/nondistended/normal bowel sounds. No rebound or guarding.  Ext: no edema Skin: warm, dry Neuro: grossly normal, moves all extremities, PERRLA   Assessment and Plan   68 y.o. adult presenting for annual physical.  Health Maintenance counseling: 1. Anticipatory guidance: Patient counseled regarding regular dental exams -q6 months, eye exams - yearly,  avoiding smoking and second hand smoke , limiting alcohol to 1 beverage per day- 1/day , no illicit drugs .   2. Risk factor reduction:  Advised patient of need for regular exercise and diet rich and fruits and vegetables to reduce risk of heart attack and stroke.  Exercise- harder with winter but hoping to improve as weather gets better- was at the beach and able to get out there.  Diet/weight management-weight down 3 pounds from last year-still very healthy weight and diet.  Wt Readings from Last 3 Encounters:  02/07/24 139 lb 3.2 oz (63.1 kg)  07/07/23 142 lb 6.4 oz (64.6 kg)  06/30/23 140 lb (63.5 kg)  3. Immunizations/screenings/ancillary studies-discussed Tdap at pharmacy  Immunization History  Administered Date(s) Administered   Fluad Quad(high Dose 65+) 10/06/2020   Fluad Trivalent(High Dose 65+) 10/18/2023   Influenza Split 09/10/2011, 09/18/2012   Influenza Whole 09/07/2010   Influenza, High Dose Seasonal PF 10/03/2022   Influenza,inj,Quad PF,6+ Mos 09/24/2013, 09/25/2014, 10/16/2015, 09/17/2016, 08/17/2017, 11/06/2018, 08/22/2019, 10/02/2021   Influenza,inj,quad, With Preservative 11/06/2018   Influenza-Unspecified 08/17/2017, 08/22/2019   Moderna Covid-19 Fall Seasonal Vaccine 32yrs & older 03/29/2023, 10/10/2023   Moderna Covid-19 Vaccine Bivalent Booster 33yrs & up 07/16/2021, 10/02/2021   Moderna SARS-COV2 Booster Vaccination  06/25/2022, 11/30/2022   Moderna Sars-Covid-2 Vaccination 02/24/2020, 03/23/2020, 10/16/2020   PNEUMOCOCCAL CONJUGATE-20 06/11/2021   PPD Test 04/10/2012   Respiratory Syncytial Virus Vaccine,Recomb Aduvanted(Arexvy) 09/16/2022   Td 12/20/2000   Tdap 09/18/2012   Zoster Recombinant(Shingrix) 12/08/2020, 04/09/2021  4. Cervical cancer screening-still sees GYN and decision on Pap smear for 65 made with them-Tiffany Dan Humphreys, DNP.  Atrophic vaginitis on vaginal estrogen  5. Breast cancer screening-  breast exam with GYN and mammogram 04/06/2023 6. Colon cancer screening -  History of precancerous polyps-patient with precancerous polyp on later 09/29/2022 with Dr. Russella Dar with plan for 3-year follow-up  7. Skin cancer screening-regularly sees dermatologist.  advised regular sunscreen use. Denies worrisome, changing, or new skin lesions.  8. Birth control/STD check- monogamous/postmenopausal  9. Osteoporosis screening at 65-April 05, 2023 with GYN thankfully stable-discussed calcium/vitamin D/weightbearing exercise goals  10. Smoking associated screening -never smoker   Status of chronic or acute concerns   #Elbow issues- seeing Dr. Frazier Butt this afternoon  - also with some hand arthritis. Occasional hand tingling- but carpal tunnel is better  #hypertension S: medication: Atenolol 50 mg daily, amlodipine-valsartan 5-320 mg daily -Off losartan-had issues with cough/choking sensation BP Readings from Last 3 Encounters:  02/07/24 124/70  07/07/23 132/80  07/01/23 116/82  A/P: well controlled continue current medications   #hyperlipidemia S: Medication: None -No first-degree relatives with CAD and ASCVD risk under 7.5% in the past-at 7.7% if lipids are stable  Lab Results  Component Value Date   CHOL 214 (H) 12/30/2022   HDL 86.60 12/30/2022   LDLCALC 106 (H) 12/30/2022   TRIG 108.0 12/30/2022   CHOLHDL 2 12/30/2022  A/P: mild elevations but 10 year risk may be above 7.5% and we will order  CT calcium if so    # Depression-recurrent S: Medication: Wellbutrin 150 mg extended release-takes 2 tablets daily, sertraline 50 mg -a lot of stress with adult children - particularly her son    02/07/2024    8:38 AM 06/30/2023    9:06 AM 06/07/2023   11:06 AM  Depression screen PHQ 2/9  Decreased Interest 3 0 0  Down, Depressed, Hopeless 3 0 0  PHQ - 2 Score 6 0 0  Altered sleeping 2 0 0  Tired, decreased energy 1 0 0  Change in appetite 0 0 0  Feeling bad or failure about yourself  0 0 0  Trouble concentrating 1 0 0  Moving slowly or fidgety/restless 0 0 0  Suicidal thoughts 0    PHQ-9 Score 10 0 0  Difficult doing work/chores Somewhat difficult Not difficult at all Not difficult at all  A/P: depression only in partial remission due to situational stressor- refer for therapy today-  Recommended follow up: Return in about 6 months (around 08/06/2024) for followup or sooner if needed.Schedule b4 you leave. Future Appointments  Date Time Provider Department Center  02/16/2024 10:30 AM Wyline Beady A, NP [facility] None  04/30/2024  1:10 PM [facility] None  07/05/2024  9:45 AM [facility] ANNUAL WELLNESS VISIT 1 [facility]   Lab/Order associations: fasting   ICD-10-CM   1. Preventative health care  Z00.00     2. Essential hypertension  I10     3. Hyperlipidemia, unspecified hyperlipidemia type  E78.5 Comprehensive metabolic panel    CBC with Differential/Platelet    Lipid panel    4. Recurrent major depressive disorder, in partial remission (HCC)  F33.41 Ambulatory referral to Psychology    5. Osteopenia of multiple sites  M85.89       No orders of the defined types were placed in this encounter.   Return precautions advised.  Tana Conch, MD

## 2024-02-08 ENCOUNTER — Other Ambulatory Visit: Payer: Self-pay | Admitting: Nurse Practitioner

## 2024-02-08 DIAGNOSIS — N952 Postmenopausal atrophic vaginitis: Secondary | ICD-10-CM

## 2024-02-08 NOTE — Telephone Encounter (Signed)
 Med refill request: estradiol tablet Last AEX: 02/15/22, Last OV: 07/01/23 Next AEX: 02/16/24 Last MMG (if hormonal med) 04/06/23 Refill authorized: Please Advise, #24, 0 RF

## 2024-02-15 ENCOUNTER — Ambulatory Visit: Payer: Medicare PPO | Admitting: Nurse Practitioner

## 2024-02-16 ENCOUNTER — Encounter: Payer: Self-pay | Admitting: Nurse Practitioner

## 2024-02-16 ENCOUNTER — Ambulatory Visit (INDEPENDENT_AMBULATORY_CARE_PROVIDER_SITE_OTHER): Payer: Medicare PPO | Admitting: Nurse Practitioner

## 2024-02-16 VITALS — BP 126/84 | HR 87 | Ht 65.35 in | Wt 140.0 lb

## 2024-02-16 DIAGNOSIS — M8589 Other specified disorders of bone density and structure, multiple sites: Secondary | ICD-10-CM

## 2024-02-16 DIAGNOSIS — N952 Postmenopausal atrophic vaginitis: Secondary | ICD-10-CM

## 2024-02-16 DIAGNOSIS — Z01419 Encounter for gynecological examination (general) (routine) without abnormal findings: Secondary | ICD-10-CM | POA: Diagnosis not present

## 2024-02-16 DIAGNOSIS — Z780 Asymptomatic menopausal state: Secondary | ICD-10-CM

## 2024-02-16 DIAGNOSIS — Z124 Encounter for screening for malignant neoplasm of cervix: Secondary | ICD-10-CM

## 2024-02-16 MED ORDER — ESTRADIOL 10 MCG VA TABS: ORAL_TABLET | VAGINAL | 3 refills | Status: DC

## 2024-02-16 NOTE — Addendum Note (Signed)
 Addended byWyline Beady on: 02/16/2024 11:33 AM   Modules accepted: Orders

## 2024-02-16 NOTE — Progress Notes (Addendum)
 Sharon Medina February 12, 1956 [id number]   History:  68 y.o. G3P2 presents for annual exam. Postmenopausal, uses vaginal estradiol for dryness and irritation. Normal pap and mammogram history. HTN, depression managed by PCP.   Gynecologic History Patient's last menstrual period was 10/02/2014.   Contraception/Family planning: post menopausal status Sexually active: Yes  Health Maintenance Last Pap: 02/04/2021. Results were: Normal, 3-year repeat Last mammogram: 04/06/2023. Results were:  Normal Last colonoscopy: 09/13/2022. Results were: Tubular adenoma, SSP, 3-year recall Last Dexa: 04/05/2023. Results were: T-score -1.8, FRAX 9.8% / 1.4% Exercising: Yes. Walking  Smoker: no   Past medical history, past surgical history, family history and social history were all reviewed and documented in the EPIC chart. Married. Retired. 2 children, live in [place] area. 2 paternal aunts with history of colon cancer.   ROS:  A ROS was performed and pertinent positives and negatives are included.  Exam:  Vitals:   02/16/24 1013  BP: 126/84  Pulse: 87  SpO2: 99%  Weight: 140 lb (63.5 kg)  Height: 5' 5.35" (1.66 m)     Body mass index is 23.05 kg/m.  General appearance:  Normal Thyroid:  Symmetrical, normal in size, without palpable masses or nodularity. Respiratory  Auscultation:  Clear without wheezing or rhonchi Cardiovascular  Auscultation:  Regular rate, without rubs, murmurs or gallops  Edema/varicosities:  Not grossly evident Abdominal  Soft,nontender, without masses, guarding or rebound.  Liver/spleen:  No organomegaly noted  Hernia:  None appreciated  Skin  Inspection:  Grossly normal   Breasts: Examined lying and sitting.   Right: Without masses, retractions, discharge or axillary adenopathy.   Left: Without masses, retractions, discharge or axillary adenopathy. Pelvic: External genitalia:  no lesions              Urethra:  normal appearing urethra with no masses,  tenderness or lesions              Bartholins and Skenes: normal                 Vagina: normal appearing vagina with normal color and discharge, no lesions. Atrophic changes              Cervix: no lesions Bimanual Exam:  Uterus:  no masses or tenderness              Adnexa: no mass, fullness, tenderness              Rectovaginal: Deferred              Anus:  normal, no lesions  Patient informed chaperone available to be present for breast and pelvic exam. Patient has requested no chaperone to be present. Patient has been advised what will be completed during breast and pelvic exam.   Assessment/Plan:  68 y.o. G3P2 for breast and pelvic exam.   Encounter for breast and pelvic examination - Education provided on SBEs, importance of preventative screenings, current guidelines, high calcium diet, regular exercise, and multivitamin daily. Labs with PCP.   Osteopenia of multiple sites - 03/2023 T-score -1.8 without elevated FRAX. Continue Vitamin D supplement and incorporate weightbearing exercises into routine. Very active with gardening.   Postmenopausal - no HRT, no bleeding  Postmenopausal atrophic vaginitis - Plan: Estradiol 10 MCG TABS vaginal tablet twice weekly with good management. Doing well on this and wants to continue. Refill x 1 year provided.   Cervical cancer screening - Normal Pap history. Option to continue screening discussed.   Screening for breast  cancer - Normal mammogram history.  Continue annual screenings. Normal breast exam today.   Screening for colon cancer - 2023 colonoscopy. Will repeat at GI's recommended interval.   Return in about 1 year (around 02/15/2025) for Med follow up.      Sharon Medina [facility], 10:41 AM 02/16/2024

## 2024-03-20 ENCOUNTER — Ambulatory Visit (INDEPENDENT_AMBULATORY_CARE_PROVIDER_SITE_OTHER): Admitting: Licensed Clinical Social Worker

## 2024-03-20 DIAGNOSIS — F419 Anxiety disorder, unspecified: Secondary | ICD-10-CM

## 2024-03-20 DIAGNOSIS — F329 Major depressive disorder, single episode, unspecified: Secondary | ICD-10-CM | POA: Diagnosis not present
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
## 2024-03-27 ENCOUNTER — Ambulatory Visit (INDEPENDENT_AMBULATORY_CARE_PROVIDER_SITE_OTHER): Admitting: Licensed Clinical Social Worker

## 2024-03-27 DIAGNOSIS — F4321 Adjustment disorder with depressed mood: Secondary | ICD-10-CM

## 2024-03-27 DIAGNOSIS — F329 Major depressive disorder, single episode, unspecified: Secondary | ICD-10-CM | POA: Diagnosis not present

## 2024-03-27 DIAGNOSIS — F419 Anxiety disorder, unspecified: Secondary | ICD-10-CM | POA: Diagnosis not present

## 2024-03-27 NOTE — Progress Notes (Signed)
  Behavioral Health Counselor/Therapist Progress Note  Patient ID: Sharon Medina, MRN: [id number]    Date: 03/27/24  Time Spent: 9:03  am - 9:59 am : 53 Minutes  Treatment Type: Individual Therapy.  Reported Symptoms: family member with serious financial issues that she is concerned about  Mental Status Exam: Appearance:  Neat     Behavior: Appropriate  Motor: Normal  Speech/Language:  Normal Rate  Affect: Appropriate  Mood: normal  Thought process: normal  Thought content:   WNL  Sensory/Perceptual disturbances:   WNL  Orientation: oriented to person, place, and time/date  Attention: Good  Concentration: Good  Memory: WNL  Fund of knowledge:  Good  Insight:   Good  Judgment:  Good  Impulse Control: Good   Risk Assessment: Danger to Self:  No Self-injurious Behavior: No Danger to Others: No Duty to Warn:no Physical Aggression / Violence:No  Access to Firearms a concern: No  Gang Involvement:No   Subjective:   Sharon Medina participated in the session, in person in the office with the therapist, and consented to treatment Sharon Medina reviewed the events of the past week.      We reviewed numerous treatment approaches including CBT and Solution focused therapy. Psych-education regarding the Sharon Medina's diagnosis of No diagnosis found. was provided during the session. We discussed Sharon Medina's goals treatment goals which include  goal of identifying fears that drive her anxiety and need for control. Sharon Medina provided verbal approval of the treatment plan.    Interventions: Psycho-education & Goal Setting.   Diagnosis:   Anxiety-Reactive Depression  Support System: Family and Friends  Client Treatment Preferences In person  Client Statement of Needs Sharon Medina would like to focus on not being responsible for others issues and a need to resolve.     Treatment Level Weekly  Symptoms  Afraid   (Status: maintained) Resentment    (Status: maintained)  Goals:   Sharon Medina agreed to setting goal of identifying fears that drive her anxiety and need for control.  Appears to want to maintain control over sons marriage due to fear of having to bury her son who died 3 hours after birth and the loss of a boyfriend by suicide as well.    Treatment plan signed and available on s-drive:  Yes    Target Date: 05/08/24 Frequency: Bi-Weekly  Progress: 0 Modality: individual    Therapist will provide referrals for additional resources as appropriate.  Therapist will provide psycho-education regarding Sharon Medina's diagnosis and corresponding treatment approaches and interventions. Licensed Clinical Mental Health Counselor, Sharon Medina, [facility], will support the patient's ability to achieve the goals identified. will employ CBT, BA, Problem-solving, Solution Focused, Mindfulness,  coping skills, & other evidenced-based practices will be used to promote progress towards healthy functioning to help manage decrease symptoms associated with her diagnosis.   Reduce overall level, frequency, and intensity of the feelings of depression, anxiety and panic evidenced by decreased need for control and crippling fear causing tension and increased anxiety.   Verbally express understanding of the relationship between feelings of depression, anxiety and their impact on thinking patterns and behaviors. Verbalize an understanding of the role that distorted thinking plays in creating fears, excessive worry, and ruminations.  Sharon Medina participated in the creation of the treatment plan)    Sharon Medina, [facility]

## 2024-04-10 ENCOUNTER — Ambulatory Visit (INDEPENDENT_AMBULATORY_CARE_PROVIDER_SITE_OTHER): Admitting: Licensed Clinical Social Worker

## 2024-04-10 DIAGNOSIS — F419 Anxiety disorder, unspecified: Secondary | ICD-10-CM

## 2024-04-10 NOTE — Progress Notes (Signed)
 [facility] Counselor/Therapist Progress Note  Patient ID: Timberlyn Pickford, MRN: [id number]    Date: 04/10/24  Time Spent: 9:01  am - 9:59 am : 59 Minutes  Treatment Type: Individual Therapy.  Reported Symptoms:   Mental Status Exam: Appearance:  Well Groomed     Behavior: Sharing and Rationalizing  Motor: Normal  Speech/Language:  Normal Rate  Affect: Appropriate  Mood: normal  Thought process: normal  Thought content:   WNL  Sensory/Perceptual disturbances:   WNL  Orientation: oriented to person, place, and time/date  Attention: Good  Concentration: Good  Memory: WNL  Fund of knowledge:  Good  Insight:   Good  Judgment:  Good  Impulse Control: Good   Risk Assessment: Danger to Self:  No Self-injurious Behavior: No Danger to Others: No Duty to Warn:no Physical Aggression / Violence:No  Access to Firearms a concern: No  Gang Involvement:No   Subjective:   Dezyrae Kensinger Grothe participated in the session, in person in the office with the therapist, and consented to treatment. Sharetta reviewed the events of the past week.      Interventions: Psychologist, occupational and Insight-Oriented  Diagnosis:   Anxiety  Psychiatric Treatment: No , N/A  Treatment Plan:  Client Abilities/Strengths Misha is attending sessions in order to find relief and skills to manage her anxiety and center of control.    Support System: Family and Friends  Hospital doctor Preferences In person  Client Statement of Needs Morelia would like to have a sense of control over her son's well being.    Treatment Level Biweekly  Symptoms  Fear   (Status: maintained) Controlling   (Status: maintained)  Goals:   Moncia is currently reading a book called [employer] that has helped her stop and think about not dwelling in the past and moving forward.  Does not want to be stuck in this part of her now.  Life is moving forward and the situation with her son is not.  To learn at her age now that he is autistic and wants to independently navigate this journey without their support.  Has been having a hard time does and appears to be focused on    Target Date: 05/08/24 Frequency: Biweekly  Progress: 0 Modality: individual    Therapist will provide referrals for additional resources as appropriate.  Therapist will provide psycho-education regarding November's diagnosis and corresponding treatment approaches and interventions. Licensed Clinical Mental Health Counselor, Grandville Lax, [facility] will support the patient's ability to achieve the goals identified. will employ CBT, BA, Problem-solving, Solution Focused, Mindfulness,  coping skills, & other evidenced-based practices will be used to promote progress towards healthy functioning to help manage decrease symptoms associated with her diagnosis.   Reduce overall level, frequency, and intensity of the feelings of depression, anxiety and panic evidenced by decreased anger about not being able to control the situation.   Verbally express understanding of the relationship between feelings of depression, anxiety and their impact on thinking patterns and behaviors. Verbalize an understanding of the role that distorted thinking plays in creating fears, excessive worry, and ruminations.  Mariah Shines participated in the creation of the treatment plan)      Nita Whitmire, LCMHC

## 2024-04-26 ENCOUNTER — Telehealth: Payer: Self-pay | Admitting: *Deleted

## 2024-04-26 NOTE — Telephone Encounter (Signed)
 LVM to confirm appt for 5/12 - included address and time - [facility] 04/26/24

## 2024-04-30 ENCOUNTER — Encounter (INDEPENDENT_AMBULATORY_CARE_PROVIDER_SITE_OTHER): Payer: Self-pay | Admitting: Otolaryngology

## 2024-04-30 ENCOUNTER — Ambulatory Visit (INDEPENDENT_AMBULATORY_CARE_PROVIDER_SITE_OTHER): Payer: Medicare PPO | Admitting: Otolaryngology

## 2024-04-30 VITALS — BP 157/88 | HR 72 | Ht 66.0 in | Wt 138.0 lb

## 2024-04-30 DIAGNOSIS — J310 Chronic rhinitis: Secondary | ICD-10-CM

## 2024-04-30 DIAGNOSIS — J342 Deviated nasal septum: Secondary | ICD-10-CM | POA: Diagnosis not present

## 2024-04-30 DIAGNOSIS — J343 Hypertrophy of nasal turbinates: Secondary | ICD-10-CM | POA: Diagnosis not present

## 2024-04-30 DIAGNOSIS — H6983 Other specified disorders of Eustachian tube, bilateral: Secondary | ICD-10-CM

## 2024-04-30 DIAGNOSIS — R0981 Nasal congestion: Secondary | ICD-10-CM

## 2024-04-30 DIAGNOSIS — H903 Sensorineural hearing loss, bilateral: Secondary | ICD-10-CM

## 2024-04-30 DIAGNOSIS — H6980 Other specified disorders of Eustachian tube, unspecified ear: Secondary | ICD-10-CM

## 2024-04-30 MED ORDER — FLUTICASONE PROPIONATE 50 MCG/ACT NA SUSP: 2.0000 | Freq: Every day | NASAL | 10 refills | Status: DC

## 2024-04-30 NOTE — Progress Notes (Unsigned)
 Patient ID: Sharon Medina, adult   DOB: 1956/02/16, 68 y.o.   MRN: [id number]  Follow-up: Chronic nasal congestion, eustachian tube dysfunction, hearing loss  The patient is a 68 year old female who returns today for her follow-up evaluation.  She was last seen 6 months ago.  At that time, she was noted to have chronic nasal congestion, bilateral eustachian tube dysfunction, and bilateral high-frequency sensorineural hearing loss.  She was treated with Flonase  nasal spray and Valsalva exercise.  The patient returns today reporting improvement in her nasal and ear discomfort.  She is able to breathe through both nostrils.  She has noted mild nasal congestion during the allergy season.  Currently she denies any facial pain, fever, visual change, otalgia, or otorrhea.   Objective Objective note General: Communicates without difficulty, well nourished, no acute distress. Head: Normocephalic, no evidence injury, no tenderness, facial buttresses intact without stepoff. Face/sinus: No tenderness to palpation and percussion. Facial movement is normal and symmetric. Eyes: PERRL, EOMI. No scleral icterus, conjunctivae clear. Neuro: CN II exam reveals vision grossly intact.  No nystagmus at any point of gaze. Ears: Auricles well formed without lesions.  Ear canals are intact without mass or lesion.  No erythema or edema is appreciated.  The TMs are intact without fluid. Nose: External evaluation reveals normal support and skin without lesions.  Dorsum is intact.  Anterior rhinoscopy reveals congested mucosa over anterior aspect of inferior turbinates and intact septum.  No purulence noted. Oral:  Oral cavity and oropharynx are intact, symmetric, without erythema or edema.  Mucosa is moist without lesions. Neck: Full range of motion without pain.  There is no significant lymphadenopathy.  No masses palpable.  Thyroid  bed within normal limits to palpation.  Parotid glands and submandibular glands equal bilaterally  without mass.  Trachea is midline. Neuro:  CN 2-12 grossly intact.     Procedure:  Flexible Nasal Endoscopy: Description: Risks, benefits, and alternatives of flexible endoscopy were explained to the patient.  Specific mention was made of the risk of throat numbness with difficulty swallowing, possible bleeding from the nose and mouth, and pain from the procedure.  The patient gave oral consent to proceed.  The flexible scope was inserted into the right nasal cavity.  Endoscopy of the interior nasal cavity, superior, inferior, and middle meatus was performed. The sphenoid-ethmoid recess was examined. Edematous mucosa was noted.  No polyp, mass, or lesion was appreciated.  Nasal septal deviation noted.  Olfactory cleft was clear.  Nasopharynx was clear.  Turbinates were hypertrophied but without mass.  The procedure was repeated on the contralateral side with similar findings.  The patient tolerated the procedure well.     AUDIOMETRIC TESTING: I have read and reviewed the audiometric test, which shows bilateral mild high-frequency sensorineural hearing loss. The speech reception threshold is 10dB AD and 10dB AS. The discrimination score is 96% AD and 100% AS. The tympanogram is normal bilaterally.    Observations Functional status No functional status recorded  Cognitive status No cognitive status recorded  Assessment Assessment note 1.  Chronic rhinitis with nasal mucosal congestion, nasal septal deviation, and bilateral inferior turbinate hypertrophy. The severity of the nasal congestion has decreased.  2.  Clinically improved eustachian tube dysfunction.  3.  Stable bilateral mild high-frequency sensorineural hearing loss.   Screenings/Interventions/Assessments No screenings/interventions/assessments recorded  Diagnoses attached to encounter No diagnoses attached  Plan Plan note 1.  The physical exam and nasal endoscopy findings are reviewed with the patient.  The hearing test  results also reviewed.  2.  Continue with Flonase  nasal spray daily during the allergy months.  3.  Daily Valsalva exercise.  4.  The patient will return for reevaluation in 1 year.

## 2024-05-01 DIAGNOSIS — J310 Chronic rhinitis: Secondary | ICD-10-CM | POA: Insufficient documentation

## 2024-05-01 DIAGNOSIS — H6983 Other specified disorders of Eustachian tube, bilateral: Secondary | ICD-10-CM | POA: Insufficient documentation

## 2024-05-01 DIAGNOSIS — J342 Deviated nasal septum: Secondary | ICD-10-CM | POA: Insufficient documentation

## 2024-05-01 DIAGNOSIS — J343 Hypertrophy of nasal turbinates: Secondary | ICD-10-CM | POA: Insufficient documentation

## 2024-05-01 DIAGNOSIS — H903 Sensorineural hearing loss, bilateral: Secondary | ICD-10-CM | POA: Insufficient documentation

## 2024-05-02 ENCOUNTER — Ambulatory Visit (INDEPENDENT_AMBULATORY_CARE_PROVIDER_SITE_OTHER): Admitting: Licensed Clinical Social Worker

## 2024-05-02 DIAGNOSIS — F419 Anxiety disorder, unspecified: Secondary | ICD-10-CM

## 2024-05-02 DIAGNOSIS — F329 Major depressive disorder, single episode, unspecified: Secondary | ICD-10-CM | POA: Diagnosis not present

## 2024-05-02 NOTE — Progress Notes (Signed)
 [facility] Counselor/Therapist Progress Note  Patient ID: Duru Wiltgen, MRN: [id number]    Date: 05/02/24  Time Spent: 9:01  am - 10:01 am : 60 Minutes  Treatment Type: Individual Therapy.  Reported Symptoms: stuck in her own judgement and taking things personal to how she feels that things should be done based on her past mothering experiences  Mental Status Exam: Appearance:  Well Groomed     Behavior: Appropriate  Motor: Normal  Speech/Language:  Normal Rate  Affect: Appropriate  Mood: normal  Thought process: normal  Thought content:   WNL  Sensory/Perceptual disturbances:   WNL  Orientation: oriented to person, place, and time/date  Attention: Good  Concentration: Good  Memory: WNL  Fund of knowledge:  Good  Insight:   Good  Judgment:  Good  Impulse Control: Good   Risk Assessment: Danger to Self:  No Self-injurious Behavior: No Danger to Others: No Duty to Warn:no Physical Aggression / Violence:No  Access to Firearms a concern: No  Gang Involvement:No   Subjective:   Loyola Rummage participated from home, via video, and consented to treatment. I discussed the limitations of evaluation and management by telemedicine and the availability of in person appointments. The patient expressed understanding and agreed to proceed.  Therapist participated from office.  Sochil reviewed the events of the past week.       Interventions: Mindfulness Meditation and Insight-Oriented  Diagnosis:  Anxiety and Reactive Depression  Psychiatric Treatment: No , N/A  Treatment Plan:  Client Abilities/Strengths Krystle is open and receptive to sessions.    Support System: Family and Friends  Hospital doctor Preferences In person and Scientist, product/process development of Needs Janah would like to    Treatment Level Biweekly  Symptoms  Resistant   (Status: declined) Judgmental   (Status: declined)  Goals:   Mekiah experiences symptoms of not being  able to accept and adjust her parenting style and desires.  She appears to be stuck in emotional mind and wanting to control the narrative.  Does not see her responsibility in pushing in and forcing things while passing judgement on her sons relationship.  Stagnant growth discussed with patient at this time.       Target Date: 05/08/24 Frequency: Biweekly  Progress: 0 Modality: individual    Therapist will provide referrals for additional resources as appropriate.  Therapist will provide psycho-education regarding Iszabella's diagnosis and corresponding treatment approaches and interventions. Licensed Clinical Mental Health Counselor, Grandville Lax, [facility] will support the patient's ability to achieve the goals identified. will employ CBT, BA, Problem-solving, Solution Focused, Mindfulness,  coping skills, & other evidenced-based practices will be used to promote progress towards healthy functioning to help manage decrease symptoms associated with her diagnosis.   Reduce overall level, frequency, and intensity of the feelings of depression, anxiety and panic evidenced by increased self awareness and responsibility to change.   Verbally express understanding of the relationship between feelings of depression, anxiety and their impact on thinking patterns and behaviors. Verbalize an understanding of the role that distorted thinking plays in creating fears, excessive worry, and ruminations.  Mariah Shines participated in the creation of the treatment plan)   Mandi Mattioli, LCMHC

## 2024-05-07 DIAGNOSIS — L814 Other melanin hyperpigmentation: Secondary | ICD-10-CM | POA: Diagnosis not present

## 2024-05-07 DIAGNOSIS — D1801 Hemangioma of skin and subcutaneous tissue: Secondary | ICD-10-CM | POA: Diagnosis not present

## 2024-05-07 DIAGNOSIS — D2371 Other benign neoplasm of skin of right lower limb, including hip: Secondary | ICD-10-CM | POA: Diagnosis not present

## 2024-05-07 DIAGNOSIS — L821 Other seborrheic keratosis: Secondary | ICD-10-CM | POA: Diagnosis not present

## 2024-05-08 ENCOUNTER — Other Ambulatory Visit: Payer: Self-pay | Admitting: Family Medicine

## 2024-05-10 ENCOUNTER — Other Ambulatory Visit: Payer: Self-pay | Admitting: Family Medicine

## 2024-05-10 DIAGNOSIS — Z1231 Encounter for screening mammogram for malignant neoplasm of breast: Secondary | ICD-10-CM

## 2024-05-11 ENCOUNTER — Ambulatory Visit
Admission: RE | Admit: 2024-05-11 | Discharge: 2024-05-11 | Disposition: A | Discharge status: Home / Self Care | Source: Ambulatory Visit

## 2024-05-11 DIAGNOSIS — Z1231 Encounter for screening mammogram for malignant neoplasm of breast: Secondary | ICD-10-CM | POA: Diagnosis not present

## 2024-05-15 ENCOUNTER — Ambulatory Visit (INDEPENDENT_AMBULATORY_CARE_PROVIDER_SITE_OTHER): Admitting: Licensed Clinical Social Worker

## 2024-05-15 DIAGNOSIS — F411 Generalized anxiety disorder: Secondary | ICD-10-CM

## 2024-05-15 NOTE — Progress Notes (Signed)
 [facility] Counselor/Therapist Progress Note  Patient ID: Bergen Melle, MRN: [id number]    Date: 05/15/24  Time Spent: 2:03  pm - 3:06 pm : 63 Minutes : Treatment Type: Individual Therapy.  Reported Symptoms: reported feeling better about accepting what is in her control but aware she has been ruminating and fixated on her sons well being, preparing to watch his children the next week or so while his wife has surgery, preparing mentally for this   Mental Status Exam: Appearance:  Well Groomed     Behavior: Appropriate  Motor: Normal  Speech/Language:  Normal Rate  Affect: Appropriate  Mood: normal  Thought process: normal  Thought content:   WNL  Sensory/Perceptual disturbances:   WNL  Orientation: oriented to person, place, and time/date  Attention: Good  Concentration: Good  Memory: WNL  Fund of knowledge:  Good  Insight:   Good  Judgment:  Good  Impulse Control: Good   Risk Assessment: Danger to Self:  No Self-injurious Behavior: No Danger to Others: No Duty to Warn:no Physical Aggression / Violence:No  Access to Firearms a concern: No  Gang Involvement:No   Subjective:   Loyola Rummage participated from home, via video, and consented to treatment. I discussed the limitations of evaluation and management by telemedicine and the availability of in person appointments. The patient expressed understanding and agreed to proceed.  Therapist participated from home office.  Sam reviewed the events of the past week.      Interventions: Assertiveness/Communication and Solution-Oriented/Positive Psychology  Diagnosis:  GAD  Psychiatric Treatment: No , N/A  Treatment Plan:  Client Abilities/Strengths Pairlee is open and receptive to sessions.  Support System: Friends and Tour manager of Needs Lyndsy would like to reduce and manage her negative thinking and patterns.     Treatment  Level Biweekly  Symptoms  Anxious   (Status: maintained) Rumination/Doom thinking   (Status: maintained)  Goals:   Rabecca experiences symptoms of feeling better since setting the goal.  Feels like she is more aware of her feelings about her son in particular.  Patient has reported doing better in mitigating reactive depression symptoms.  Clear GAD about all life stressors and more increased concern about her adult children's well being with her son being the main priority.  Initially wanted new or increased anxiety medication but shared provider suggested talk therapy to help.  She is still considering medication and ref to PCP to discuss her symptoms as it pertains to usage and efficacy of current meds to either change or increase now that she is coupling with talk therapy.  New goal will be set when she returns from her visit with her son's family.       Target Date: 05/15/24 Frequency: Biweekly  Progress:75% Modality: individual    Therapist will provide referrals for additional resources as appropriate.  Therapist will provide psycho-education regarding Kalliopi's diagnosis and corresponding treatment approaches and interventions. Licensed Clinical Mental Health Counselor, Grandville Lax, [facility] will support the patient's ability to achieve the goals identified. will employ CBT, BA, Problem-solving, Solution Focused, Mindfulness,  coping skills, & other evidenced-based practices will be used to promote progress towards healthy functioning to help manage decrease symptoms associated with her diagnosis.   Reduce overall level, frequency, and intensity of the feelings of depression, anxiety and panic evidenced by decreased rumination and negative thinking.   Verbally express understanding of the relationship between feelings of depression, anxiety and their impact on  thinking patterns and behaviors. Verbalize an understanding of the role that distorted thinking plays in creating fears, excessive  worry, and ruminations.  Mariah Shines participated in the creation of the treatment plan)   Matison Nuccio, LCMHC

## 2024-05-30 ENCOUNTER — Ambulatory Visit (INDEPENDENT_AMBULATORY_CARE_PROVIDER_SITE_OTHER): Admitting: Licensed Clinical Social Worker

## 2024-05-30 DIAGNOSIS — F411 Generalized anxiety disorder: Secondary | ICD-10-CM | POA: Diagnosis not present

## 2024-05-30 NOTE — Progress Notes (Signed)
  [facility] Counselor/Therapist Progress Note  Patient ID: Sharon Medina, MRN: [id number]    Date: 05/30/24  Time Spent: 2:00  pm - 3:00 pm : 60 Minutes  Treatment Type: Individual Therapy.  Reported Symptoms: absorbed in son's affairs still causing extreme anxiety and judgmental patterns  Mental Status Exam: Appearance:  Well Groomed     Behavior: Sharing and Rationalizing  Motor: Normal  Speech/Language:  Normal Rate  Affect: Appropriate  Mood: normal  Thought process: normal  Thought content:   WNL  Sensory/Perceptual disturbances:   WNL  Orientation: oriented to person, place, and time/date  Attention: Good  Concentration: Good  Memory: WNL  Fund of knowledge:  Good  Insight:   Good  Judgment:  Good  Impulse Control: Good   Risk Assessment: Danger to Self:  No Self-injurious Behavior: No Danger to Others: No Duty to Warn:no Physical Aggression / Violence:No  Access to Firearms a concern: No  Gang Involvement:No   Subjective:   Loyola Rummage participated from home, via video, and consented to treatment. I discussed the limitations of evaluation and management by telemedicine and the availability of in person appointments. The patient expressed understanding and agreed to proceed.  Therapist participated from home office.  [name] reviewed the events of the past week.      Interventions: Cognitive Behavioral Therapy and Solution-Oriented/Positive Psychology  Diagnosis:  Generalized anxiety disorder  Psychiatric Treatment: No , N/A  Treatment Plan:  Client Abilities/Strengths Lisbeth is open to sessions.  Support System: Family and Friends   Merchant navy officer of Needs Joley would like to  to reduce and manage her negative thinking and patterns.    Treatment Level Biweekly  Symptoms  Anxious   (Status: maintained) Hopeful   (Status: maintained)  Goals:   Hadar experiences symptoms  of feeling a lack of support and poor communication with her spouse.  He irritates her and she feels as though her voice is not respected.  Discussed options to consider marital counseling.  Reached a plateau in her ability to move forward due to her generalized anxiety.  Will discuss intent, plans and best course of action in next session.  Utilize journal to process triggers.     Target Date: 05/30/24 Frequency: Biweekly  Progress: 0 Modality: individual    Therapist will provide referrals for additional resources as appropriate.  Therapist will provide psycho-education regarding Raylyn's diagnosis and corresponding treatment approaches and interventions. Licensed Clinical Mental Health Counselor, Grandville Lax, [facility] will support the patient's ability to achieve the goals identified. will employ CBT, BA, Problem-solving, Solution Focused, Mindfulness,  coping skills, & other evidenced-based practices will be used to promote progress towards healthy functioning to help manage decrease symptoms associated with her diagnosis.   Reduce overall level, frequency, and intensity of the feelings of depression, anxiety and panic evidenced by decreased fixation and judgement of others and self.   Verbally express understanding of the relationship between feelings of depression, anxiety and their impact on thinking patterns and behaviors. Verbalize an understanding of the role that distorted thinking plays in creating fears, excessive worry, and ruminations.  Mariah Shines participated in the creation of the treatment plan)   Kery Batzel, LCMHC

## 2024-06-13 ENCOUNTER — Encounter: Payer: Self-pay | Admitting: Licensed Clinical Social Worker

## 2024-06-13 ENCOUNTER — Ambulatory Visit (INDEPENDENT_AMBULATORY_CARE_PROVIDER_SITE_OTHER): Admitting: Licensed Clinical Social Worker

## 2024-06-13 DIAGNOSIS — F411 Generalized anxiety disorder: Secondary | ICD-10-CM | POA: Diagnosis not present

## 2024-06-13 NOTE — Progress Notes (Signed)
 [facility] Counselor/Therapist Progress Note  Patient ID: Sharon Medina, MRN: [id number]    Date: 06/13/24  Time Spent: 2:02  pm - 2:58 pm : 56 Minutes  Treatment Type: Individual Therapy.  Reported Symptoms: overall feeling well, returned from vacation with a fresh perspective, still managing anxiety but feeling less frustration with what she cannot control in others  Mental Status Exam: Appearance:  Well Groomed     Behavior: Sharing  Motor: Normal  Speech/Language:  Normal Rate  Affect: Appropriate  Mood: anxious  Thought process: goal directed  Thought content:   WNL  Sensory/Perceptual disturbances:   WNL  Orientation: oriented to person, place, and time/date  Attention: Good  Concentration: Good  Memory: WNL  Fund of knowledge:  Good  Insight:   Good  Judgment:  Good  Impulse Control: Good   Risk Assessment: Danger to Self:  No Self-injurious Behavior: No Danger to Others: No Duty to Warn:no Physical Aggression / Violence:No  Access to Firearms a concern: No  Gang Involvement:No   Subjective:   Sharon Medina participated from home, via video, and consented to treatment. I discussed the limitations of evaluation and management by telemedicine and the availability of in person appointments. The patient expressed understanding and agreed to proceed.  Therapist participated from home office.  Sharon Medina reviewed the events of the past week.      Interventions: Cognitive Behavioral Therapy and Insight-Oriented  Diagnosis:  Generalized anxiety disorder  Psychiatric Treatment: No , N/A  Treatment Plan:  Client Abilities/Strengths Sharon Medina is open to sessions.    Support System: Family and Friends  Merchant navy officer of Needs Sharon Medina would like to to reduce and manage her negative thinking and patterns.    Treatment Level Biweekly  Symptoms  Dwelling   (Status: maintained) Anxious   (Status:  maintained)  Goals:   Sharon Medina experiences symptoms of ruminating on negative thoughts and fears of things out of her control.  New goal set to focus on learning healthy self soothing, distractor's, and self care techniques to reduce rumination and manage anxiety.  Patient agreed and new form will be sent via MyChart.    Target Date: 08/08/24 Frequency: Biweekly  Progress: 0 Modality: individual    Therapist will provide referrals for additional resources as appropriate.  Therapist will provide psycho-education regarding Sharon Medina's diagnosis and corresponding treatment approaches and interventions. Licensed Clinical Mental Health Counselor, Sharon Medina, [facility] will support the patient's ability to achieve the goals identified. will employ CBT, BA, Problem-solving, Solution Focused, Mindfulness,  coping skills, & other evidenced-based practices will be used to promote progress towards healthy functioning to help manage decrease symptoms associated with her diagnosis.   Reduce overall level, frequency, and intensity of the feelings of depression, anxiety and panic evidenced by decreased rumination and fear of negative outcomes from things that are out of her control.   Verbally express understanding of the relationship between feelings of depression, anxiety and their impact on thinking patterns and behaviors. Verbalize an understanding of the role that distorted thinking plays in creating fears, excessive worry, and ruminations.  Sharon Medina participated in the creation of the treatment plan)   Sharon Medina, LCMHC

## 2024-06-27 ENCOUNTER — Ambulatory Visit (INDEPENDENT_AMBULATORY_CARE_PROVIDER_SITE_OTHER): Admitting: Licensed Clinical Social Worker

## 2024-06-27 DIAGNOSIS — F411 Generalized anxiety disorder: Secondary | ICD-10-CM

## 2024-06-27 NOTE — Progress Notes (Signed)
  [facility] Counselor/Therapist Progress Note  Patient ID: Sharon Medina, MRN: [id number]    Date: 06/27/24  Time Spent: 3:03  pm - 3:59 pm : 56 Minutes  Treatment Type: Individual Therapy.  Reported Symptoms: realizes that when she gets low she reminds herself of her goals to motivate her to accomplish, increasing in relaxation techniques  Mental Status Exam: Appearance:  Well Groomed     Behavior: Sharing  Motor: Normal  Speech/Language:  Normal Rate  Affect: Appropriate  Mood: normal  Thought process: normal  Thought content:   WNL  Sensory/Perceptual disturbances:   WNL  Orientation: oriented to person, place, and time/date  Attention: Good  Concentration: Good  Memory: WNL  Fund of knowledge:  Good  Insight:   Good  Judgment:  Good  Impulse Control: Good   Risk Assessment: Danger to Self:  No Self-injurious Behavior: No Danger to Others: No Duty to Warn:no Physical Aggression / Violence:No  Access to Firearms a concern: No  Gang Involvement:No   Subjective:   Sharon Medina participated from home, via video, and consented to treatment. I discussed the limitations of evaluation and management by telemedicine and the availability of in person appointments. The patient expressed understanding and agreed to proceed.  Therapist participated from home office.  Medha reviewed the events of the past week.      Interventions: Cognitive Behavioral Therapy and Solution-Oriented/Positive Psychology  Diagnosis:  Generalized anxiety disorder  Psychiatric Treatment: No , N/A  Treatment Plan:  Client Abilities/Strengths Sharon Medina is open to sessions.    Support System: Family and Friends  Merchant [employer] officer of Needs Sharon Medina would like to  reduce and manage her negative thinking and patterns.     Treatment Level Biweekly  Symptoms  Supported   (Status: improved) Prioritizing   (Status:  improved)  Goals:   Sharon Medina experiences symptoms of shutting down when overstimulated and avoids verbal back and forth.  Wants to be able to change and speak her mind without creating more conflict in explaining or creating discord. I am hopeful for progress because I am more self aware.  I prioritize myself because I know I deserve it.     Target Date: 08/08/24 Frequency: Biweekly  Progress: 0 Modality: individual    Therapist will provide referrals for additional resources as appropriate.  Therapist will provide psycho-education regarding Sharon Medina's diagnosis and corresponding treatment approaches and interventions. Licensed Clinical Mental Health Counselor, Tawni Louder, [facility] will support the patient's ability to achieve the goals identified. will employ CBT, BA, Problem-solving, Solution Focused, Mindfulness,  coping skills, & other evidenced-based practices will be used to promote progress towards healthy functioning to help manage decrease symptoms associated with her diagnosis.   Reduce overall level, frequency, and intensity of the feelings of depression, anxiety and panic evidenced by decreased moments of being overstimulated and snippy at people she cares about.   Verbally express understanding of the relationship between feelings of depression, anxiety and their impact on thinking patterns and behaviors. Verbalize an understanding of the role that distorted thinking plays in creating fears, excessive worry, and ruminations.  Sharon Medina participated in the creation of the treatment plan)   [name], LCMHC

## 2024-07-05 ENCOUNTER — Ambulatory Visit: Payer: Medicare PPO

## 2024-07-05 VITALS — Ht 66.0 in | Wt 138.0 lb

## 2024-07-05 DIAGNOSIS — Z0000 Encounter for general adult medical examination without abnormal findings: Secondary | ICD-10-CM | POA: Diagnosis not present

## 2024-07-05 NOTE — Progress Notes (Signed)
 Subjective:   Sharon Medina is a 68 y.o. who presents for a Medicare Wellness preventive visit.  As a reminder, Annual Wellness Visits don't include a physical exam, and some assessments may be limited, especially if this visit is performed virtually. We may recommend an in-person follow-up visit with your provider if needed.  Visit Complete: Virtual I connected with  Sharon Medina on 07/05/24 by a audio enabled telemedicine application and verified that I am speaking with the correct person using two identifiers.  Patient Location: Home  Provider Location: Office/Clinic  I discussed the limitations of evaluation and management by telemedicine. The patient expressed understanding and agreed to proceed.  Vital Signs: Because this visit was a virtual/telehealth visit, some criteria may be missing or patient reported. Any vitals not documented were not able to be obtained and vitals that have been documented are patient reported.  VideoDeclined- This patient declined Librarian, academic. Therefore the visit was completed with audio only.  Persons Participating in Visit: Patient.  AWV Questionnaire: Yes: Patient Medicare AWV questionnaire was completed by the patient on 07/01/24; I have confirmed that all information answered by patient is correct and no changes since this date.  Cardiac Risk Factors include: advanced age (>54men, >17 women);dyslipidemia;hypertension     Objective:    Today's Vitals   07/05/24 1006  Weight: 138 lb (62.6 kg)  Height: 5' 6 (1.676 m)   Body mass index is 22.27 kg/m.     07/05/2024   10:09 AM 06/30/2023    9:08 AM 05/20/2022    2:04 PM 07/20/2016    8:06 AM  Advanced Directives  Does Patient Have a Medical Advance Directive? No Yes Yes No   Type of Special educational needs teacher of [place];Living will Healthcare Power of Attorney   Copy of Healthcare Power of Attorney in Chart?  No - copy requested No -  copy requested   Would patient like information on creating a medical advance directive? No - Patient declined        Data saved with a previous flowsheet row definition    Current Medications (verified) Outpatient Encounter Medications as of 07/05/2024  Medication Sig   amLODipine -valsartan  (EXFORGE ) 5-320 MG tablet TAKE 1 TABLET BY MOUTH DAILY   atenolol  (TENORMIN ) 50 MG tablet TAKE 1 TABLET(50 MG) BY MOUTH DAILY   buPROPion  (WELLBUTRIN  XL) 150 MG 24 hr tablet TAKE 2 TABLETS BY MOUTH EVERY DAY   Calcium Carbonate-Vitamin D  (CALCIUM-D PO) Take by mouth.   Coenzyme Q10 (CO Q 10) 10 MG CAPS Take 1 tablet by mouth daily.   Estradiol  10 MCG TABS vaginal tablet INSERT 1 TABLET VAGINALLY 2 TIMES A WEEK   fluticasone  (FLONASE ) 50 MCG/ACT nasal spray SHAKE LIQUID AND USE 2 SPRAYS IN EACH NOSTRIL DAILY   fluticasone  (FLONASE ) 50 MCG/ACT nasal spray Place 2 sprays into both nostrils daily.   glucosamine-chondroitin 500-400 MG tablet Take 1 tablet by mouth 2 (two) times daily as needed.   Menaquinone-7 (VITAMIN K2 PO) Take by mouth daily.   sertraline  (ZOLOFT ) 50 MG tablet TAKE 1 TABLET BY MOUTH DAILY   [DISCONTINUED] predniSONE (STERAPRED UNI-PAK 48 TAB) 5 MG (48) TBPK tablet Take 5 mg by mouth as directed.   No facility-administered encounter medications on file as of 07/05/2024.    Allergies (verified) Hydrochlorothiazide   History: Past Medical History:  Diagnosis Date   Allergy    SEASONAL   Anxiety    Arthritis    HANDS,FOOT -LEFT  BIG TOE   Cataract    BILATERAL,SMALL   Claustrophobia    severe   Depression    GERD (gastroesophageal reflux disease)    H/O seasonal allergies    Hemorrhoids    Hypertension    Osteopenia    Postmenopausal HRT (hormone replacement therapy)    per pt, never took oral HR meds   Past Surgical History:  Procedure Laterality Date   CESAREAN SECTION     x2   COLONOSCOPY     POLYPECTOMY     WISDOM TOOTH EXTRACTION     Family History  Problem  Relation Age of Onset   Hypertension Mother        sister as well   Prostate cancer Father        smoker. died of this   Hypertension Father    Ulcers Father    Colon polyps Sister    Gout Sister    Hypertension Sister    Arthritis Sister    Obesity Sister    Gout Brother    Heart disease Brother    Prostate cancer Brother    Depression Maternal Grandmother    Cancer Paternal Grandmother    Anxiety disorder Daughter    ADD / ADHD Son    Anxiety disorder Son    Learning disabilities Son    Colon cancer Paternal Aunt    Colon cancer Paternal Aunt    Cancer Paternal Aunt    Cancer Paternal Aunt    Cancer Paternal Aunt    Crohn's disease Neg Hx    Esophageal cancer Neg Hx    Rectal cancer Neg Hx    Stomach cancer Neg Hx    Ulcerative colitis Neg Hx    Social History   Socioeconomic History   Marital status: Married    Spouse name: Not on file   Number of children: Not on file   Years of education: Not on file   Highest education level: Bachelor's degree (e.g., BA, AB, BS)  Occupational History   Not on file  Tobacco Use   Smoking status: Never    Passive exposure: Past (BOTH PARENTS SMOKED)   Smokeless tobacco: Never  Vaping Use   Vaping status: Never Used  Substance and Sexual Activity   Alcohol use: Yes    Alcohol/week: 7.0 standard drinks of alcohol    Types: 4 Glasses of wine, 3 Shots of liquor per week    Comment: WINE 2-3 X A WEEK   Drug use: Never   Sexual activity: Yes    Birth control/protection: Post-menopausal    Comment: 1st intercourse 68 yo-Fewer than 5 partners  Other Topics Concern   Not on file  Social History Narrative   Married. 2 living children, 1 died at birth. 4 grandchildren, 1 step grandchild.    Lives with husband.       Retired 2019   Former teacher-retired.    Prior with [employer]- caps and gowns etc. Stressful boss- no woff      Hobbies: rummy cube (tile game), antiquing, cooking, gardening   Social Drivers of Health    Financial Resource Strain: Low Risk  (07/01/2024)   Overall Financial Resource Strain (CARDIA)    Difficulty of Paying Living Expenses: Not very hard  Food Insecurity: No Food Insecurity (07/01/2024)   Hunger Vital Sign    Worried About Running Out of Food in the Last Year: Never true    Ran Out of Food in the Last Year: Never true  Transportation Needs: No Transportation Needs (07/01/2024)   PRAPARE - Administrator, Civil Service (Medical): No    Lack of Transportation (Non-Medical): No  Physical Activity: Insufficiently Active (07/01/2024)   Exercise Vital Sign    Days of Exercise per Week: 2 days    Minutes of Exercise per Session: 60 min  Stress: Stress Concern Present (07/01/2024)   [employer] of Occupational Health - Occupational Stress Questionnaire    Feeling of Stress: Rather much  Social Connections: Moderately Isolated (07/01/2024)   Social Connection and Isolation Panel    Frequency of Communication with Friends and Family: More than three times a week    Frequency of Social Gatherings with Friends and Family: Twice a week    Attends Religious Services: Patient declined    Database administrator or Organizations: No    Attends Engineer, structural: Not on file    Marital Status: Married    Tobacco Counseling Counseling given: Not Answered    Clinical Intake:  Pre-visit preparation completed: Yes  Pain : No/denies pain     BMI - recorded: 22.27 Nutritional Status: BMI of 19-24  Normal Nutritional Risks: None Diabetes: No  No results found for: HGBA1C   How often do you need to have someone help you when you read instructions, pamphlets, or other written materials from your doctor or pharmacy?: 1 - Never  Interpreter Needed?: No  Information entered by :: Sharon Haws, LPN   Activities of Daily Living     07/01/2024    9:08 AM  In your present state of health, do you have any difficulty performing the following  activities:  Hearing? 0  Vision? 0  Difficulty concentrating or making decisions? 1  Comment at times  Walking or climbing stairs? 0  Dressing or bathing? 0  Doing errands, shopping? 0  Preparing Food and eating ? N  Using the Toilet? N  In the past six months, have you accidently leaked urine? N  Do you have problems with loss of bowel control? N  Managing your Medications? N  Managing your Finances? N  Housekeeping or managing your Housekeeping? N    Patient Care Team: Katrinka Garnette KIDD, MD as PCP - General (Family Medicine)  I have updated your Care Teams any recent Medical Services you may have received from other providers in the past year.     Assessment:   This is a routine wellness examination for Sharon Medina.  Hearing/Vision screen Hearing Screening - Comments:: Pt denies any hearing issues  Vision Screening - Comments:: Wears rx glasses - up to date with routine eye exams with  Dr Debarah    Goals Addressed             This Visit's Progress    Patient Stated       Continue working in the gym        Depression Screen     07/05/2024   10:11 AM 02/07/2024    8:38 AM 06/30/2023    9:06 AM 06/07/2023   11:06 AM 12/30/2022    9:41 AM 06/25/2022    8:55 AM 05/20/2022    2:03 PM  PHQ 2/9 Scores  PHQ - 2 Score 3 6 0 0 2 4 0  PHQ- 9 Score 11 10 0 0 3 5     Fall Risk     07/01/2024    9:08 AM 06/26/2023    9:57 AM 06/07/2023   11:06 AM 04/05/2023  8:50 AM 05/20/2022    2:05 PM  Fall Risk   Falls in the past year? 1 0 0 0 0  Number falls in past yr: 0 0 0 0 0  Injury with Fall? 1  0 0 0  Comment elbow from walking the trail      Risk for fall due to : History of fall(s) Impaired vision No Fall Risks No Fall Risks Impaired vision  Follow up Falls prevention discussed Falls prevention discussed Falls evaluation completed Falls evaluation completed Falls prevention discussed      Data saved with a previous flowsheet row definition    MEDICARE RISK AT HOME:  Medicare  Risk at Home If so, are there any without handrails?: (Patient-Rptd) No Home free of loose throw rugs in walkways, pet beds, electrical cords, etc?: (Patient-Rptd) No Adequate lighting in your home to reduce risk of falls?: (Patient-Rptd) Yes Life alert?: (Patient-Rptd) No Use of a cane, walker or w/c?: (Patient-Rptd) No Grab bars in the bathroom?: (Patient-Rptd) Yes Shower chair or bench in shower?: (Patient-Rptd) No Elevated toilet seat or a handicapped toilet?: (Patient-Rptd) Yes  TIMED UP AND GO:  Was the test performed?  No  Cognitive Function: 6CIT completed        07/05/2024   10:17 AM 06/30/2023    9:09 AM 05/20/2022    2:06 PM  6CIT Screen  What Year? 0 points 0 points 0 points  What month? 0 points 0 points 0 points  What time? 0 points 0 points 0 points  Count back from 20 0 points 0 points 0 points  Months in reverse 0 points 0 points 0 points  Repeat phrase 0 points 0 points 0 points  Total Score 0 points 0 points 0 points    Immunizations Immunization History  Administered Date(s) Administered   Fluad Quad(high Dose 65+) 10/06/2020   Fluad Trivalent(High Dose 65+) 10/18/2023   Influenza Split 09/10/2011, 09/18/2012   Influenza Whole 09/07/2010   Influenza, High Dose Seasonal PF 10/03/2022   Influenza,inj,Quad PF,6+ Mos 09/24/2013, 09/25/2014, 10/16/2015, 09/17/2016, 08/17/2017, 11/06/2018, 08/22/2019, 10/02/2021   Influenza,inj,quad, With Preservative 11/06/2018   Influenza-Unspecified 08/17/2017, 08/22/2019   Moderna Covid-19 Fall Seasonal Vaccine 74yrs & older 03/29/2023, 10/10/2023   Moderna Covid-19 Vaccine Bivalent Booster 11yrs & up 07/16/2021, 10/02/2021   Moderna SARS-COV2 Booster Vaccination 06/25/2022, 11/30/2022   Moderna Sars-Covid-2 Vaccination 02/24/2020, 03/23/2020, 10/16/2020   PNEUMOCOCCAL CONJUGATE-20 06/11/2021   PPD Test 04/10/2012   Respiratory Syncytial Virus Vaccine,Recomb Aduvanted(Arexvy) 09/16/2022   Td 12/20/2000   Tdap  09/18/2012, 07/02/2024   Zoster Recombinant(Shingrix) 12/08/2020, 04/09/2021    Screening Tests Health Maintenance  Topic Date Due   COVID-19 Vaccine (10 - 2024-25 season) 04/09/2024   INFLUENZA VACCINE  07/20/2024   Medicare Annual Wellness (AWV)  07/05/2025   Colonoscopy  09/13/2025   DEXA SCAN  04/04/2026   MAMMOGRAM  05/11/2026   Pneumococcal Vaccine: 50+ Years  Completed   Hepatitis C Screening  Completed   Zoster Vaccines- Shingrix  Completed   Hepatitis B Vaccines  Aged Out   HPV VACCINES  Aged Out   Meningococcal B Vaccine  Aged Out   DTaP/Tdap/Td  Discontinued    Health Maintenance  Health Maintenance Due  Topic Date Due   COVID-19 Vaccine (10 - 2024-25 season) 04/09/2024   Health Maintenance Items Addressed: See Nurse Notes at the end of this note  Additional Screening:  Vision Screening: Recommended annual ophthalmology exams for early detection of glaucoma and other disorders of the eye.  Would you like a referral to an eye doctor? No    Dental Screening: Recommended annual dental exams for proper oral hygiene  Community Resource Referral / Chronic Care Management: CRR required this visit?  No   CCM required this visit?  No   Plan:    I have personally reviewed and noted the following in the patient's chart:   Medical and social history Use of alcohol, tobacco or illicit drugs  Current medications and supplements including opioid prescriptions. Patient is not currently taking opioid prescriptions. Functional ability and status Nutritional status Physical activity Advanced directives List of other physicians Hospitalizations, surgeries, and ER visits in previous 12 months Vitals Screenings to include cognitive, depression, and falls Referrals and appointments  In addition, I have reviewed and discussed with patient certain preventive protocols, quality metrics, and best practice recommendations. A written personalized care plan for preventive  services as well as general preventive health recommendations were provided to patient.   Sharon VEAR Haws, LPN   2/82/7974   After Visit Summary: (MyChart) Due to this being a telephonic visit, the after visit summary with patients personalized plan was offered to patient via MyChart   Notes: Nothing significant to report at this time.

## 2024-07-05 NOTE — Patient Instructions (Signed)
 Ms. Sharon Medina , Thank you for taking time out of your busy schedule to complete your Annual Wellness Visit with me. I enjoyed our conversation and look forward to speaking with you again next year. I, as well as your care team,  appreciate your ongoing commitment to your health goals. Please review the following plan we discussed and let me know if I can assist you in the future. Your Game plan/ To Do List    Referrals: If you haven't heard from the office you've been referred to, please reach out to them at the phone provided.   Follow up Visits: Next Medicare AWV with our clinical staff: 07/10/25   Have you seen your provider in the last 6 months (3 months if uncontrolled diabetes)? Yes Next Office Visit with your provider: 08/17/24  Clinician Recommendations:  Aim for 30 minutes of exercise or brisk walking, 6-8 glasses of water, and 5 servings of fruits and vegetables each day.       This is a list of the screening recommended for you and due dates:  Health Maintenance  Topic Date Due   COVID-19 Vaccine (10 - 2024-25 season) 04/09/2024   Medicare Annual Wellness Visit  06/29/2024   Flu Shot  07/20/2024   Colon Cancer Screening  09/13/2025   DEXA scan (bone density measurement)  04/04/2026   Mammogram  05/11/2026   Pneumococcal Vaccine for age over 69  Completed   Hepatitis C Screening  Completed   Zoster (Shingles) Vaccine  Completed   Hepatitis B Vaccine  Aged Out   HPV Vaccine  Aged Out   Meningitis B Vaccine  Aged Out   DTaP/Tdap/Td vaccine  Discontinued    Advanced directives: (Declined) Advance directive discussed with you today. Even though you declined this today, please call our office should you change your mind, and we can give you the proper paperwork for you to fill out. Advance Care Planning is important because it:  [x]  Makes sure you receive the medical care that is consistent with your values, goals, and preferences  [x]  It provides guidance to your family and  loved ones and reduces their decisional burden about whether or not they are making the right decisions based on your wishes.  Follow the link provided in your after visit summary or read over the paperwork we have mailed to you to help you started getting your Advance Directives in place. If you need assistance in completing these, please reach out to us  so that we can help you!  See attachments for Preventive Care and Fall Prevention Tips.

## 2024-07-09 ENCOUNTER — Other Ambulatory Visit: Payer: Self-pay

## 2024-07-09 DIAGNOSIS — N952 Postmenopausal atrophic vaginitis: Secondary | ICD-10-CM

## 2024-07-09 NOTE — Telephone Encounter (Signed)
.  Med refill request: Estradiol  10 MCG vaginal tabs  Last AEX: 02/16/24 Next AEX: 02/19/25 Last MMG (if hormonal med) 05/16/24 BI-Rads 1 Neg.  Refill authorized: Please Advise?

## 2024-07-10 ENCOUNTER — Other Ambulatory Visit: Payer: Self-pay

## 2024-07-10 MED ORDER — ESTRADIOL 10 MCG VA TABS: ORAL_TABLET | VAGINAL | 2 refills | Status: AC

## 2024-07-12 ENCOUNTER — Ambulatory Visit: Admitting: Licensed Clinical Social Worker

## 2024-07-12 DIAGNOSIS — F411 Generalized anxiety disorder: Secondary | ICD-10-CM | POA: Diagnosis not present

## 2024-07-12 NOTE — Progress Notes (Signed)
 [facility] Counselor/Therapist Progress Note  Patient ID: Sharon Medina, MRN: [id number]    Date: 07/12/24  Time Spent: 9:00  am - 9:59 am : 59 Minutes  Treatment Type: Individual Therapy.  Reported Symptoms:   Mental Status Exam: Appearance:  Well Groomed     Behavior: Sharing  Motor: Normal  Speech/Language:  Normal Rate  Affect: Appropriate  Mood: normal  Thought process: normal  Thought content:   WNL  Sensory/Perceptual disturbances:   WNL  Orientation: oriented to person, place, and time/date  Attention: Good  Concentration: Good  Memory: WNL  Fund of knowledge:  Good  Insight:   Good  Judgment:  Good  Impulse Control: Good   Risk Assessment: Danger to Self:  No Self-injurious Behavior: No Danger to Others: No Duty to Warn:no Physical Aggression / Violence:No  Access to Firearms a concern: No  Gang Involvement:No   Subjective:   Darice Roslynn Donath participated from home, via video, and consented to treatment. I discussed the limitations of evaluation and management by telemedicine and the availability of in person appointments. The patient expressed understanding and agreed to proceed.  Therapist participated from home office.  Devita reviewed the events of the past week.      Interventions: Cognitive Behavioral Therapy and Mindfulness Meditation  Diagnosis:  Generalized anxiety disorder  Psychiatric Treatment: No , N/A  Treatment Plan:  Client Abilities/Strengths Ashli is open to sessions.    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      Treatment Level Biweekly  Symptoms  Frustrated   (Status: maintained) Supportive   (Status: improved)  Goals:   Andreika reported that watching the news evokes fear and creates anger at the world events.  Also shared her negative views on technology  advancements and her levels of skepticism with scamming and authentic communications.  Disturbances in change caused discomfort for her.     Target Date: 08/08/24 Frequency: Biweekly  Progress: 0 Modality: individual    Therapist will provide referrals for additional resources as appropriate.  Therapist will provide psycho-education regarding Rohini's diagnosis and corresponding treatment approaches and interventions. Licensed Clinical Mental Health Counselor, Tawni Louder, [facility] will support the patient's ability to achieve the goals identified. will employ CBT, BA, Problem-solving, Solution Focused, Mindfulness,  coping skills, & other evidenced-based practices will be used to promote progress towards healthy functioning to help manage decrease symptoms associated with her diagnosis.   Reduce overall level, frequency, and intensity of the feelings of depression, anxiety and panic evidenced by decreased negative talk and thinking and increasing supportive reatslionships.   Verbally express understanding of the relationship between feelings of depression, anxiety and their impact on thinking patterns and behaviors. Verbalize an understanding of the role that distorted thinking plays in creating fears, excessive worry, and ruminations.  [name] participated in the creation of the treatment plan)   Miriah Maruyama, LCMHC

## 2024-07-13 NOTE — Telephone Encounter (Signed)
 Opened in error

## 2024-07-23 ENCOUNTER — Ambulatory Visit (INDEPENDENT_AMBULATORY_CARE_PROVIDER_SITE_OTHER): Admitting: Licensed Clinical Social Worker

## 2024-07-23 DIAGNOSIS — F411 Generalized anxiety disorder: Secondary | ICD-10-CM

## 2024-07-23 NOTE — Progress Notes (Signed)
 [facility] Counselor/Therapist Progress Note  Patient ID: Hula Tasso, MRN: [id number]    Date: 07/23/24  Time Spent: 9:00  am - 9:53 am : 53 Minutes  Treatment Type: Individual Therapy.  Reported Symptoms: more accepting and doing what she can do to reduce the stress, monitoring baby steps we have been discussing in making progress   Mental Status Exam: Appearance:  Well Groomed     Behavior: Sharing  Motor: Normal  Speech/Language:  Normal Rate  Affect: Appropriate  Mood: normal  Thought process: normal  Thought content:   WNL  Sensory/Perceptual disturbances:   WNL  Orientation: oriented to person, place, and time/date  Attention: Good  Concentration: Good  Memory: WNL  Fund of knowledge:  Good  Insight:   Good  Judgment:  Good  Impulse Control: Good   Risk Assessment: Danger to Self:  No Self-injurious Behavior: No Danger to Others: No Duty to Warn:no Physical Aggression / Violence:No  Access to Firearms a concern: No  Gang Involvement:No   Subjective:   Darice Roslynn Donath participated from home, via video, and consented to treatment. I discussed the limitations of evaluation and management by telemedicine and the availability of in person appointments. The patient expressed understanding and agreed to proceed.  Therapist participated from home office.  Javona reviewed the events of the past week.      Interventions: Cognitive Behavioral Therapy and Solution-Oriented/Positive Psychology Mindfulness  Diagnosis:  Generalized anxiety disorder  Psychiatric Treatment: No , N/A  Treatment Plan:  Client Abilities/Strengths Quita is open to sessions.    Support System: Family and Friends   Merchant navy officer of Needs Rhyli would like to  reduce and manage her negative thinking and patterns.      Treatment Level Biweekly  Symptoms  Feeling Better   (Status: improved) More accepting/No  hiding/More aware   (Status: improved)  Goals:   Latoyia experiences symptoms of using distractor's intentionally and not impulsive.  She is slowing down and has plans and looks for solutions -work scientifically and feels the older she gets the better she is.  She still is working on asking for things she needs/wants.  She does not have expectations and does things without looking for return and no tit for tat behavior.  Recognizes that she was not over indulged and can have a hard time accepting.     Target Date: 08/08/24 Frequency: Biweekly  Progress: 0 Modality: individual    Therapist will provide referrals for additional resources as appropriate.  Therapist will provide psycho-education regarding Keslyn's diagnosis and corresponding treatment approaches and interventions. Licensed Clinical Mental Health Counselor, Tawni Louder, [facility] will support the patient's ability to achieve the goals identified. will employ CBT, BA, Problem-solving, Solution Focused, Mindfulness,  coping skills, & other evidenced-based practices will be used to promote progress towards healthy functioning to help manage decrease symptoms associated with her diagnosis.   Reduce overall level, frequency, and intensity of the feelings of depression, anxiety and panic evidenced by decreased anxious patterns of behavior utilizing healthy distractor's from 6 to 7 days/week to 0 to 1 days/week per client report for at least 3 consecutive months. Verbally express understanding of the relationship between feelings of depression, anxiety and their impact on thinking patterns and behaviors. Verbalize an understanding of the role that distorted thinking plays in creating fears, excessive worry, and ruminations.  [name] participated in the creation of the treatment plan)   Sabir Charters, LCMHC

## 2024-08-06 ENCOUNTER — Encounter: Payer: Self-pay | Admitting: Licensed Clinical Social Worker

## 2024-08-06 ENCOUNTER — Ambulatory Visit (INDEPENDENT_AMBULATORY_CARE_PROVIDER_SITE_OTHER): Admitting: Licensed Clinical Social Worker

## 2024-08-06 DIAGNOSIS — F411 Generalized anxiety disorder: Secondary | ICD-10-CM | POA: Diagnosis not present

## 2024-08-06 NOTE — Progress Notes (Signed)
 [facility] Counselor/Therapist Progress Note  Patient ID: Sharon Medina, MRN: [id number]    Date: 08/06/24  Time Spent: 9:00  am - 9:57 am : 57 Minutes  Treatment Type: Individual Therapy.  Reported Symptoms: relaxing weekend-uneventful, showing less judgement for self   Mental Status Exam: Appearance:  Well Groomed     Behavior: Appropriate and Sharing  Motor: Normal  Speech/Language:  Normal Rate  Affect: Appropriate  Mood: normal  Thought process: normal  Thought content:   WNL  Sensory/Perceptual disturbances:   WNL  Orientation: oriented to person, place, and time/date  Attention: Good  Concentration: Good  Memory: WNL  Fund of knowledge:  Good  Insight:   Good  Judgment:  Good  Impulse Control: Good   Risk Assessment: Danger to Self:  No Self-injurious Behavior: No Danger to Others: No Duty to Warn:no Physical Aggression / Violence:No  Access to Firearms a concern: No  Gang Involvement:No   Subjective:   Darice Roslynn Donath participated from home, via video, and consented to treatment. I discussed the limitations of evaluation and management by telemedicine and the availability of in person appointments. The patient expressed understanding and agreed to proceed.  Therapist participated from home office.  Keamber reviewed the events of the past week.      Interventions: Mindfulness Meditation, Solution-Oriented/Positive Psychology, and Insight-Oriented  Diagnosis:  Generalized anxiety disorder  Psychiatric Treatment: No , N/A  Treatment Plan:  Client Abilities/Strengths Mandalyn is open to sessions.    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         Treatment Level Biweekly  Symptoms  More accepting of self and others    (Status: improved) Feeling better/Hopeful/Optimistic   (Status:  improved)  Goals:   Deauna experiences symptoms of feeling better about her areas of growth of acceptance, speaking her mind to people including family and others.  This is different from how she would avoid battles but now utilizing her voice and her strength.  My silence is always agreement.  She can be rigid about her viewing and tolerance of others but seems set in these patterns but trying to get better.     Target Date: 08/08/24 Frequency: Biweekly  Progress: 0 Modality: individual    Therapist will provide referrals for additional resources as appropriate.  Therapist will provide psycho-education regarding [place]'s diagnosis and corresponding treatment approaches and interventions. Licensed Clinical Mental Health Counselor, Tawni Louder, [facility] will support the patient's ability to achieve the goals identified. will employ CBT, BA, Problem-solving, Solution Focused, Mindfulness,  coping skills, & other evidenced-based practices will be used to promote progress towards healthy functioning to help manage decrease symptoms associated with her diagnosis.   Reduce overall level, frequency, and intensity of the feelings of depression, anxiety and panic evidenced by decreased negative doom thinking which increases anxiety levels.  Increased hopeful and acceptance of things.   Verbally express understanding of the relationship between feelings of depression, anxiety and their impact on thinking patterns and behaviors. Verbalize an understanding of the role that distorted thinking plays in creating fears, excessive worry, and ruminations.  [name] participated in the creation of the treatment plan)   Londan Coplen, LCMHC

## 2024-08-08 ENCOUNTER — Other Ambulatory Visit: Payer: Self-pay | Admitting: Family Medicine

## 2024-08-17 ENCOUNTER — Encounter: Payer: Self-pay | Admitting: Family Medicine

## 2024-08-17 ENCOUNTER — Ambulatory Visit: Payer: Medicare PPO | Admitting: Family Medicine

## 2024-08-17 VITALS — BP 108/78 | HR 59 | Temp 97.6°F | Ht 66.0 in | Wt 141.4 lb

## 2024-08-17 DIAGNOSIS — I10 Essential (primary) hypertension: Secondary | ICD-10-CM

## 2024-08-17 DIAGNOSIS — E785 Hyperlipidemia, unspecified: Secondary | ICD-10-CM

## 2024-08-17 DIAGNOSIS — F3342 Major depressive disorder, recurrent, in full remission: Secondary | ICD-10-CM | POA: Diagnosis not present

## 2024-08-17 DIAGNOSIS — F411 Generalized anxiety disorder: Secondary | ICD-10-CM | POA: Diagnosis not present

## 2024-08-17 NOTE — Patient Instructions (Addendum)
 We will call you within two weeks about your referral to Ct calcium scoring through [facility].  Their phone number is [phone].  Please call them if you have not heard in 1-2 weeks  Glad you are doing so well and making progress in multiple areas!   Recommended follow up: Return in about 6 months (around 02/16/2025) for physical or sooner if needed.Schedule b4 you leave.

## 2024-08-17 NOTE — Progress Notes (Signed)
 Phone [phone] In person visit   Subjective:   Sharon Medina is a 68 y.o. year old very pleasant female patient who presents for/with See problem oriented charting Chief Complaint  Patient presents with   Depression    Pt filling out clipboard phq 9; gad 7   Hyperlipidemia   Hypertension   Past Medical History-  Patient Active Problem List   Diagnosis Date Noted   Hyperlipidemia, unspecified 06/05/2020    Priority: Medium    Osteopenia 12/02/2017    Priority: Medium    GAD (generalized anxiety disorder) 12/02/2017    Priority: Medium    Major depression in full remission (HCC) 08/24/2007    Priority: Medium    Essential hypertension 08/24/2007    Priority: Medium    History of colonic polyps 06/13/2008    Priority: Low   Allergic rhinitis 08/24/2007    Priority: Low   Sensory hearing loss, bilateral 05/01/2024   Other specified disorders of eustachian tube, bilateral 05/01/2024   Chronic rhinitis 05/01/2024   Deviated nasal septum 05/01/2024   Hypertrophy of nasal turbinates 05/01/2024    Medications- reviewed and updated Current Outpatient Medications  Medication Sig Dispense Refill   amLODipine -valsartan  (EXFORGE ) 5-320 MG tablet TAKE 1 TABLET BY MOUTH DAILY 90 tablet 3   atenolol  (TENORMIN ) 50 MG tablet TAKE 1 TABLET(50 MG) BY MOUTH DAILY 90 tablet 2   buPROPion  (WELLBUTRIN  XL) 150 MG 24 hr tablet TAKE 2 TABLETS BY MOUTH EVERY DAY 180 tablet 3   Calcium Carbonate-Vitamin D  (CALCIUM-D PO) Take by mouth.     Coenzyme Q10 (CO Q 10) 10 MG CAPS Take 1 tablet by mouth daily.     Estradiol  10 MCG TABS vaginal tablet INSERT 1 TABLET VAGINALLY 2 TIMES A WEEK 24 tablet 2   fluticasone  (FLONASE ) 50 MCG/ACT nasal spray SHAKE LIQUID AND USE 2 SPRAYS IN EACH NOSTRIL DAILY 48 g 1   glucosamine-chondroitin 500-400 MG tablet Take 1 tablet by mouth 2 (two) times daily as needed.     Menaquinone-7 (VITAMIN K2 PO) Take by mouth daily.     sertraline  (ZOLOFT ) 50 MG tablet  TAKE 1 TABLET BY MOUTH DAILY 90 tablet 3   No current facility-administered medications for this visit.     Objective:  BP 108/78 (BP Location: Left Arm, Patient Position: Sitting, Cuff Size: Normal)   Pulse (!) 59   Temp 97.6 F (36.4 C) (Temporal)   Ht 5' 6 (1.676 m)   Wt 141 lb 6.4 oz (64.1 kg)   LMP 10/02/2014   SpO2 98%   BMI 22.82 kg/m  Gen: NAD, resting comfortably CV: RRR no murmurs rubs or gallops Lungs: CTAB no crackles, wheeze, rhonchi Ext: minimal edema Skin: warm, dry     Assessment and Plan   #hypertension S: medication: Atenolol  50 mg daily, amlodipine -valsartan  5-320 mg daily - was higher at Dr. Rojean visit/ENT but allergies really bothering her and doesn't enjoy scoping A/P:  stable- continue current medicines   #hyperlipidemia S: Medication:None -No first-degree relatives with CAD and ASCVD right- The 10-year ASCVD risk score (Arnett DK, et al., 2019) is: 6.4% Lab Results  Component Value Date   CHOL 190 02/07/2024   HDL 84.50 02/07/2024   LDLCALC 94 02/07/2024   TRIG 56.0 02/07/2024   CHOLHDL 2 02/07/2024  A/P: lipids only mildly elevated. Shed prefer to get CT calcium baseline - ordered today   # Depression-recurrent #GAD S: Medication:Wellbutrin  150 mg extended release-takes 2 tablets daily, sertraline  50 mg  -Has  been doing therapy and therapist recommended tai chi- she is doing it along with husband - has been working on Warden/ranger- has enjoyed that A/P:  doing well/making progress with PHQ9 of 4-5 (half points) and GAD7 of 4 . Very intentional with therapy- distraction of art helpful  Recommended follow up: Return in about 6 months (around 02/16/2025) for physical or sooner if needed.Schedule b4 you leave. Future Appointments  Date Time Provider Department Center  08/21/2024  2:00 PM Vicci Maus, [facility] None  02/19/2025  8:30 AM Prentiss Annabella LABOR, NP [facility] None  07/10/2025 10:40 AM [facility]  VISIT 1 [facility] Milian    Lab/Order associations:   ICD-10-CM   1. Essential hypertension  I10     2. Hyperlipidemia, unspecified hyperlipidemia type  E78.5 CT CARDIAC SCORING ([facility] LOCATIONS ONLY)    3. Recurrent major depressive disorder, in full remission (HCC)  F33.42     4. GAD (generalized anxiety disorder)  F41.1       No orders of the defined types were placed in this encounter.   Return precautions advised.  Garnette Lukes, MD

## 2024-08-21 ENCOUNTER — Ambulatory Visit (INDEPENDENT_AMBULATORY_CARE_PROVIDER_SITE_OTHER): Admitting: Licensed Clinical Social Worker

## 2024-08-21 DIAGNOSIS — F411 Generalized anxiety disorder: Secondary | ICD-10-CM

## 2024-08-21 NOTE — Progress Notes (Signed)
 [facility] Counselor/Therapist Progress Note  Patient ID: Sharon Medina, MRN: [id number]    Date: 08/21/24  Time Spent: 2:03  pm - 3:00 pm : 57 Minutes  Treatment Type: Individual Therapy.  Reported Symptoms: overall mood improved and more acceptance of what she can control has given her a sense of power and control along with peace of mind that she can make tough decisions without malicious intent  Mental Status Exam: Appearance:  Well Groomed     Behavior: Appropriate and Sharing  Motor: Normal  Speech/Language:  Normal Rate  Affect: Appropriate  Mood: normal  Thought process: normal  Thought content:   WNL  Sensory/Perceptual disturbances:   WNL  Orientation: oriented to person, place, and time/date  Attention: Good  Concentration: Good  Memory: WNL  Fund of knowledge:  Good  Insight:   Good  Judgment:  Good  Impulse Control: Good   Risk Assessment: Danger to Self:  No Self-injurious Behavior: No Danger to Others: No Duty to Warn:no Physical Aggression / Violence:No  Access to Firearms a concern: No  Gang Involvement:No   Subjective:   Sharon Medina participated from home, via video, and consented to treatment. I discussed the limitations of evaluation and management by telemedicine and the availability of in person appointments. The patient expressed understanding and agreed to proceed.  Therapist participated from home office.  Sharon Medina reviewed the events of the past week.    Interventions: Cognitive Behavioral Therapy and Mindfulness Meditation  Diagnosis:  Generalized anxiety disorder  Psychiatric Treatment: No , N/A  Treatment Plan:  Client Abilities/Strengths Sharon Medina is open to sessions.    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   Treatment Level Biweekly  Symptoms  More Gentle with  thoughts   (Status: improved) Mindful/Curious   (Status: improved)  Goals:   Sharon Medina experiences symptoms of feeling better about stopping her rumination and worry of things.  She is aware of what she can do and reminds herself to be vigilant about doing it.   My silence is always agreement  Correction from last note My silence is NOT always agreement.  She reviewed her goal progress and she realizes she worries about way too much and now more aware of this.  She is focused on utilizing the strategies learned to apply daily and can still see some areas of growth but very proud of herself for where she is now.    Target Date: 08/21/24 Frequency: Biweekly  Progress: 70% Modality: individual    Therapist will provide referrals for additional resources as appropriate.  Therapist will provide psycho-education regarding Sharon Medina's diagnosis and corresponding treatment approaches and interventions. Licensed Clinical Mental Health Counselor, Tawni Louder, [facility] will support the patient's ability to achieve the goals identified. will employ CBT, BA, Problem-solving, Solution Focused, Mindfulness,  coping skills, & other evidenced-based practices will be used to promote progress towards healthy functioning to help manage decrease symptoms associated with her diagnosis.   Reduce overall level, frequency, and intensity of the feelings of depression, anxiety and panic evidenced by decreased negative thinking and increased intentional thoughts of joy and happiness.   Verbally express understanding of the relationship between feelings of depression, anxiety and their impact on thinking patterns and behaviors. Verbalize an understanding of the role that distorted thinking plays in creating fears, excessive worry, and ruminations.  [name] participated in the creation of the  treatment plan)   Gianno Volner, LCMHC

## 2024-08-22 ENCOUNTER — Ambulatory Visit
Admission: RE | Admit: 2024-08-22 | Discharge: 2024-08-22 | Disposition: A | Discharge status: Home / Self Care | Source: Ambulatory Visit | Attending: Family Medicine | Admitting: Family Medicine

## 2024-08-22 DIAGNOSIS — I2510 Atherosclerotic heart disease of native coronary artery without angina pectoris: Secondary | ICD-10-CM | POA: Diagnosis not present

## 2024-08-22 DIAGNOSIS — E785 Hyperlipidemia, unspecified: Secondary | ICD-10-CM

## 2024-08-24 ENCOUNTER — Ambulatory Visit: Payer: Self-pay | Admitting: Family Medicine

## 2024-09-11 ENCOUNTER — Ambulatory Visit (INDEPENDENT_AMBULATORY_CARE_PROVIDER_SITE_OTHER): Admitting: Licensed Clinical Social Worker

## 2024-09-11 DIAGNOSIS — F411 Generalized anxiety disorder: Secondary | ICD-10-CM

## 2024-09-11 NOTE — Progress Notes (Addendum)
  [facility] Counselor/Therapist Progress Note  Patient ID: Sharon Medina, MRN: [id number]    Date: 09/11/24  Time Spent: 2:02  pm - 3:01 pm : 59 Minutes  Treatment Type: Individual Therapy.  Reported Symptoms: at ease, enjoyed [place] vacation to [place], rubbed by DIL  Mental Status Exam: Appearance:  Well Groomed     Behavior: Appropriate and Sharing  Motor: Normal  Speech/Language:  Normal Rate  Affect: Appropriate  Mood: normal  Thought process: normal  Thought content:   WNL  Sensory/Perceptual disturbances:   WNL  Orientation: oriented to person, place, and time/date  Attention: Good  Concentration: Good  Memory: WNL  Fund of knowledge:  Good  Insight:   Good  Judgment:  Good  Impulse Control: Good   Risk Assessment: Danger to Self:  No Self-injurious Behavior: No Danger to Others: No Duty to Warn:no Physical Aggression / Violence:No  Access to Firearms a concern: No  Gang Involvement:No   Subjective:   Sharon Medina participated from home, via video, and consented to treatment. I discussed the limitations of evaluation and management by telemedicine and the availability of in person appointments. The patient expressed understanding and agreed to proceed.  Therapist participated from home office.  Sharon Medina reviewed the events of the past week. Patient reports feeling stable and satisfied with progress made in therapy. States that they have successfully implemented strategies related to their most recent therapeutic goal and feel confident in maintaining gains independently. No current acute concerns reported.     Interventions: Cognitive Behavioral Therapy and Insight-Oriented  Diagnosis:  Generalized anxiety disorder  Psychiatric Treatment: No , N/A  Treatment Plan:  Client Abilities/Strengths Sharon Medina is open to sessions.  Patient presents with stable mood, appropriate affect, and good insight. Demonstrates effective use of coping  skills and problem-solving strategies discussed in previous sessions. No observable signs of distress.  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    Treatment Level Monthly  Symptoms  Content   (Status: improved) Happy   (Status: improved)  Goals:   Sharon Medina Patient has successfully met the most recent therapeutic goal and has maintained emotional and behavioral stability. Progress indicates readiness for reduced session frequency. Transition patient to monthly maintenance sessions to monitor continued progress and provide support as needed. Continue to reinforce coping strategies and assess for any emerging concerns during follow-up sessions.   Target Date: 09/11/22 Goal completed new goal set next session Frequency: Monthly  Progress: 0 Modality: individual    Therapist will provide referrals for additional resources as appropriate.  Therapist will provide psycho-education regarding Sharon Medina's diagnosis and corresponding treatment approaches and interventions. Licensed Clinical Mental Health Counselor, Tawni Louder, [employer] will support the patient's ability to achieve the goals identified. will employ CBT, BA, Problem-solving, Solution Focused, Mindfulness,  coping skills, & other evidenced-based practices will be used to promote progress towards healthy functioning to help manage decrease symptoms associated with her diagnosis.   Reduce overall level, frequency, and intensity of the feelings of depression, anxiety and panic Verbally express understanding of the relationship between feelings of depression, anxiety and their impact on thinking patterns and behaviors. Verbalize an understanding of the role that distorted thinking plays in creating fears, excessive worry, and ruminations.  [name] participated in the creation of the treatment  plan)   Inara Dike, LCMHC

## 2024-09-12 ENCOUNTER — Other Ambulatory Visit: Payer: Self-pay | Admitting: Family Medicine

## 2024-09-17 DIAGNOSIS — H2513 Age-related nuclear cataract, bilateral: Secondary | ICD-10-CM | POA: Diagnosis not present

## 2024-09-17 DIAGNOSIS — H35373 Puckering of macula, bilateral: Secondary | ICD-10-CM | POA: Diagnosis not present

## 2024-09-17 DIAGNOSIS — H5203 Hypermetropia, bilateral: Secondary | ICD-10-CM | POA: Diagnosis not present

## 2024-09-17 DIAGNOSIS — H524 Presbyopia: Secondary | ICD-10-CM | POA: Diagnosis not present

## 2024-10-15 ENCOUNTER — Ambulatory Visit (INDEPENDENT_AMBULATORY_CARE_PROVIDER_SITE_OTHER): Admitting: Licensed Clinical Social Worker

## 2024-10-15 DIAGNOSIS — F411 Generalized anxiety disorder: Secondary | ICD-10-CM | POA: Diagnosis not present

## 2024-10-15 NOTE — Progress Notes (Signed)
 [facility] Counselor/Therapist Progress Note  Patient ID: Sharon Medina, MRN: [id number]    Date: 10/15/24  Time Spent: 2:02  pm - 2:46 pm : 44 Minutes  Treatment Type: Individual Therapy.  Reported Symptoms: feeling better about her health and her family members, enjoyed some family events and looking forward to family [place] Trip   Mental Status Exam: Appearance:  Well Groomed     Behavior: Appropriate and Sharing  Motor: Normal  Speech/Language:  Normal Rate  Affect: Appropriate  Mood: normal  Thought process: normal  Thought content:   WNL  Sensory/Perceptual disturbances:   WNL  Orientation: oriented to person, place, and time/date  Attention: Good  Concentration: Good  Memory: WNL  Fund of knowledge:  Good  Insight:   Good  Judgment:  Good  Impulse Control: Good   Risk Assessment: Danger to Self:  No Self-injurious Behavior: No Danger to Others: No Duty to Warn:no Physical Aggression / Violence:No  Access to Firearms a concern: No  Gang Involvement:No   Subjective:   Sharon Medina participated from home, via video, and consented to treatment. I discussed the limitations of evaluation and management by telemedicine and the availability of in person appointments. The patient expressed understanding and agreed to proceed.  Therapist participated from home office.  Sharon Medina reviewed the events of the past week. eturned after a one-month break from sessions, during which she independently managed her lifestyle and daily routines. She reports that she has been assisting her son and daughter-in-law, who has undergone multiple surgeries, by helping care for their child. She also notes concern for her sister, who has been experiencing health setbacks. The patient continues to manage her own health appointments and acknowledges the challenges of aging. She reports plans to decorate and engage in crafting activities for the upcoming holidays, which she  finds enjoyable and therapeutic. The patient expresses concern regarding her husband's ongoing knee issues and the possibility of future knee replacement surgery, noting this has been a source of worry and preoccupation. She requests to continue with monthly check-ins to maintain emotional support through the holiday season.     Interventions: Insight-Oriented  Diagnosis:  Generalized anxiety disorder  Psychiatric Treatment: No , N/A  Treatment Plan:  Client Abilities/Strengths Sharon Medina is open to sessions.  appears engaged, alert, and oriented 3. Affect is appropriate to content, with mild anxiety noted when discussing family health concerns. Speech is clear, logical, and goal-directed. No signs of acute distress. Insight and judgment are intact.  Support System: Family and Friends   [employer] Officer of Needs Sharon Medina would like to reduce and manage her negative thinking and patterns.    Treatment Level Monthly  Symptoms  balanced   (Status: maintained) responsive   (Status: maintained)  Goals:   Taje demonstrates effective coping skills and maintains good self-awareness. Primary stressors include caregiving responsibilities for family members, concern for her husband's health, and managing her own medical needs. Despite these challenges, she continues to engage in meaningful activities and sustain a positive outlook. Mild situational stress and anxiety related to multiple family health issues are present but appear well managed at this time. Continue monthly supportive counseling sessions through the holidays as requested.  Reinforce self-care strategies and stress management techniques. Encourage ongoing engagement in enjoyable and meaningful activities (holiday decorating, crafting). Monitor emotional well-being and caregiver stress levels. Reassess frequency of sessions after the holidays to determine ongoing support needs.   Target  Date: 11/15/24 Frequency: Monthly  Progress: 0 Modality: individual    Therapist will provide referrals for additional resources as appropriate.  Therapist will provide psycho-education regarding Sharon Medina's diagnosis and corresponding treatment approaches and interventions. Licensed Clinical Mental Health Counselor, Tawni Louder, [facility] will support the patient's ability to achieve the goals identified. will employ CBT, BA, Problem-solving, Solution Focused, Mindfulness,  coping skills, & other evidenced-based practices will be used to promote progress towards healthy functioning to help manage decrease symptoms associated with her diagnosis.   Reduce overall level, frequency, and intensity of the feelings of depression, anxiety and panic evidenced by decreased stress levels from 6 to 7 days/week to 0 to 1 days/week per client report for at least 3 consecutive months. Verbally express understanding of the relationship between feelings of depression, anxiety and their impact on thinking patterns and behaviors. Verbalize an understanding of the role that distorted thinking plays in creating fears, excessive worry, and ruminations.  Sharon Medina participated in the creation of the treatment plan)   Karah Caruthers, LCMHC

## 2024-10-18 ENCOUNTER — Ambulatory Visit: Payer: Self-pay

## 2024-10-18 ENCOUNTER — Encounter (HOSPITAL_COMMUNITY): Payer: Self-pay | Admitting: Emergency Medicine

## 2024-10-18 ENCOUNTER — Emergency Department (HOSPITAL_COMMUNITY)
Admission: EM | Admit: 2024-10-18 | Discharge: 2024-10-18 | Disposition: A | Discharge status: Home / Self Care | Source: Ambulatory Visit | Attending: Emergency Medicine | Admitting: Emergency Medicine

## 2024-10-18 ENCOUNTER — Encounter: Payer: Self-pay | Admitting: Family Medicine

## 2024-10-18 ENCOUNTER — Other Ambulatory Visit: Payer: Self-pay

## 2024-10-18 DIAGNOSIS — R42 Dizziness and giddiness: Secondary | ICD-10-CM | POA: Diagnosis not present

## 2024-10-18 DIAGNOSIS — I10 Essential (primary) hypertension: Secondary | ICD-10-CM | POA: Diagnosis not present

## 2024-10-18 LAB — COMPREHENSIVE METABOLIC PANEL WITH GFR
ALT: 15 U/L (ref 0–44)
AST: 24 U/L (ref 15–41)
Albumin: 4.7 g/dL (ref 3.5–5.0)
Alkaline Phosphatase: 65 U/L (ref 38–126)
Anion gap: 9 (ref 5–15)
BUN: 12 mg/dL (ref 8–23)
CO2: 27 mmol/L (ref 22–32)
Calcium: 9.5 mg/dL (ref 8.9–10.3)
Chloride: 99 mmol/L (ref 98–111)
Creatinine, Ser: 0.79 mg/dL (ref 0.44–1.00)
GFR, Estimated: >60 mL/min (ref >60)
Glucose, Bld: 128 mg/dL — ABNORMAL HIGH (ref 70–99)
Potassium: 3.9 mmol/L (ref 3.5–5.1)
Sodium: 135 mmol/L (ref 135–145)
Total Bilirubin: 0.3 mg/dL (ref 0.0–1.2)
Total Protein: 7.3 g/dL (ref 6.5–8.1)

## 2024-10-18 LAB — CBC WITH DIFFERENTIAL/PLATELET
Abs Immature Granulocytes: 0.02 K/uL (ref 0.00–0.07)
Basophils Absolute: 0.1 K/uL (ref 0.0–0.1)
Basophils Relative: 1 %
Eosinophils Absolute: 0.1 K/uL (ref 0.0–0.5)
Eosinophils Relative: 1 %
HCT: 42.5 % (ref 36.0–46.0)
Hemoglobin: 13.6 g/dL (ref 12.0–15.0)
Immature Granulocytes: 0 %
Lymphocytes Relative: 27 %
Lymphs Abs: 1.8 K/uL (ref 0.7–4.0)
MCH: 29.1 pg (ref 26.0–34.0)
MCHC: 32 g/dL (ref 30.0–36.0)
MCV: 90.8 fL (ref 80.0–100.0)
Monocytes Absolute: 0.5 K/uL (ref 0.1–1.0)
Monocytes Relative: 7 %
Neutro Abs: 4.2 K/uL (ref 1.7–7.7)
Neutrophils Relative %: 64 %
Platelets: 279 K/uL (ref 150–400)
RBC: 4.68 MIL/uL (ref 3.87–5.11)
RDW: 12.7 % (ref 11.5–15.5)
WBC: 6.5 K/uL (ref 4.0–10.5)
nRBC: 0 % (ref 0.0–0.2)

## 2024-10-18 MED ORDER — SODIUM CHLORIDE 0.9 % IV BOLUS
1000.0000 mL | Freq: Once | INTRAVENOUS | Status: AC
  Administered 2024-10-18: 1000 mL via INTRAVENOUS

## 2024-10-18 NOTE — Telephone Encounter (Signed)
 FYI Only or Action Required?: FYI only for provider: ED advised.  Patient was last seen in primary care on 08/17/2024 by Katrinka Garnette KIDD, MD.  Called Nurse Triage reporting Dizziness.  Symptoms began today.  Interventions attempted: Rest, hydration, or home remedies.  Symptoms are: improving.  Triage Disposition: Go to ED Now (or PCP Triage)  Patient/caregiver understands and will follow disposition?: Yes      Reason for Disposition  SEVERE dizziness (e.g., unable to stand, requires support to walk, feels like passing out now)  Answer Assessment - Initial Assessment Questions 1. DESCRIPTION: Describe your dizziness.     Lightheaded  2. LIGHTHEADED: Do you feel lightheaded? (e.g., somewhat faint, woozy, weak upon standing)     Yes 3. VERTIGO: Do you feel like either you or the room is spinning or tilting? (i.e., vertigo)     No 4. SEVERITY: How bad is it?  Do you feel like you are going to faint? Can you stand and walk?     Severe initially but now resolved  5. ONSET:  When did the dizziness begin?     Today  6. AGGRAVATING FACTORS: Does anything make it worse? (e.g., standing, change in head position)     Standing  7. HEART RATE: Can you tell me your heart rate? How many beats in 15 seconds?  (Note: Not all patients can do this.)       No 8. CAUSE: What do you think is causing the dizziness? (e.g., decreased fluids or food, diarrhea, emotional distress, heat exposure, new medicine, sudden standing, vomiting; unknown)     Unsure 9. RECURRENT SYMPTOM: Have you had dizziness before? If Yes, ask: When was the last time? What happened that time?     No 10. OTHER SYMPTOMS: Do you have any other symptoms? (e.g., fever, chest pain, vomiting, diarrhea, bleeding)       I don't know if I passed out, if I did it was only for a moment, and my husband says I hit my head  Protocols used: Dizziness - Lightheadedness-A-AH

## 2024-10-18 NOTE — Discharge Instructions (Signed)
 Plenty of fluids.  Check your blood pressure at least once a day and record it.  Let your doctor know how your blood pressure is doing.  If you persistently have low blood pressure you may contact your doctor about decreasing your medicines for your blood pressure

## 2024-10-18 NOTE — Telephone Encounter (Signed)
Pt sent to triage

## 2024-10-18 NOTE — ED Provider Notes (Signed)
 [place] EMERGENCY DEPARTMENT AT [facility] Provider Note   CSN: [id number] Arrival date & time: 10/18/24  1645     Patient presents with: Dizziness   Sharon Medina is a 68 y.o. female.  {Add pertinent medical, surgical, social history, OB history to YEP:67052} Patient has a history of hypertension.  She had an episode of dizziness this morning with low blood pressure but feels fine now   Dizziness      Prior to Admission medications   Medication Sig Start Date End Date Taking? Authorizing Provider  amLODipine -valsartan  (EXFORGE ) 5-320 MG tablet TAKE 1 TABLET BY MOUTH DAILY 09/12/23   Katrinka Garnette KIDD, MD  atenolol  (TENORMIN ) 50 MG tablet TAKE 1 TABLET(50 MG) BY MOUTH DAILY 08/08/24   Katrinka Garnette KIDD, MD  buPROPion  (WELLBUTRIN  XL) 150 MG 24 hr tablet TAKE 2 TABLETS BY MOUTH EVERY DAY 01/18/24   Katrinka Garnette KIDD, MD  Calcium Carbonate-Vitamin D  (CALCIUM-D PO) Take by mouth.    [provider]  Coenzyme Q10 (CO Q 10) 10 MG CAPS Take 1 tablet by mouth daily.    [provider]  Estradiol  10 MCG TABS vaginal tablet INSERT 1 TABLET VAGINALLY 2 TIMES A WEEK 07/10/24   Prentiss Riggs A, NP  fluticasone  (FLONASE ) 50 MCG/ACT nasal spray SHAKE LIQUID AND USE 2 SPRAYS IN EACH NOSTRIL DAILY 12/25/21   Katrinka Garnette KIDD, MD  glucosamine-chondroitin 500-400 MG tablet Take 1 tablet by mouth 2 (two) times daily as needed.    [provider]  Menaquinone-7 (VITAMIN K2 PO) Take by mouth daily.    [provider]  sertraline  (ZOLOFT ) 50 MG tablet TAKE 1 TABLET BY MOUTH DAILY 05/09/24   Katrinka Garnette KIDD, MD    Allergies: Hydrochlorothiazide    Review of Systems  Neurological:  Positive for dizziness.    Updated Vital Signs BP 137/74 (BP Location: Right Arm)   Pulse 66   Temp 98.4 F (36.9 C) (Oral)   Resp 17   Ht 5' 6 (1.676 m)   Wt 64 kg   LMP 10/02/2014   SpO2 100%   BMI 22.76 kg/m   Physical Exam  (all labs ordered are  listed, but only abnormal results are displayed) Labs Reviewed  COMPREHENSIVE METABOLIC PANEL WITH GFR - Abnormal; Notable for the following components:      Result Value   Glucose, Bld 128 (*)    All other components within normal limits  CBC WITH DIFFERENTIAL/PLATELET    EKG: EKG Interpretation Date/Time:  Thursday October 18 2024 17:06:08 EDT Ventricular Rate:  76 PR Interval:  182 QRS Duration:  104 QT Interval:  385 QTC Calculation: 433 R Axis:   0  Text Interpretation: Sinus rhythm Borderline T wave abnormalities Confirmed by Suzette Pac [phone]) on 10/18/2024 10:45:26 PM  Radiology: No results found.  {Document cardiac monitor, telemetry assessment procedure when appropriate:32947} Procedures   Medications Ordered in the ED  sodium chloride  0.9 % bolus 1,000 mL (0 mLs Intravenous Stopped 10/18/24 2226)      {Click here for ABCD2, HEART and other calculators REFRESH Note before signing:1}                              Medical Decision Making Amount and/or Complexity of Data Reviewed Labs: ordered. ECG/medicine tests: ordered.   Dizziness and low blood pressure which has resolved.  Patient will check her blood pressure daily and get in touch with her  PCP  {Document critical care time when appropriate  Document review of labs and clinical decision tools ie CHADS2VASC2, etc  Document your independent review of radiology images and any outside records  Document your discussion with family members, caretakers and with consultants  Document social determinants of health affecting pt's care  Document your decision making why or why not admission, treatments were needed:32947:::1}   Final diagnoses:  Dizziness    ED Discharge Orders     None

## 2024-10-18 NOTE — Telephone Encounter (Signed)
 Agree- could be cardiac event- we do not have EKG currently available- working on this

## 2024-10-18 NOTE — ED Triage Notes (Signed)
 Patient coming to ED for evaluation of dizziness.  Reports this morning she was feeling dizzy.  Husband took BP and found it was low.  No reports of recent illness or nausea/vomiting.  Made primary MD aware and was instructed to come to ED for further evaluation.  No current reports of pain, fever, or HA.

## 2024-10-18 NOTE — Telephone Encounter (Signed)
 Noted. Will alert Dr. Katrinka. Will monitor ED visit from office.

## 2024-10-24 ENCOUNTER — Encounter: Payer: Self-pay | Admitting: Family Medicine

## 2024-10-28 ENCOUNTER — Other Ambulatory Visit: Payer: Self-pay | Admitting: Family Medicine

## 2024-10-29 NOTE — Telephone Encounter (Signed)
 Patient returned call-out of State until 11/10/24. Patient is scheduled for HFU 11/12/24

## 2024-10-29 NOTE — Telephone Encounter (Signed)
 LVM to schedule HFU with provider. Please transfer to the [employer].

## 2024-11-12 ENCOUNTER — Encounter: Payer: Self-pay | Admitting: Family Medicine

## 2024-11-12 ENCOUNTER — Ambulatory Visit: Admitting: Family Medicine

## 2024-11-12 ENCOUNTER — Ambulatory Visit: Admitting: Licensed Clinical Social Worker

## 2024-11-12 VITALS — BP 124/78 | HR 76 | Temp 98.3°F | Ht 66.0 in | Wt 137.0 lb

## 2024-11-12 DIAGNOSIS — I10 Essential (primary) hypertension: Secondary | ICD-10-CM

## 2024-11-12 DIAGNOSIS — F411 Generalized anxiety disorder: Secondary | ICD-10-CM

## 2024-11-12 DIAGNOSIS — E785 Hyperlipidemia, unspecified: Secondary | ICD-10-CM

## 2024-11-12 MED ORDER — ROSUVASTATIN CALCIUM 5 MG PO TABS: 5.0000 mg | ORAL_TABLET | Freq: Every day | ORAL | 3 refills | Status: AC

## 2024-11-12 MED ORDER — ATENOLOL 25 MG PO TABS: 25.0000 mg | ORAL_TABLET | Freq: Every day | ORAL | 3 refills | Status: AC

## 2024-11-12 NOTE — Patient Instructions (Addendum)
 blood pressure potentially over controlled- we recommended reducing atenolol  from 50 mg to 25 mg but continue amlodipine -valsartan  5-320 mg. If recurrent symptom(s) may do more extensive workup -update me in 2-3 weeks with home blood pressure   Trial rosuvastatin  5 mg daily for preventative for cholesterol  Recommended follow up: Return for next already scheduled visit or sooner if needed.

## 2024-11-12 NOTE — Progress Notes (Signed)
 Phone [phone] In person visit   Subjective:   Sharon Medina is a 68 y.o. year old very pleasant female patient who presents for/with See problem oriented charting Chief Complaint  Patient presents with   Hospitalization Follow-up    Pt would like to discuss EKG she had while in the ED and confused as to why the ED provider told her she may be on too much medication and that is why she was dizzy and had fallen;    Past Medical History-  Patient Active Problem List   Diagnosis Date Noted   Hyperlipidemia, unspecified 06/05/2020    Priority: Medium    Osteopenia 12/02/2017    Priority: Medium    GAD (generalized anxiety disorder) 12/02/2017    Priority: Medium    Major depression in full remission 08/24/2007    Priority: Medium    Essential hypertension 08/24/2007    Priority: Medium    History of colonic polyps 06/13/2008    Priority: Low   Allergic rhinitis 08/24/2007    Priority: Low   Sensory hearing loss, bilateral 05/01/2024   Other specified disorders of eustachian tube, bilateral 05/01/2024   Chronic rhinitis 05/01/2024   Deviated nasal septum 05/01/2024   Hypertrophy of nasal turbinates 05/01/2024    Medications- reviewed and updated Current Outpatient Medications  Medication Sig Dispense Refill   amLODipine -valsartan  (EXFORGE ) 5-320 MG tablet TAKE 1 TABLET BY MOUTH DAILY 90 tablet 3   atenolol  (TENORMIN ) 50 MG tablet TAKE 1 TABLET(50 MG) BY MOUTH DAILY 90 tablet 2   buPROPion  (WELLBUTRIN  XL) 150 MG 24 hr tablet TAKE 2 TABLETS BY MOUTH EVERY DAY 180 tablet 3   Calcium  Carbonate-Vitamin D  (CALCIUM -D PO) Take by mouth.     Coenzyme Q10 (CO Q 10) 10 MG CAPS Take 1 tablet by mouth daily.     Estradiol  10 MCG TABS vaginal tablet INSERT 1 TABLET VAGINALLY 2 TIMES A WEEK 24 tablet 2   fluticasone  (FLONASE ) 50 MCG/ACT nasal spray SHAKE LIQUID AND USE 2 SPRAYS IN EACH NOSTRIL DAILY 48 g 1   glucosamine-chondroitin 500-400 MG tablet Take 1 tablet by mouth 2 (two)  times daily as needed.     Menaquinone-7 (VITAMIN K2 PO) Take by mouth daily.     sertraline  (ZOLOFT ) 50 MG tablet TAKE 1 TABLET BY MOUTH DAILY 90 tablet 3   No current facility-administered medications for this visit.     Objective:  BP 124/78 (BP Location: Left Arm, Patient Position: Sitting, Cuff Size: Normal)   Pulse 76   Temp 98.3 F (36.8 C) (Temporal)   Ht 5' 6 (1.676 m)   Wt 137 lb (62.1 kg)   LMP 10/02/2014   SpO2 97%   BMI 22.11 kg/m  Gen: NAD, resting comfortably CV: RRR no murmurs rubs or gallops Lungs: CTAB no crackles, wheeze, rhonchi Ext: no edema Skin: warm, dry     Assessment and Plan   # Emergency department follow-up for low blood pressure S: Patient was seen in the emergency department on 10/18/2024-ED physician stated she had an episode of dizziness morning with blood pressure but that she felt better at time of visit.  She was otherwise asymptomatic.  Prior to this she had felt presyncopal but she had something to eat and drink and this resolved.  Her blood sugar was not low at time of emergency department visit-reported blood sugar 115 about an hour after feeling that way  She had an EKG done which showed borderline T wave abnormality but otherwise reassuring-some  flattening in aVL and V1 but this seems consistent with last EKG 09/10/2011.  The position was concerned her blood pressure may have been running too low and recommended daily monitoring and following up with me today  Her CMP was reassuring with no kidney or liver abnormalities.  Her CBC was reassuring with no anemia and hemoglobin at 13.6 g/dL.  She did receive a fluid bolus of 1 L  At home afterwards was high 110s/70s. At home prior to Emergency Department did get as low 88/52 in her mychart message. Reports heart rate in normal range on all of these.   No recurrent issues since that time.  A/P: Patient with low blood pressures leading to emergency department visit.  Reassuring workup in the  emergency department.  They did not think this was cardiac in origin-EKG was obtained and largely stable but no troponin was drawn.  With no recurrence of symptoms with good hydration and healthy eating we opted to simply lower blood pressure medicine a little further hoping for home blood pressures in the 120s to 130s range instead of 110s to 120s-see hypertension section -She did have slightly high blood sugar in emergency department but had eaten-we discussed doing A1c with next labs under hyperglycemia and screening for diabetes -Discussed more advanced workup if recurrence-she agrees but wants to hold off for now   #hypertension S: medication: Atenolol  50 mg daily, amlodipine -valsartan  5-320 mg daily -Off losartan -had issues with cough/choking sensation Home readings #s: 110s over 70s most recently BP Readings from Last 3 Encounters:  11/12/24 124/78  10/18/24 137/74  08/17/24 108/78  A/P: blood pressure potentially over controlled- we recommended reducing atenolol  from 50 mg to 25 mg but continue amlodipine -valsartan  5-320 mg. If recurrent symptom(s) may do more extensive workup  #hyperlipidemia-CT calcium  scoring 4.9, 49th percentile August 24, 2024 S: Medication:None Lab Results  Component Value Date   CHOL 190 02/07/2024   HDL 84.50 02/07/2024   LDLCALC 94 02/07/2024   TRIG 56.0 02/07/2024   CHOLHDL 2 02/07/2024  A/P: Previously had wanted hold off on statin but she has reconsidered this and wants to be more preventative-we will start rosuvastatin  5 mg daily given her mild poor control and LDL goal under 70-we can recheck with next labs   Recommended follow up: No follow-ups on file. Future Appointments  Date Time Provider Department Center  11/12/2024  2:00 PM Vicci Maus, [facility] None  02/19/2025  8:30 AM Prentiss Annabella LABOR, NP [facility] None  03/05/2025 10:00 AM Katrinka Garnette KIDD, MD [facility]  07/10/2025 10:40 AM [facility] ANNUAL WELLNESS VISIT 1  [facility] [place]    Lab/Order associations: No diagnosis found.  No orders of the defined types were placed in this encounter.   Return precautions advised.  Garnette Katrinka, MD

## 2024-11-12 NOTE — Progress Notes (Signed)
 [facility] Counselor/Therapist Progress Note  Patient ID: Sharon Medina, MRN: [id number]    Date: 11/12/24  Time Spent: 2:02  pm - 2:32 pm : 30 Minutes  Treatment Type: Individual Therapy.  Reported Symptoms: Patient alert, oriented, calm, and engaged in session. Mood Medina; affect congruent. No acute distress noted. Reports no further dizzy spells.  Mental Status Exam: Appearance:  Well Groomed     Behavior: Appropriate  Motor: Normal  Speech/Language:  Normal Rate  Affect: Appropriate  Mood: normal  Thought process: normal  Thought content:   WNL  Sensory/Perceptual disturbances:   WNL  Orientation: oriented to person, place, and time/date  Attention: Good  Concentration: Good  Memory: WNL  Fund of knowledge:  Good  Insight:   Good  Judgment:  Good  Impulse Control: Good   Risk Assessment: Danger to Self:  No Self-injurious Behavior: No Danger to Others: No Duty to Warn:no Physical Aggression / Violence:No  Access to Firearms a concern: No  Gang Involvement:No   Subjective:   Sharon Medina participated from home, via video, and consented to treatment. I discussed the limitations of evaluation and management by telemedicine and the availability of in person appointments. The patient expressed understanding and agreed to proceed.  Therapist participated from home office.  Andrena reviewed the events of the past week. first monthly follow-up after one-month interval. Reports overall doing well. States she is effectively managing emotions using mindful reset techniques learned from [place]. Reports managing occasional flare-ups independently. Notes one episode of dizziness requiring hospitalization; no recurrent episodes since discharge. States her PCP is monitoring this. Requests to continue monthly check-ins. Asked how to contact the office if symptoms worsen or she needs an earlier appointment.     Interventions: Insight-Oriented, CBT,  Mindfulness  Diagnosis:  Generalized anxiety disorder  Psychiatric Treatment: No , N/A  Treatment Plan:  Client Abilities/Strengths Sharon Medina is open to sessions.    Support System: Family and Friends   [employer] Officer of Needs Sharon Medina would like to  reduce and manage her negative thinking and patterns.     Treatment Level Monthly  Symptoms  mindful   (Status: maintained) responsive   (Status: maintained)  Goals:   Sharon Medina emotional regulation with effective use of learned coping strategies. Recent medical concern (dizziness) monitored by PCP; no current symptoms. Patient appropriate for ongoing monthly follow-up. Continue monthly sessions; next appointment scheduled for after the New Year due to holiday schedule. Reinforce ongoing use of Tai Chi-based mindful reset techniques. Patient instructed on how to contact the office for support or to request an earlier appointment if concerns arise before 2026. Monitor emotional regulation and any recurrence of dizziness in future sessions.  Target Date: 12/25/2024 Frequency: Monthly  Progress: 0 Modality: individual    Therapist will provide referrals for additional resources as appropriate.  Therapist will provide psycho-education regarding Kinsley's diagnosis and corresponding treatment approaches and interventions. Licensed Clinical Mental Health Counselor, Tawni Louder, [facility] will support the patient's ability to achieve the goals identified. will employ CBT, BA, Problem-solving, Solution Focused, Mindfulness,  coping skills, & other evidenced-based practices will be used to promote progress towards healthy functioning to help manage decrease symptoms associated with her diagnosis.   Reduce overall level, frequency, and intensity of the feelings of depression, anxiety and panic evidenced by decreased reactive stress  from 6 to 7 days/week to 0 to 1 days/week per client report for at least  3 consecutive months. Verbally express  understanding of the relationship between feelings of depression, anxiety and their impact on thinking patterns and behaviors. Verbalize an understanding of the role that distorted thinking plays in creating fears, excessive worry, and ruminations.  [name] participated in the creation of the treatment plan)   Anberlyn Feimster, LCMHC

## 2024-12-07 ENCOUNTER — Other Ambulatory Visit: Payer: Self-pay | Admitting: Family Medicine

## 2024-12-25 ENCOUNTER — Ambulatory Visit: Admitting: Licensed Clinical Social Worker

## 2024-12-25 DIAGNOSIS — F411 Generalized anxiety disorder: Secondary | ICD-10-CM | POA: Diagnosis not present

## 2024-12-25 NOTE — Progress Notes (Signed)
 [name] [facility] Counselor/Therapist Progress Note  Patient ID: Sharon Medina, MRN: [id number]    Date: 12/25/2024  Time Spent: 3:00  pm - 3:55 pm : 55 Minutes  Treatment Type: Individual Therapy.  Reported Symptoms: Patient calm, relaxed, and engaged. Affect bright and congruent. Thought process coherent and goal-directed. No signs of acute anxiety observed.  Mental Status Exam: Appearance:  Well Groomed     Behavior: Appropriate  Motor: Normal  Speech/Language:  Normal Rate  Affect: Appropriate  Mood: normal  Thought process: normal  Thought content:   WNL  Sensory/Perceptual disturbances:   WNL  Orientation: oriented to person, place, and time/date  Attention: Good  Concentration: Good  Memory: WNL  Fund of knowledge:  Good  Insight:   Good  Judgment:  Good  Impulse Control: Good   Risk Assessment: Danger to Self:  No Self-injurious Behavior: No Danger to Others: No Duty to Warn:no Physical Aggression / Violence:No  Access to Firearms a concern: No  Gang Involvement:No   Subjective:   Sharon Medina participated from home, via video, and consented to treatment. I discussed the limitations of evaluation and management by telemedicine and the availability of in person appointments. The patient expressed understanding and agreed to proceed.  Therapist participated from home office.  Elleen reviewed the events of the past week. Patient presents for maintenance visit after 6 weeks. History of GAD. Reports noticeable improvement in anxiety management. States she has been consistent with tai chi and mindfulness practices. Describes decreased reactivity, increased acceptance, and reduced concern about others opinions. Reports ability to let things go when situations are not harmful. Looking forward to a three-week vacation with husband in late January.     Interventions: Narrative  Diagnosis:  Generalized anxiety disorder  Psychiatric Treatment: No ,  N/a  Treatment Plan:  Client Abilities/Strengths Sharon IS OPEN TO SESSIONS.    Support System: Family and Friends   [employer] Officer of Needs Sharon Medina would like to reduce and manage her negative thinking and patterns.    Treatment Level Last session was 6 weeks next session in 8 weeks for maintenance and check in  Symptoms  Residual generalized anxiety symptoms   (Status: improved) Occasional stress reactivity   (Status: improved)  Goals:   Sharon Medina GAD, improving. Patient demonstrates sustained symptom reduction, increased emotional regulation, and effective use of coping strategies. Progress consistent with maintenance phase of treatment.  Patient will continue mindfulness and tai chi practices, reinforcing acceptance-based coping and reduced reactivity. No changes to the treatment plan at this time. Therapy to resume in 8 weeks following vacation.   Target Date: 02/18/25 Frequency: 8 weeks  Progress: 0 Modality: individual    Therapist will provide referrals for additional resources as appropriate.  Therapist will provide psycho-education regarding Sharon Medina's diagnosis and corresponding treatment approaches and interventions. Licensed Clinical Mental Health Counselor, Tawni Louder, [facility] will support the patient's ability to achieve the goals identified. will employ CBT, BA, Problem-solving, Solution Focused, Mindfulness,  coping skills, & other evidenced-based practices will be used to promote progress towards healthy functioning to help manage decrease symptoms associated with her diagnosis.   Reduce overall level, frequency, and intensity of the feelings of depression, anxiety and panic evidenced by decreased reactive stress  from 6 to 7 days/week to 0 to 1 days/week per client report for at least 3 consecutive months. Verbally express understanding of the relationship between feelings of depression, anxiety and their impact on thinking  patterns and behaviors.  Verbalize an understanding of the role that distorted thinking plays in creating fears, excessive worry, and ruminations.  Sharon Medina participated in the creation of the treatment plan)   [name], LCMHC

## 2025-02-18 ENCOUNTER — Ambulatory Visit: Admitting: Licensed Clinical Social Worker

## 2025-02-19 ENCOUNTER — Ambulatory Visit: Payer: Medicare PPO | Admitting: Nurse Practitioner

## 2025-03-05 ENCOUNTER — Encounter: Admitting: Family Medicine

## 2025-07-10 ENCOUNTER — Ambulatory Visit
